# Patient Record
Sex: Female | Born: 1975 | Race: White | Hispanic: No | Marital: Single | Smoking: Former smoker
Health system: Southern US, Community
[De-identification: ages and names within clinical notes are randomized; demographics above are authoritative.]

## PROBLEM LIST (undated history)

## (undated) DIAGNOSIS — N61 Mastitis without abscess: Secondary | ICD-10-CM

## (undated) DIAGNOSIS — E039 Hypothyroidism, unspecified: Secondary | ICD-10-CM

## (undated) DIAGNOSIS — I10 Essential (primary) hypertension: Secondary | ICD-10-CM

## (undated) DIAGNOSIS — F988 Other specified behavioral and emotional disorders with onset usually occurring in childhood and adolescence: Secondary | ICD-10-CM

## (undated) DIAGNOSIS — N6019 Diffuse cystic mastopathy of unspecified breast: Secondary | ICD-10-CM

## (undated) DIAGNOSIS — M1990 Unspecified osteoarthritis, unspecified site: Secondary | ICD-10-CM

## (undated) HISTORY — DX: Diffuse cystic mastopathy of unspecified breast: N60.19

## (undated) HISTORY — DX: Unspecified osteoarthritis, unspecified site: M19.90

---

## 2003-04-25 HISTORY — PX: COLONOSCOPY: SHX174

## 2006-12-04 ENCOUNTER — Observation Stay (HOSPITAL_COMMUNITY): Admission: AD | Admit: 2006-12-04 | Discharge: 2006-12-07 | Payer: Self-pay | Admitting: Internal Medicine

## 2007-04-04 ENCOUNTER — Encounter: Admission: RE | Admit: 2007-04-04 | Discharge: 2007-04-04 | Payer: Self-pay | Admitting: Internal Medicine

## 2007-04-09 ENCOUNTER — Emergency Department (HOSPITAL_COMMUNITY): Admission: EM | Admit: 2007-04-09 | Discharge: 2007-04-09 | Payer: Self-pay | Admitting: Family Medicine

## 2007-05-05 ENCOUNTER — Emergency Department: Payer: Self-pay | Admitting: Emergency Medicine

## 2007-06-21 ENCOUNTER — Ambulatory Visit (HOSPITAL_COMMUNITY): Admission: RE | Admit: 2007-06-21 | Discharge: 2007-06-21 | Payer: Self-pay | Admitting: Internal Medicine

## 2007-11-26 ENCOUNTER — Ambulatory Visit (HOSPITAL_BASED_OUTPATIENT_CLINIC_OR_DEPARTMENT_OTHER): Admission: RE | Admit: 2007-11-26 | Discharge: 2007-11-26 | Payer: Self-pay | Admitting: Internal Medicine

## 2008-01-20 ENCOUNTER — Encounter: Admission: RE | Admit: 2008-01-20 | Discharge: 2008-01-20 | Payer: Self-pay | Admitting: Internal Medicine

## 2008-04-24 DIAGNOSIS — M1990 Unspecified osteoarthritis, unspecified site: Secondary | ICD-10-CM

## 2008-04-24 HISTORY — DX: Unspecified osteoarthritis, unspecified site: M19.90

## 2008-04-24 HISTORY — PX: INCISE AND DRAIN ABCESS: PRO64

## 2008-10-26 ENCOUNTER — Emergency Department (HOSPITAL_BASED_OUTPATIENT_CLINIC_OR_DEPARTMENT_OTHER): Admission: EM | Admit: 2008-10-26 | Discharge: 2008-10-26 | Payer: Self-pay | Admitting: Emergency Medicine

## 2008-10-29 ENCOUNTER — Emergency Department (HOSPITAL_BASED_OUTPATIENT_CLINIC_OR_DEPARTMENT_OTHER): Admission: EM | Admit: 2008-10-29 | Discharge: 2008-10-29 | Payer: Self-pay | Admitting: Emergency Medicine

## 2008-11-26 ENCOUNTER — Ambulatory Visit: Payer: Self-pay | Admitting: Internal Medicine

## 2008-11-29 ENCOUNTER — Ambulatory Visit: Payer: Self-pay | Admitting: Internal Medicine

## 2008-12-10 ENCOUNTER — Ambulatory Visit: Payer: Self-pay | Admitting: Surgery

## 2008-12-16 ENCOUNTER — Ambulatory Visit: Payer: Self-pay | Admitting: Surgery

## 2008-12-17 ENCOUNTER — Ambulatory Visit: Payer: Self-pay | Admitting: Surgery

## 2009-04-06 ENCOUNTER — Inpatient Hospital Stay (HOSPITAL_COMMUNITY): Admission: AD | Admit: 2009-04-06 | Discharge: 2009-04-06 | Payer: Self-pay | Admitting: Obstetrics & Gynecology

## 2009-04-06 ENCOUNTER — Ambulatory Visit: Payer: Self-pay | Admitting: Advanced Practice Midwife

## 2009-08-12 ENCOUNTER — Ambulatory Visit: Payer: Self-pay | Admitting: Surgery

## 2009-10-06 ENCOUNTER — Ambulatory Visit: Payer: Self-pay | Admitting: Diagnostic Radiology

## 2009-10-06 ENCOUNTER — Emergency Department (HOSPITAL_BASED_OUTPATIENT_CLINIC_OR_DEPARTMENT_OTHER): Admission: EM | Admit: 2009-10-06 | Discharge: 2009-10-06 | Payer: Self-pay | Admitting: Emergency Medicine

## 2009-10-28 ENCOUNTER — Ambulatory Visit: Payer: Self-pay | Admitting: Surgery

## 2010-01-16 ENCOUNTER — Emergency Department: Payer: Self-pay | Admitting: Internal Medicine

## 2010-04-24 DIAGNOSIS — N6019 Diffuse cystic mastopathy of unspecified breast: Secondary | ICD-10-CM

## 2010-04-24 HISTORY — PX: BREAST MASS EXCISION: SHX1267

## 2010-04-24 HISTORY — DX: Diffuse cystic mastopathy of unspecified breast: N60.19

## 2010-07-10 LAB — CBC
HCT: 37.6 % (ref 36.0–46.0)
MCHC: 35.1 g/dL (ref 30.0–36.0)
MCV: 94.9 fL (ref 78.0–100.0)
RDW: 11.4 % — ABNORMAL LOW (ref 11.5–15.5)

## 2010-07-10 LAB — DIFFERENTIAL
Basophils Absolute: 0 10*3/uL (ref 0.0–0.1)
Basophils Relative: 0 % (ref 0–1)
Eosinophils Absolute: 0 10*3/uL (ref 0.0–0.7)
Eosinophils Relative: 0 % (ref 0–5)
Lymphocytes Relative: 58 % — ABNORMAL HIGH (ref 12–46)
Lymphs Abs: 5.6 10*3/uL — ABNORMAL HIGH (ref 0.7–4.0)
Monocytes Relative: 8 % (ref 3–12)
Neutro Abs: 3.3 10*3/uL (ref 1.7–7.7)

## 2010-07-10 LAB — BASIC METABOLIC PANEL
BUN: 10 mg/dL (ref 6–23)
CO2: 21 mEq/L (ref 19–32)
Chloride: 110 mEq/L (ref 96–112)
Creatinine, Ser: 0.8 mg/dL (ref 0.4–1.2)
GFR calc non Af Amer: >60 mL/min (ref >60)
Potassium: 4.5 mEq/L (ref 3.5–5.1)

## 2010-07-10 LAB — POCT CARDIAC MARKERS: CKMB, poc: <1 ng/mL — ABNORMAL LOW (ref 1.0–8.0)

## 2010-07-17 ENCOUNTER — Ambulatory Visit: Payer: Self-pay | Admitting: Internal Medicine

## 2010-07-26 LAB — GC/CHLAMYDIA PROBE AMP, GENITAL
Chlamydia, DNA Probe: NEGATIVE
GC Probe Amp, Genital: NEGATIVE

## 2010-07-26 LAB — WET PREP, GENITAL
Clue Cells Wet Prep HPF POC: NONE SEEN
Yeast Wet Prep HPF POC: NONE SEEN

## 2010-07-26 LAB — HCG, QUANTITATIVE, PREGNANCY: hCG, Beta Chain, Quant, S: 12 m[IU]/mL — ABNORMAL HIGH (ref <5)

## 2010-07-26 LAB — URINE MICROSCOPIC-ADD ON

## 2010-07-26 LAB — CBC
Hemoglobin: 14.2 g/dL (ref 12.0–15.0)
RDW: 11.9 % (ref 11.5–15.5)

## 2010-07-26 LAB — URINALYSIS, ROUTINE W REFLEX MICROSCOPIC
Bilirubin Urine: NEGATIVE
pH: 5 (ref 5.0–8.0)

## 2010-08-11 ENCOUNTER — Ambulatory Visit: Payer: Self-pay | Admitting: General Surgery

## 2010-08-29 ENCOUNTER — Ambulatory Visit: Payer: Self-pay | Admitting: General Surgery

## 2010-09-06 NOTE — Discharge Summary (Signed)
Anita Aguirre, Anita Aguirre                 ACCOUNT NO.:  [id number]   MEDICAL RECORD NO.:  [id number]          PATIENT TYPE:  INP   LOCATION:  6740                         FACILITY:  [facility]   PHYSICIAN:  Jackie Plum, M.D.DATE OF BIRTH:  12-30-1975   DATE OF ADMISSION:  12/04/2006  DATE OF DISCHARGE:                               DISCHARGE SUMMARY   DIAGNOSES:  1. Hepatic adenoma.  This is an abdominal  pain thought to be      secondary to irritable bowel syndrome.  2. Obesity.  3. Gastroesophageal reflux disease.  4. Tobacco abuse.   DISCHARGE MEDICATIONS:  1. Prilosec 20 mg 2 tablets daily.  2. Bentyl 20 mg t.i.d. to q.i.d. p.r.n.  3. The patient has been instructed to discontinue her birth-control      pill.  4. Motrin 800 mg t.i.d. p.r.n.   STUDIES:  1. CT scan of the abdomen showed hypervascular left hepatic lesion      measuring 3.5 cm x 17.6 cm x 17.5 cm, without evidence of      cirrhosis, concerning for primary liver neoplasia or focal lymph      node hyperplasia.  This was done on December 04, 2006.  2. 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  The patient is planned for followup repeat MRI in      6 months to document stability.  3. EGD and colonoscopy done by Dr. Bosie Clos on December 06, 2006.  It      was unremarkable, except for vaginal irritation.  No Barrett's      esophagus was found, and per discussions with Dr. Bosie Clos, the      films are going to be reviewed again before this diagnosis is ruled      out in this patient, who has been previously diagnosed with      Barrett's esophagus by her physician.   DISCHARGE LABS:  WBC count 8.4, hemoglobin 12.6, hematocrit 45.6, MCV  92.2, platelet count 321,000.  Alpha-fetoprotein less than 1.3.   CONDITION ON DISCHARGE:  Improved satisfactorily.   REASON FOR ADMISSION:  Abdominal pain.   HISTORY OF  PRESENT ILLNESS:  Anita Aguirre is a 35 year old lady who has  been diagnosed with Barrett's esophagus.  She had been put on proton  pump inhibitor, but apparently had not been taking it because of the  cost.  She has some intermittent diarrhea with abdominal pain.  On  account of worsening and persistent symptoms, the patient was admitted  to the hospital for further evaluation.  On evaluation the patient had a  bowel workup done, and it was felt that her symptoms may be related to  irritable bowel syndrome.  The patient is planned for discharged today  on Prilosec over-the-counter, which she could possibly afford, instead  of Protonix or Nexium.  She is going to be following up with GI medicine  in 3 weeks.  On rounds today, the patient  denies any fever or chills.  Abdominal pain is better.   PHYSICAL EXAMINATION ON DISCHARGE:  VITAL SIGNS:  Her vital signs show a  blood pressure of 90/61.  Pulse 81.  Respirations 16.  Temperature 98.6  degrees F.  O2 sat 97% on room air.  HEENT: unremarkable.  LUNGS:  Clear to auscultation.  CARDIAC:  Regular, no gallops.  ABDOMEN:  Obese, soft, mild upper gastric and left subcostal tenderness.  No rebound tenderness.  EXTREMITIES:  No cyanosis or edema.  CNS:  Nonfocal.   discharged in stable condition. The patient's lab work and images as  well as endoscopy discussed with her.  Total time spent with the patient  in discharge process was more than 40 minutes      Jackie Plum, M.D.  Electronically Signed     GO/MEDQ  D:  12/07/2006  T:  12/07/2006  Job:  [id number]   cc:   Graylin Shiver, M.D.  Shirley Friar, MD

## 2010-09-06 NOTE — Op Note (Signed)
NAME:  Anita Aguirre, Anita Aguirre                 ACCOUNT NO.:  [id number]   MEDICAL RECORD NO.:  [id number]          PATIENT TYPE:  INP   LOCATION:  6740                         FACILITY:  [facility]   PHYSICIAN:  Shirley Friar, MDDATE OF BIRTH:  01-22-1976   DATE OF PROCEDURE:  DATE OF DISCHARGE:                               OPERATIVE REPORT   PROCEDURE:  Upper endoscopy.   INDICATIONS:  Diarrhea, abdominal pain, history of Barrett's.   MEDICATIONS FOR EGD:  100 mcg IV fentanyl, 10 mg IV of Versed.   FINDINGS:  Endoscope was inserted through the oropharynx and esophagus  intubated which was normal in its entirety.  Endoscope was advanced down  to the stomach which revealed normal stomach in the proximal and distal  portion.  Endoscope was advanced into the duodenal bulb and second  portion of the duodenum.  The second portion of the duodenum was normal.  The duodenal bulb had mild scattered areas of erythema consistent with  minimal duodenitis.  Endoscope was withdrawn to confirm the above  findings.   ASSESSMENT:  Minimal duodenitis, otherwise normal EGD.      Shirley Friar, MD  Electronically Signed     VCS/MEDQ  D:  12/06/2006  T:  12/07/2006  Job:  [id number]

## 2010-09-06 NOTE — Consult Note (Signed)
NAME:  Anita Aguirre, Anita Aguirre                 ACCOUNT NO.:  [id number]   MEDICAL RECORD NO.:  [id number]          PATIENT TYPE:  INP   LOCATION:  6740                         FACILITY:  [facility]   PHYSICIAN:  Shirley Friar, MDDATE OF BIRTH:  April 14, 1976   DATE OF CONSULTATION:  DATE OF DISCHARGE:                                 CONSULTATION   We were asked to see Anita Aguirre today in consultation for abdominal pain  and liver lesion on CT by Dr. Julio Sicks.   HISTORY OF PRESENT ILLNESS:  This is a 35 year old female with a history  of GERD and Barrett's esophagus, who is not on PPI therapy.  She was  found to have a 1.7 x 1.3-cm liver lesion concerning for neoplasm on CT.  The patient describes:  1. Diarrhea that is light brown in color x1 week.  It that appears to      have resolved, since she has been in the hospital.  She believes it      is resolved, due to her pain medications.  2. She describes as sharp pinching epigastric and left upper quadrant      pain that has been intermittent for a long time but has become      constant for the past 1 week.  She states that ranitidine sometimes      eases this pain.  3. Abdominal bloating after eating.   PAST MEDICAL HISTORY:  1. Barrett's esophagus diagnosed in 2004 by a Mining engineer.  The      patient is now a patient of Dr. Wandalee Ferdinand and was to be scheduled      for an endoscopy/colonoscopy next week.  2. Anxiety.  3. Obesity.  4. Tobacco abuse.   PRIMARY CARE PHYSICIAN:  Dr. Greggory Stallion Osei-Bonsu.   CURRENT MEDICATIONS:  Yasmin birth control pills.  She reports that she  has no NSAID exposure.   ALLERGIES:  SHE HAS ALLERGIES TO WELLBUTRIN WHICH CAUSES A RASH.   REVIEW OF SYSTEMS:  Significant for recent decrease in appetite but no  weight loss or fever.   SOCIAL HISTORY:  Positive for tobacco use, approximately a pack a day  for 15 years, negative for alcohol and drug use.   FAMILY HISTORY:  Is positive for liver cancer in her  maternal  grandmother, who was diagnosed at 54 years old and is still alive at 2.  Also positive for gallbladder disease in many relatives and positive for  cervical cancer in one cousin.   PHYSICAL EXAM:  GENERAL:  She is alert and oriented, in no apparent  distress.  SKIN:  Shows no jaundice.  HEENT:  Her eyes show no icterus.  HEART:  Has a regular rate and rhythm.  LUNGS:  Clear to auscultation.  ABDOMEN:  Obese but soft, tender in the left upper quadrant and  epigastrium.  She has good bowel sounds.   LABORATORY:  Her labs show a hemoglobin 12.8, hematocrit 36.6, white  count 9.3, platelets 331,000.  Chem-7 shows a BUN of 6, creatinine 0.84,  albumin 3.1.  Liver function tests are normal.  CT done yesterday shows  a liver lesion that is 1.7 x 1.3 cm on her left hepatic lobe.  It is  solitary and near the left portal vein.  Gallbladder on CT is normal.   ASSESSMENT:  Dr. Charlott Rakes has seen and examined the patient,  collected a history.  His impression is that this is a 35 year old  female with Barrett's esophagus, who is not on PPI therapy and who has  continued to smoke.  She is on Protonix b.i.d., now that she is in the  hospital.  She also has a solitary liver lesion on her left hepatic  lobe. for which an MRI is scheduled tomorrow.   PLAN:  Will schedule upper endoscopy on August 14.  Thank you very much  for this consultation.      Stephani Police, [place]      Shirley Friar, MD  Electronically Signed    MLY/MEDQ  D:  12/05/2006  T:  12/06/2006  Job:  [id number]   cc:   Jackie Plum, M.D.  Shirley Friar, MD

## 2010-09-06 NOTE — Op Note (Signed)
NAME:  Anita Aguirre, Anita Aguirre                 ACCOUNT NO.:  [id number]   MEDICAL RECORD NO.:  [id number]          PATIENT TYPE:  INP   LOCATION:  6740                         FACILITY:  [facility]   PHYSICIAN:  Shirley Friar, MDDATE OF BIRTH:  Jul 05, 1975   DATE OF PROCEDURE:  DATE OF DISCHARGE:                               OPERATIVE REPORT   PROCEDURE PERFORMED:  Colonoscopy.   INDICATION:  Diarrhea, abdominal pain.   MEDICATIONS:  Fentanyl 150 mcg IV, Versed 12.5 mg IV, additional  medicine given for preceding upper endoscopy.   FINDINGS:  Rectal exam was normal.  An adult Pentax colonoscope was  inserted into an adequately prepped colon and advanced to the cecum  where the ileocecal valve and appendiceal orifice were identified.  Careful withdrawal of the colonoscope revealed no mucosal abnormalities.  Retroflexion was unremarkable.   ASSESSMENT:  Normal colonoscopy.   PLAN:  Advance diet as tolerated.      Shirley Friar, MD  Electronically Signed     VCS/MEDQ  D:  12/06/2006  T:  12/07/2006  Job:  [id number]   cc:   Graylin Shiver, M.D.

## 2011-02-06 LAB — COMPREHENSIVE METABOLIC PANEL
Alkaline Phosphatase: 26 — ABNORMAL LOW
BUN: 6
CO2: 25
Chloride: 110
Creatinine, Ser: 0.84
GFR calc non Af Amer: >60
Glucose, Bld: 103 — ABNORMAL HIGH
Potassium: 4.2
Total Bilirubin: 0.6

## 2011-02-06 LAB — CBC
HCT: 36.6
HCT: 38.2
Hemoglobin: 12.8
Hemoglobin: 13.5
MCHC: 34.9
MCHC: 35.3
MCV: 92.2
MCV: 92.8
MCV: 92.9
Platelets: 321
Platelets: 331
Platelets: 343
RBC: 3.86 — ABNORMAL LOW
RBC: 3.94
RBC: 4.11
RDW: 11.5
RDW: 11.8
WBC: 8.4
WBC: 9.1
WBC: 9.3

## 2011-02-06 LAB — COMPREHENSIVE METABOLIC PANEL WITH GFR
ALT: 16
AST: 19
Albumin: 3.4 — ABNORMAL LOW
Alkaline Phosphatase: 26 — ABNORMAL LOW
BUN: 6
CO2: 24
Calcium: 9.2
Chloride: 110
Creatinine, Ser: 0.9
GFR calc non Af Amer: >60
Glucose, Bld: 89
Potassium: 3.8
Sodium: 140
Total Bilirubin: 0.2 — ABNORMAL LOW
Total Protein: 6.7

## 2011-02-06 LAB — PROTIME-INR
INR: 1
Prothrombin Time: 13.4

## 2011-02-06 LAB — APTT: aPTT: 29

## 2011-02-06 LAB — AFP TUMOR MARKER: AFP-Tumor Marker: <1.3

## 2011-04-25 HISTORY — PX: MASTECTOMY, PARTIAL: SHX709

## 2011-08-28 ENCOUNTER — Ambulatory Visit: Payer: Self-pay | Admitting: General Surgery

## 2011-08-30 ENCOUNTER — Ambulatory Visit: Payer: Self-pay | Admitting: General Surgery

## 2011-09-26 ENCOUNTER — Ambulatory Visit: Payer: Self-pay | Admitting: General Surgery

## 2011-09-29 ENCOUNTER — Ambulatory Visit: Payer: Self-pay | Admitting: Family Medicine

## 2011-11-27 ENCOUNTER — Emergency Department: Payer: Self-pay | Admitting: Emergency Medicine

## 2011-11-27 LAB — CBC WITH DIFFERENTIAL/PLATELET
Basophil %: 0.5 %
Eosinophil %: 2.5 %
HCT: 40.7 % (ref 35.0–47.0)
MCV: 94 fL (ref 80–100)
Monocyte %: 6.5 %
RDW: 12.7 % (ref 11.5–14.5)
WBC: 9.9 10*3/uL (ref 3.6–11.0)

## 2011-11-27 LAB — COMPREHENSIVE METABOLIC PANEL
Anion Gap: 8 (ref 7–16)
Calcium, Total: 9.5 mg/dL (ref 8.5–10.1)
Chloride: 112 mmol/L — ABNORMAL HIGH (ref 98–107)
Creatinine: 0.82 mg/dL (ref 0.60–1.30)
EGFR (African American): >60
Glucose: 92 mg/dL (ref 65–99)
Potassium: 4.4 mmol/L (ref 3.5–5.1)
SGOT(AST): 26 U/L (ref 15–37)

## 2011-11-27 LAB — URINALYSIS, COMPLETE
Bilirubin,UR: NEGATIVE
Glucose,UR: NEGATIVE mg/dL (ref 0–75)
Ketone: NEGATIVE
RBC,UR: 1 /HPF (ref 0–5)
Squamous Epithelial: 3
WBC UR: 6 /HPF (ref 0–5)

## 2011-11-27 LAB — HCG, QUANTITATIVE, PREGNANCY: Beta Hcg, Quant.: 15160 m[IU]/mL — ABNORMAL HIGH

## 2011-11-27 LAB — WET PREP, GENITAL

## 2011-11-27 LAB — LIPASE, BLOOD: Lipase: 169 U/L (ref 73–393)

## 2011-11-29 ENCOUNTER — Emergency Department: Payer: Self-pay | Admitting: Emergency Medicine

## 2011-11-29 LAB — CBC
HGB: 14.2 g/dL (ref 12.0–16.0)
Platelet: 314 10*3/uL (ref 150–440)
RBC: 4.31 10*6/uL (ref 3.80–5.20)
WBC: 12.5 10*3/uL — ABNORMAL HIGH (ref 3.6–11.0)

## 2011-12-15 ENCOUNTER — Emergency Department: Payer: Self-pay | Admitting: Internal Medicine

## 2011-12-15 LAB — URINALYSIS, COMPLETE
Bilirubin,UR: NEGATIVE
Ketone: NEGATIVE
RBC,UR: 728 /HPF (ref 0–5)
Specific Gravity: 1.013 (ref 1.003–1.030)
Squamous Epithelial: 5
Transitional Epi: <1
WBC UR: 22 /HPF (ref 0–5)

## 2011-12-15 LAB — CBC
HCT: 37.8 % (ref 35.0–47.0)
MCH: 32.4 pg (ref 26.0–34.0)
MCV: 94 fL (ref 80–100)
RBC: 4 10*6/uL (ref 3.80–5.20)
RDW: 12.7 % (ref 11.5–14.5)
WBC: 11.5 10*3/uL — ABNORMAL HIGH (ref 3.6–11.0)

## 2011-12-23 ENCOUNTER — Emergency Department: Payer: Self-pay | Admitting: Emergency Medicine

## 2011-12-23 LAB — HCG, QUANTITATIVE, PREGNANCY: Beta Hcg, Quant.: 62632 m[IU]/mL — ABNORMAL HIGH

## 2011-12-23 LAB — BASIC METABOLIC PANEL
Chloride: 109 mmol/L — ABNORMAL HIGH (ref 98–107)
Co2: 20 mmol/L — ABNORMAL LOW (ref 21–32)
Osmolality: 277 (ref 275–301)
Potassium: 3.9 mmol/L (ref 3.5–5.1)

## 2011-12-23 LAB — CBC
MCH: 32.8 pg (ref 26.0–34.0)
MCHC: 35 g/dL (ref 32.0–36.0)
Platelet: 356 10*3/uL (ref 150–440)
RBC: 3.63 10*6/uL — ABNORMAL LOW (ref 3.80–5.20)

## 2011-12-26 ENCOUNTER — Observation Stay: Payer: Self-pay | Admitting: Obstetrics and Gynecology

## 2011-12-26 LAB — CBC WITH DIFFERENTIAL/PLATELET
Lymphocyte #: 3.2 10*3/uL (ref 1.0–3.6)
MCH: 33.8 pg (ref 26.0–34.0)
MCV: 96 fL (ref 80–100)
Neutrophil %: 73.4 %
Platelet: 337 10*3/uL (ref 150–440)
RDW: 13.2 % (ref 11.5–14.5)
WBC: 17.2 10*3/uL — ABNORMAL HIGH (ref 3.6–11.0)

## 2011-12-29 LAB — PATHOLOGY REPORT

## 2012-01-05 ENCOUNTER — Ambulatory Visit: Payer: Self-pay | Admitting: Family Medicine

## 2012-01-11 ENCOUNTER — Ambulatory Visit: Payer: Self-pay | Admitting: Family Medicine

## 2012-01-16 DIAGNOSIS — F4310 Post-traumatic stress disorder, unspecified: Secondary | ICD-10-CM | POA: Insufficient documentation

## 2012-01-18 ENCOUNTER — Ambulatory Visit: Payer: Self-pay | Admitting: Family Medicine

## 2012-02-08 ENCOUNTER — Ambulatory Visit: Payer: Self-pay | Admitting: Family Medicine

## 2012-05-19 ENCOUNTER — Ambulatory Visit: Payer: Self-pay

## 2012-05-19 LAB — COMPREHENSIVE METABOLIC PANEL
Anion Gap: 12 (ref 7–16)
BUN: 8 mg/dL (ref 7–18)
Bilirubin,Total: 0.3 mg/dL (ref 0.2–1.0)
Calcium, Total: 9.3 mg/dL (ref 8.5–10.1)
Chloride: 105 mmol/L (ref 98–107)
Glucose: 94 mg/dL (ref 65–99)
Osmolality: 283 (ref 275–301)
Potassium: 3.8 mmol/L (ref 3.5–5.1)
SGPT (ALT): 22 U/L (ref 12–78)
Sodium: 143 mmol/L (ref 136–145)

## 2012-05-19 LAB — CBC WITH DIFFERENTIAL/PLATELET
Basophil #: 0.1 10*3/uL (ref 0.0–0.1)
Eosinophil #: 0.3 10*3/uL (ref 0.0–0.7)
HGB: 12.9 g/dL (ref 12.0–16.0)
Lymphocyte #: 2.8 10*3/uL (ref 1.0–3.6)
Neutrophil %: 64.4 %
RBC: 4.09 10*6/uL (ref 3.80–5.20)

## 2012-05-19 LAB — URINALYSIS, COMPLETE
Glucose,UR: NEGATIVE mg/dL (ref 0–75)
Ketone: NEGATIVE
Ph: 6.5 (ref 4.5–8.0)

## 2012-06-28 ENCOUNTER — Encounter: Payer: Self-pay | Admitting: *Deleted

## 2012-07-20 ENCOUNTER — Encounter: Payer: Self-pay | Admitting: General Surgery

## 2012-09-05 IMAGING — US US OB < 14 WEEKS - US OB TV
1 series · 13 of 28 positions shown · non-contrast
Comparison: none

REASON FOR EXAM: pt in severe pain, left pelvis - rule out ectopic -
report of 10 wks pregnanct
COMMENTS:

[Series 1: us ob < 14 weeks - us ob tv · 0.31mm/px · 78 acquisitions, 13 frames shown]
[im 3/78]
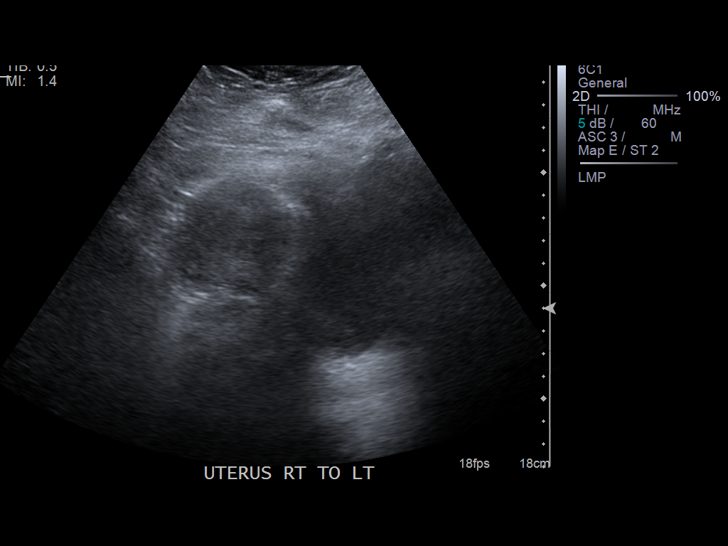
[im 9/78]
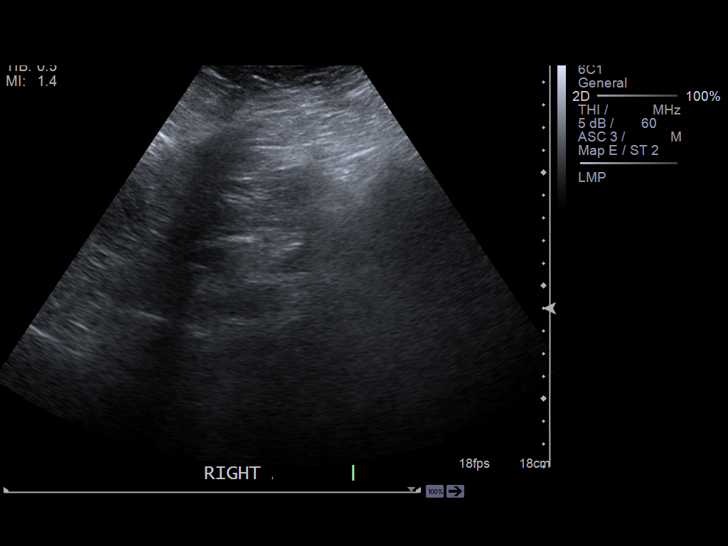
[im 15/78]
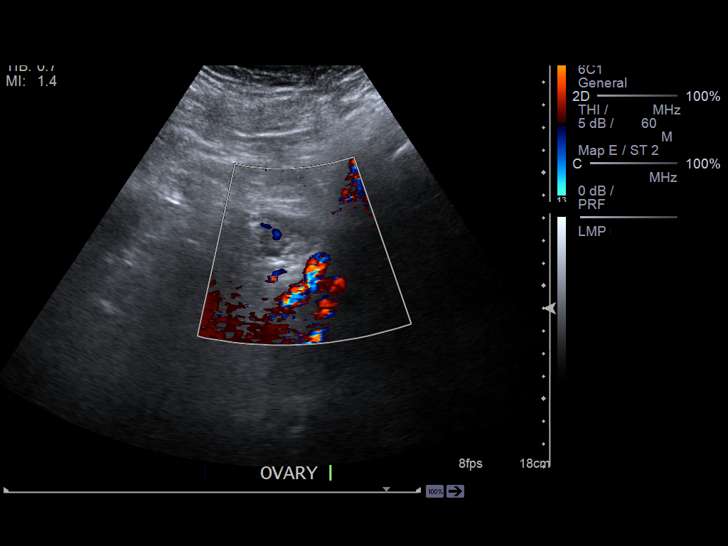
[im 20/78]
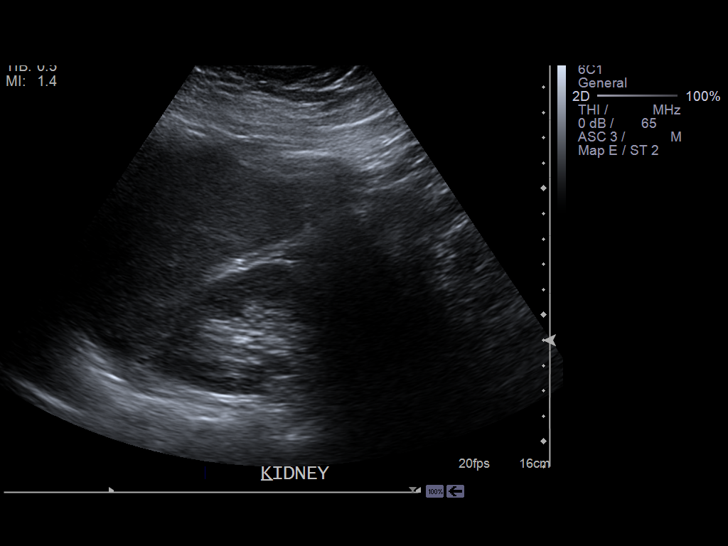
[im 26/78]
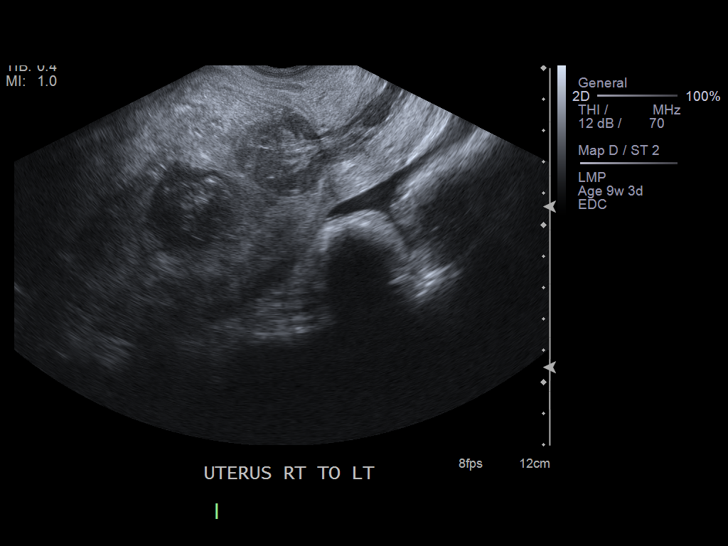
[im 32/78]
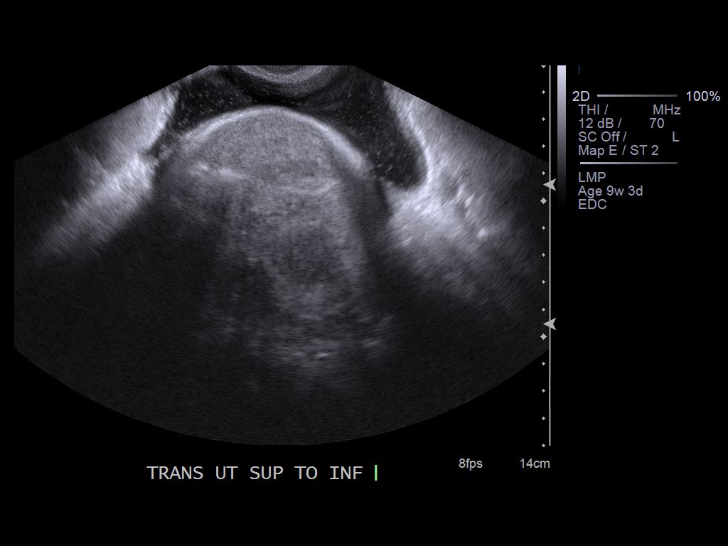
[im 40/78]
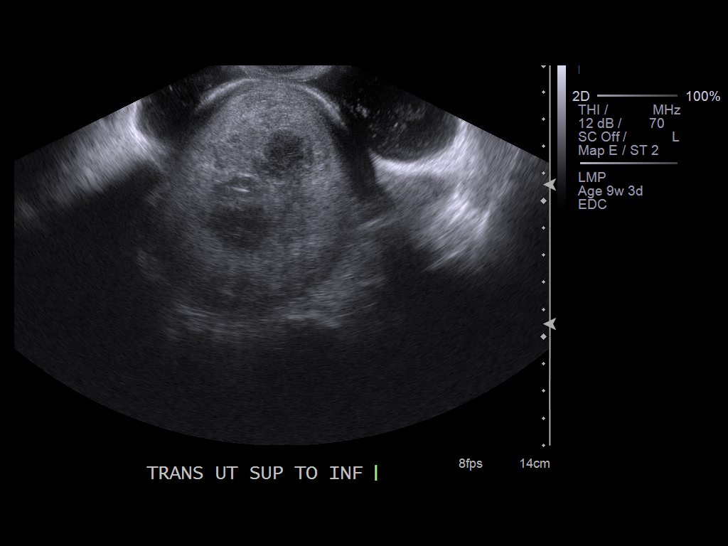
[im 46/78]
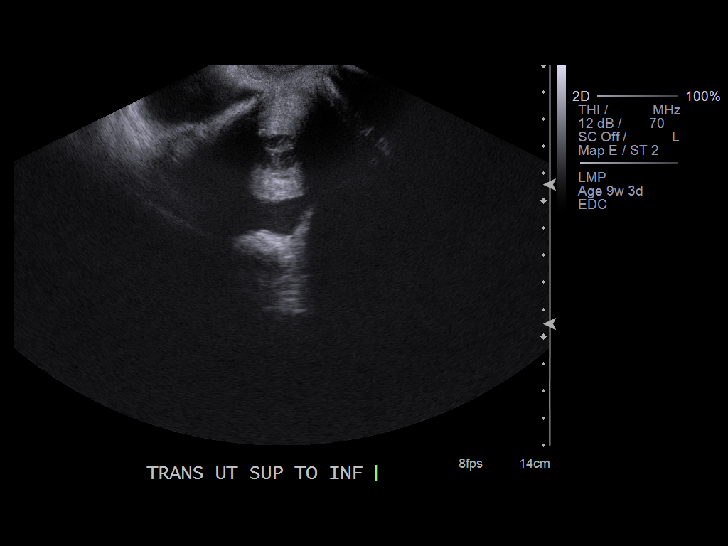
[im 52/78]
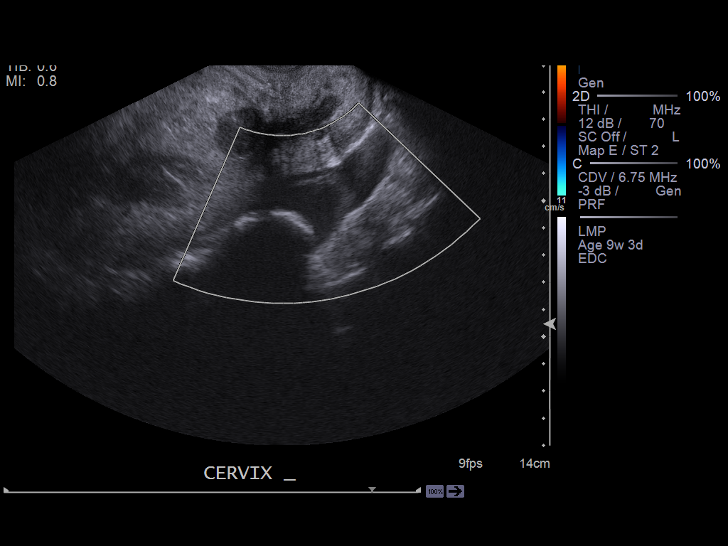
[im 58/78]
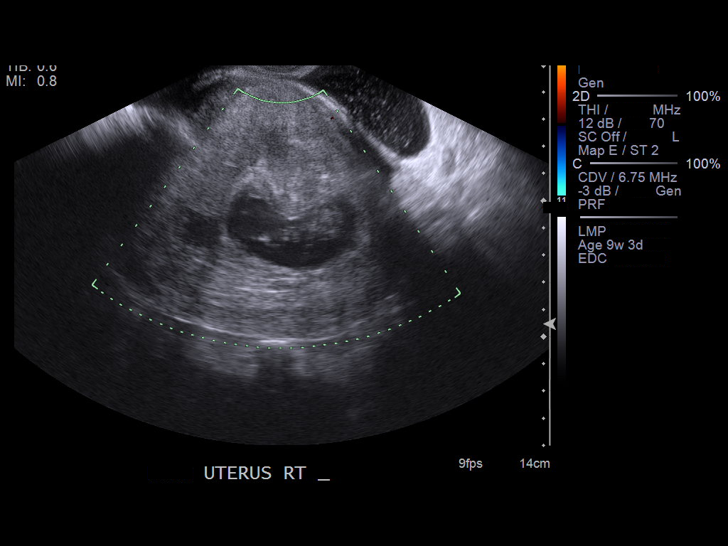
[im 63/78]
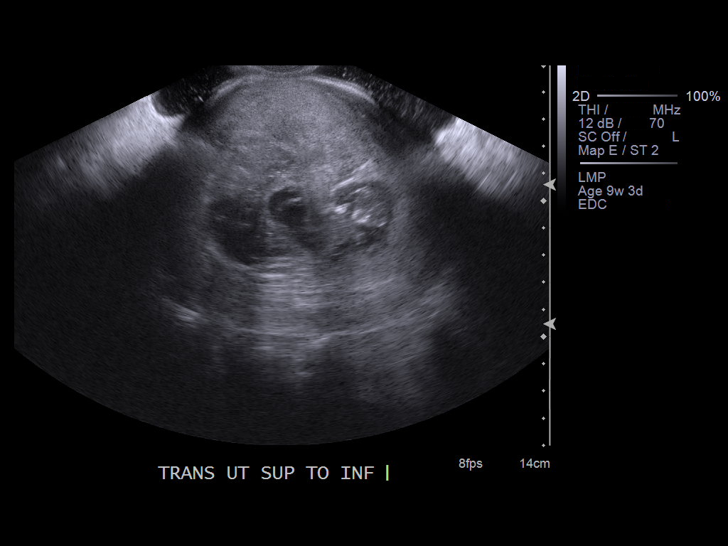
[im 69/78]
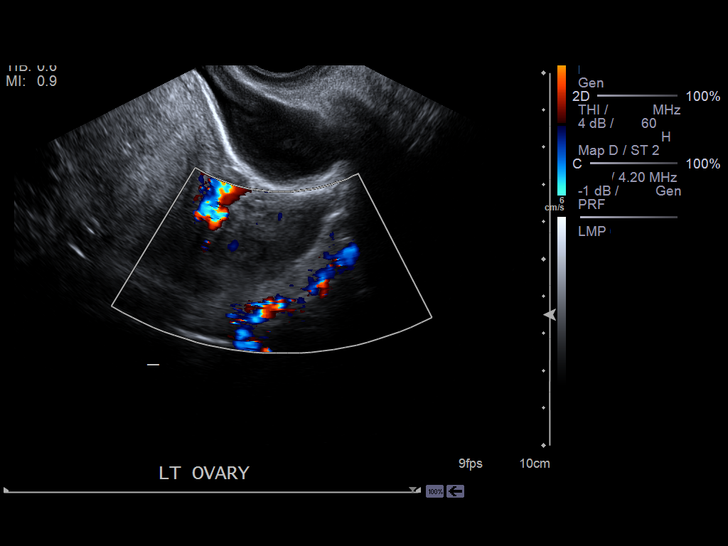
[im 75/78]
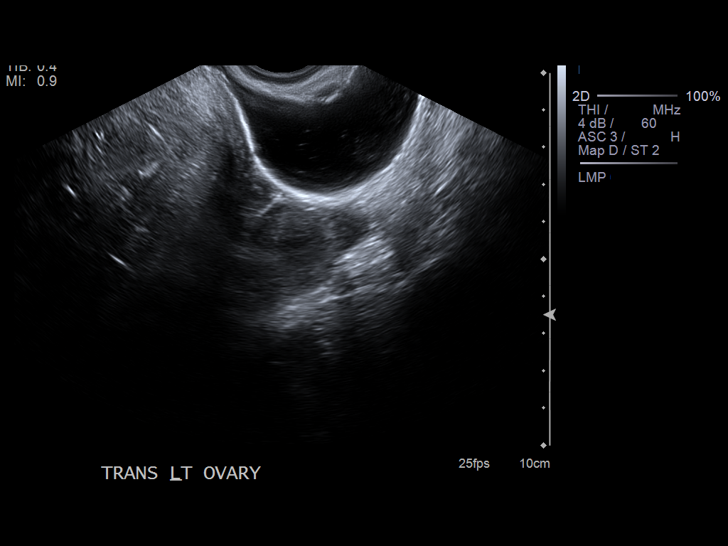

[13 of 28 positions shown; findings below may reference images not displayed]

PROCEDURE:     US  - US OB LESS THAN 14 WEEKS/W TRANS  - December 23, 2011  [DATE]

RESULT:     Doppler interrogation utilizing an OB protocol demonstrates an
intrauterine gestational sac with a single fetal pole. Left ovary measures
3.39 x 2.09 x 2.71 cm. The uterus measures 13.67 x 9.20 x 10.66 cm. Drains
vaginal images show the left ovary 3.30 x 2.37 x 4.61 cm and shows blood
flow present on color and spectral Doppler with arterial and venous signals
demonstrated. There is a small cyst or follicle in the left ovary which is
not measured. Fetal activity is seen the heart rate of 157 beats per minute.
On the right side of the endometrial cavity toward the cervix there is a
4.02 x 2.62 x 3.25 cm complex collection a separate area is seen and
measures 2.88 x 2.54 x 3.06 cm. The right ovary is not visualized. The
maternal kidneys show no obstruction or stones. There is no evidence of an
ectopic gestation. No uterine mass is appreciated. The previous examination
performed on 27 November, 2011 showed a single intrauterine gestation. The left
ovarian cystic area is unchanged. The report previously erroneously said
this was on the right. Right ovary is not seen on today's exam.
IMPRESSION: 1. Single intrauterine gestation measuring 10 weeks 2 days by fetal
measurements with an ultrasound EDC 18 July, 2012 and a current fetal
heart rate of 157 beats per minute. Stable left ovarian cyst.
2. Two complex areas of predominantly decreased but mixed echotexture along
the right side of the endometrial cavity extending toward the cervix. These
are nonspecific and could represent areas of hemorrhage. Nonviable previous
gestations are not completely excluded but there is no previous viable
gestation in these regions on previous exam. Followup is recommended,
possibly a high risk OB clinic.

[REDACTED]

## 2012-09-12 ENCOUNTER — Ambulatory Visit: Payer: Self-pay | Admitting: General Surgery

## 2012-09-19 ENCOUNTER — Encounter: Payer: Self-pay | Admitting: *Deleted

## 2013-02-21 IMAGING — US ABDOMEN ULTRASOUND
1 series · 14 of 25 positions shown · non-contrast
Comparison: none

REASON FOR EXAM: Abn CT pancreas or bile duct enlargement
COMMENTS:

[Series 1: abdomen ultrasound · 0.30mm/px · 14 of 87 slices shown]
[im 1/87]
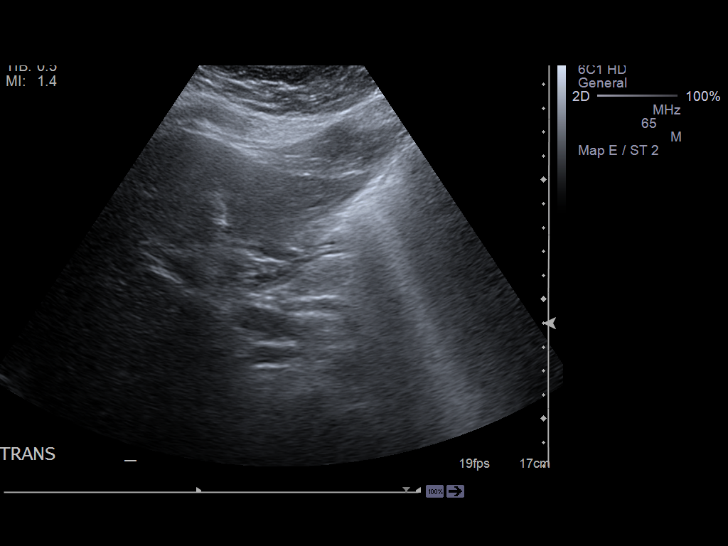
[im 8/87]
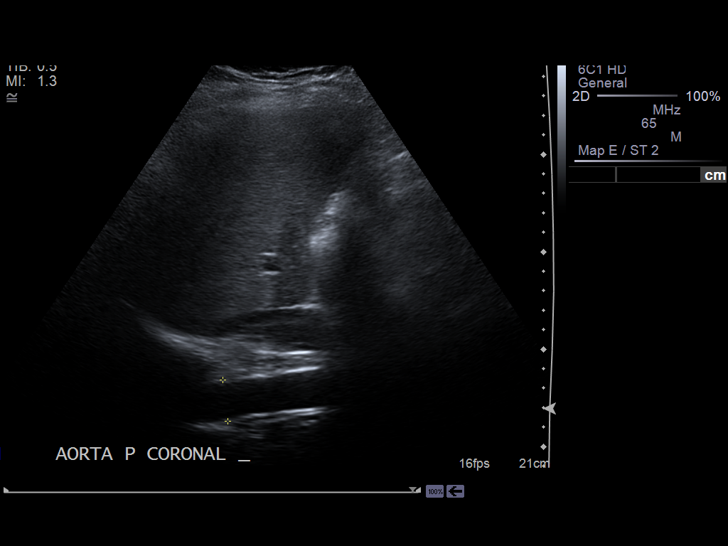
[im 15/87]
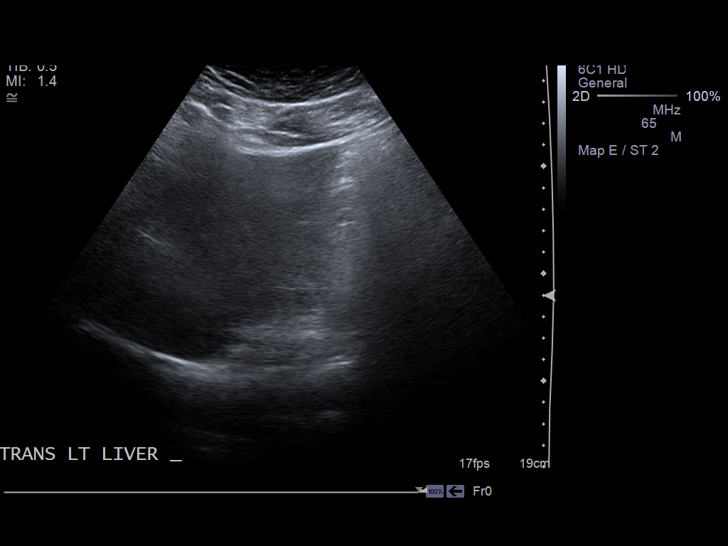
[im 22/87]
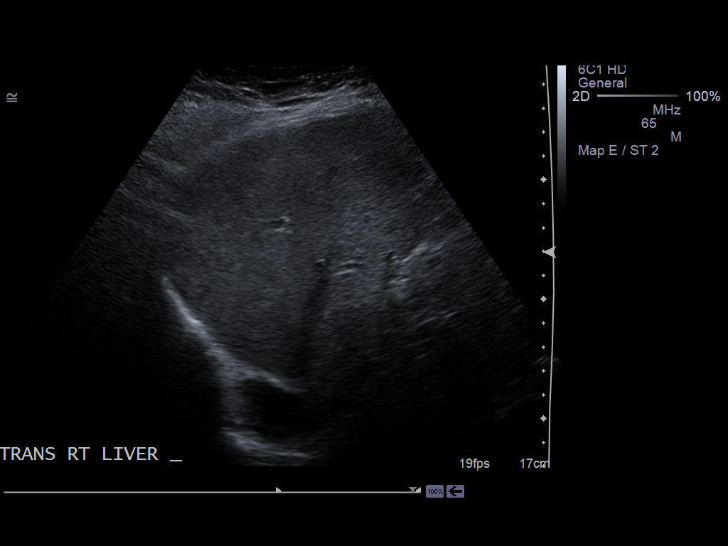
[im 29/87]
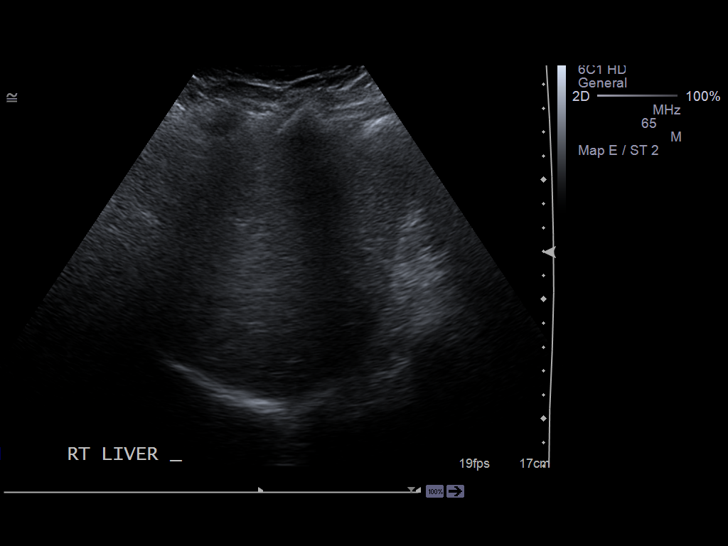
[im 33/87]
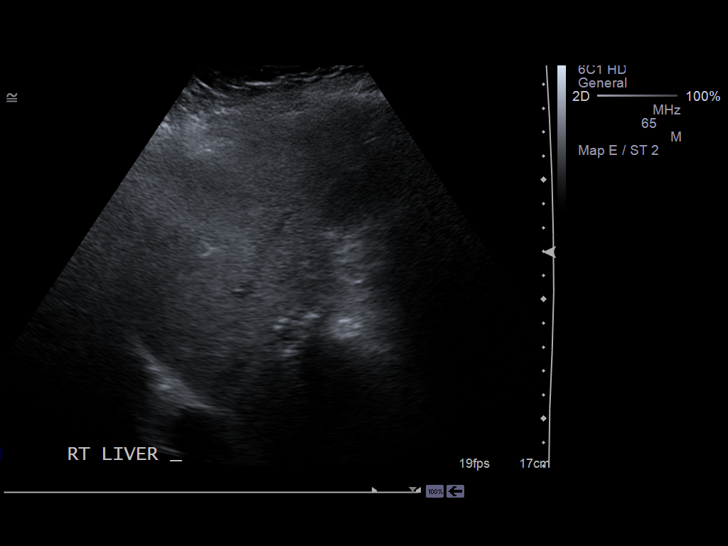
[im 40/87]
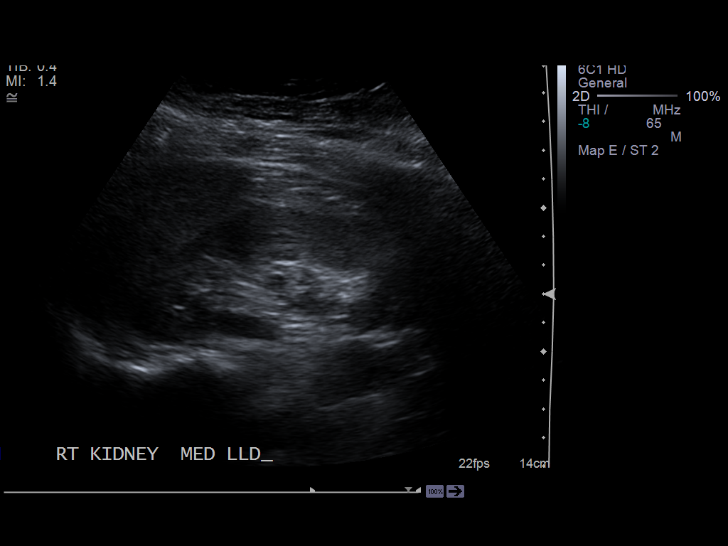
[im 47/87]
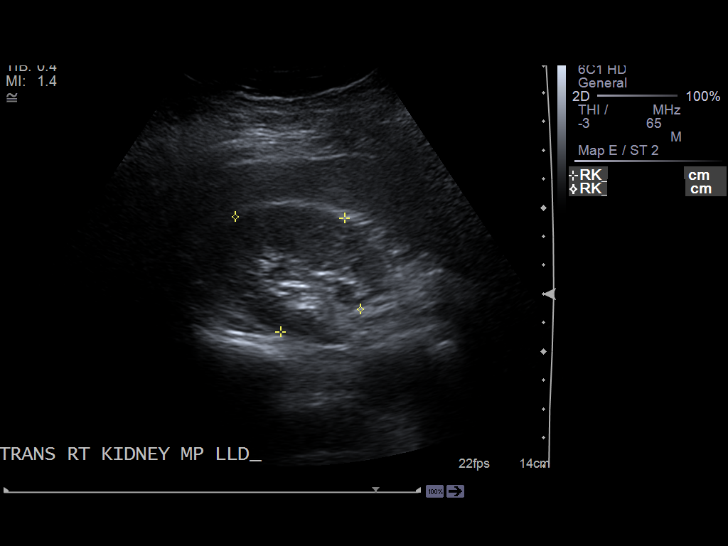
[im 54/87]
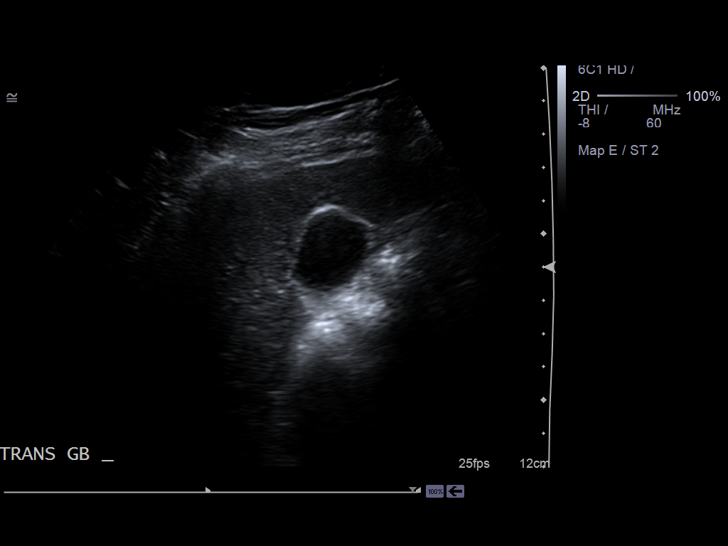
[im 58/87]
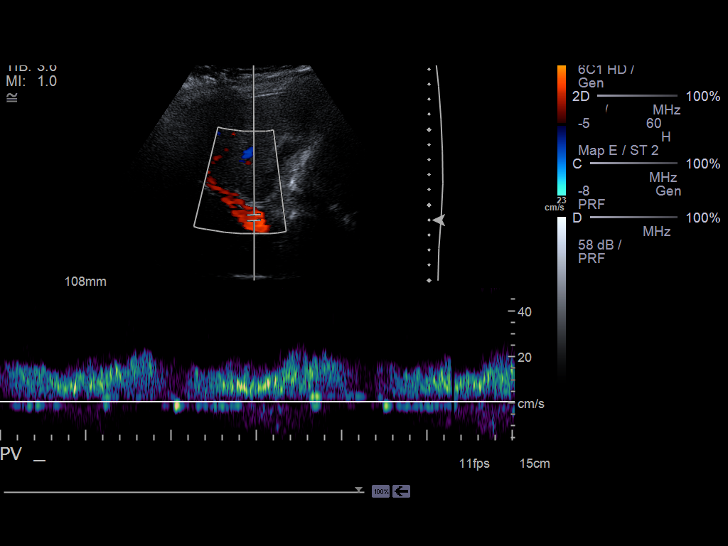
[im 65/87]
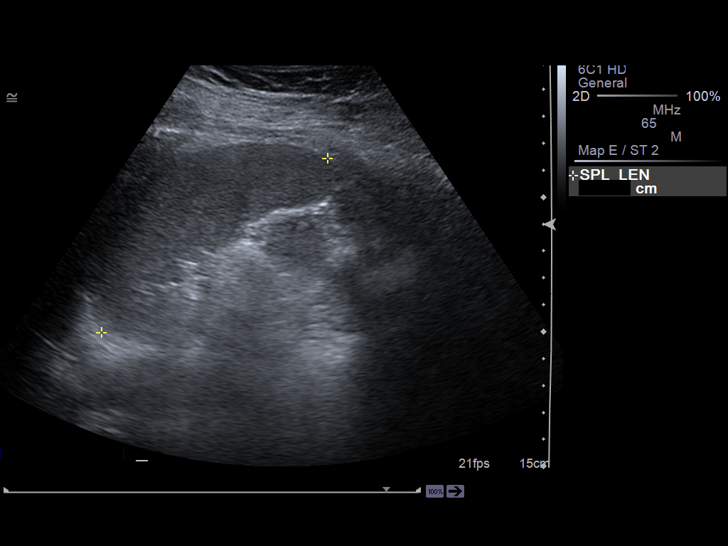
[im 72/87]
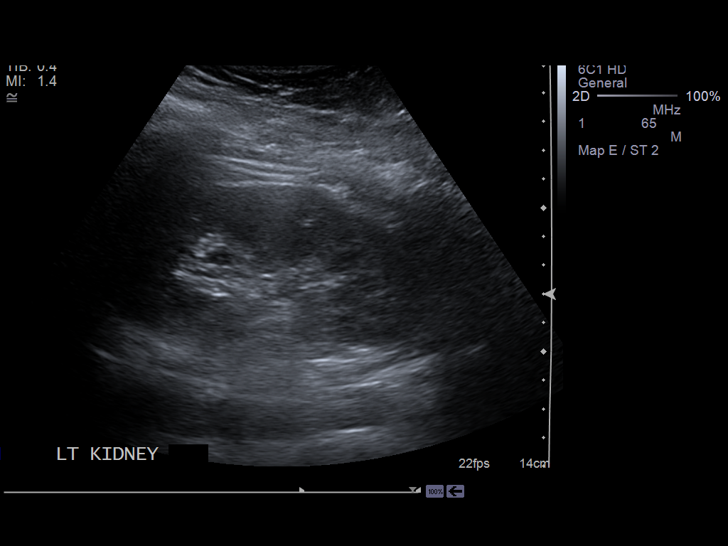
[im 79/87]
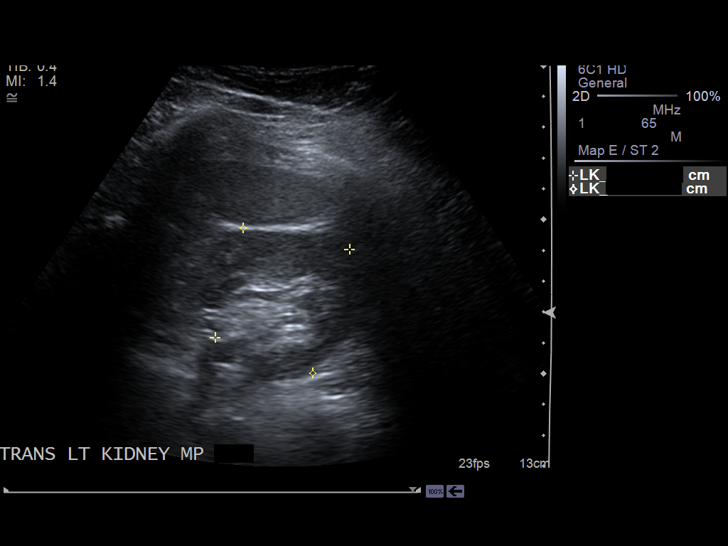
[im 87/87]
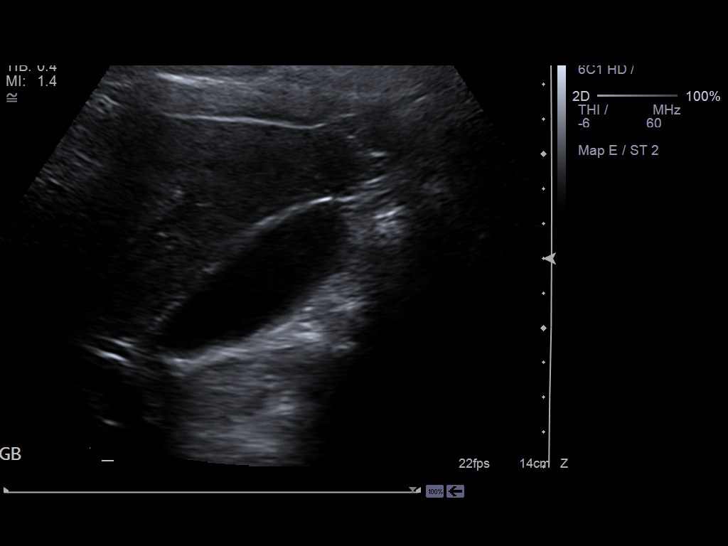

[14 of 25 positions shown; findings below may reference images not displayed]

PROCEDURE:     US  - US ABDOMEN GENERAL SURVEY  - January 11, 2012 [DATE]

RESULT:     Abdominal sonogram is performed. The pancreas is poorly seen.
The visualized aorta proximally appears normal. The mid to distal aorta is
normal in appearance. The midportion is not as well seen as the proximal and
distal portions. The liver shows some mild heterogeneity without a discrete
mass. The liver length is 15.10 cm. Portal venous flow is normal. No
gallstones are evident. There is no sonographic Murphy's sign. The
gallbladder wall thickness is 2.1 mm. Common bile duct diameter is 4.5 mm.
The inferior vena cava is unremarkable. The kidneys appear normal. The right
kidney measures 12.22 x 4.54 x 5.41 cm. The left kidney measures 11.92 x
5.22 x 5.21 cm. The spleen measures up to 10.61 cm.
IMPRESSION: Normal-appearing abdominal sonogram with limited visualization of the
pancreas. Mild fatty infiltration is noted in the liver.

[REDACTED]

## 2013-04-10 ENCOUNTER — Emergency Department: Payer: Self-pay | Admitting: Emergency Medicine

## 2013-04-10 ENCOUNTER — Ambulatory Visit: Payer: Self-pay

## 2014-02-23 ENCOUNTER — Encounter: Payer: Self-pay | Admitting: *Deleted

## 2014-08-11 NOTE — H&P (Signed)
PATIENT NAME:  Anita Aguirre, Anita Aguirre MR#:  [id number] DATE OF BIRTH:  [birth year]  DATE OF ADMISSION:  12/26/2011  CHIEF COMPLAINT: Incomplete abortion.   HISTORY: Anita Aguirre is a 39 year old white female gravida 3, para 0-0-2-0, who had a spontaneous abortion with passage of a fetus 40 hours ago, presents now with anemia symptoms, persistent mild bleeding, and pelvic pain. It is suspected that she has an incomplete abortion and will likely need dilation and curettage. CBC earlier today revealed a hemoglobin of 8.2 and a hematocrit of 24.3.   PAST MEDICAL HISTORY: Hypertension.   PAST SURGICAL HISTORY:  1. Partial mastectomy.  2. Breast biopsy x3 for chronic abscess.   PAST OB HISTORY: Para 0-0-2-0, SAB x1, TAB x1.   FAMILY HISTORY: Negative for cancer of the breast, colon or ovary. No family history of genetic disorders.   SOCIAL HISTORY: Patient denies alcohol use. She is a smoker, 1 pack of cigarettes a day for 20 years. She denies drug use.   DRUG ALLERGIES: Zyban.   CURRENT MEDICATIONS:  1. Prenatal vitamins. 2. Methergine. 3. Ibuprofen. 4. Vicodin.   PHYSICAL EXAMINATION:  VITAL SIGNS: Height 5 feet 7 inches, weight 212.9 pounds. Blood pressure 121/80, heart rate 92.   GENERAL: Patient is a tearful white female who is alert and oriented. She has overall pale appearance.   SKIN: Warm and dry. Nail beds show fair capillary refill.   NECK: Supple. There is no thyromegaly or adenopathy.   LUNGS: Clear.   HEART: Mild tachycardia with no murmur. No S3 or S4.   ABDOMEN: Soft and nontender. There is no organomegaly.   PELVIC: External genitalia with some old blood on the perineum. BUS normal. Vagina has some burgundy clot in the vault. The cervix is open to ring forceps and small amount of clot is removed. Bimanual exam reveals a 12 week size uterus that is slightly tender. No adnexal masses are appreciated.   EXTREMITIES: Warm and dry. No clubbing, cyanosis, or edema.    IMPRESSION:  1. Incomplete abortion.  2. Anemia from acute blood loss from miscarriage.   PLAN: Admission to hospital for observation with subsequent dilation and curettage to be done in the a.m.    CONSENT NOTE: Patient has been counseled regarding the suction dilatation and curettage procedure. She is understanding of the planned procedure and is aware of and is accepting of all surgical risks which include but are not limited to bleeding, infection, pelvic organ injury with need for repair, blood clot disorders, anesthesia risks, uterine perforation, death. All questions are answered. Informed consent is given. Patient is ready and willing to proceed with the surgery as scheduled. ____________________________ Alanda Slim Reyes Fifield, MD mad:cms D: 12/26/2011 22:21:04 ET T: 12/27/2011 08:34:15 ET  JOB#: [id number] Hassell Done A Yolando Gillum MD ELECTRONICALLY SIGNED 12/28/2011 13:45

## 2014-08-11 NOTE — Op Note (Signed)
PATIENT NAME:  Anita Aguirre, Anita Aguirre MR#:  [id number] DATE OF BIRTH:  1975/04/28  DATE OF PROCEDURE:  12/27/2011  PREOPERATIVE DIAGNOSES:  1. Incomplete abortion.  2. Anemia.   POSTOPERATIVE DIAGNOSES:  1. Incomplete abortion.  2. Anemia.   OPERATIVE PROCEDURE: Suction D and C.   SURGEON: Makailyn Mccormick A. Khloe Hunkele, MD   FIRST ASSISTANT: None.   ANESTHESIA: General.   INDICATIONS: The patient is a 39 year old white female gravida 3 para 0-0-2-0, at 10+ weeks gestation who presents for surgical management of incomplete abortion. The patient was seen in the ER over the weekend and had passage of fetal tissue. She had persistent bleeding and cramping with development of anemia with a hematocrit of 23.2. The patient was counseled to undergo suction D and C.   FINDINGS AT SURGERY: Findings at surgery revealed an open cervical os. There was POC noted at the os. Uterus was 12 weeks size.   DESCRIPTION OF PROCEDURE: The patient was brought to the operating room where she was placed in the supine position. General anesthesia was induced without difficulty. She was placed in the dorsal lithotomy position using candy-cane stirrups. A Betadine perineal, intravaginal prep and drape was performed in the standard fashion. A red Robinson catheter was used to drain 200 mL of urine from the bladder. A weighted speculum was placed into the vagina and a single-tooth tenaculum was placed on the anterior lip of the cervix. The uterus sounded to 12 cm. A #12 suction curette was used after removal of the POC from the os with ring forceps to perform a curettage. Three passes were made with minimal residual tissue removed. A serrated curette was used to verify that no significant residual tissue was left behind. One final pass with the suction curette was made. Procedure was then terminated with all instrumentation being removed from the vagina. The patient was then awakened, mobilized, and taken to the recovery room in  satisfactory condition.   ESTIMATED BLOOD LOSS: Minimal.   COMPLICATIONS: None.   All instruments, needle, and sponge counts were verified as correct.   ____________________________ Alanda Slim. Kairyn Olmeda, MD mad:drc D: 12/27/2011 07:03:41 ET T: 12/27/2011 10:50:23 ET JOB#: [id number]  cc: Hassell Done A. Zlata Alcaide, MD, <Dictator> Alanda Slim Keliyah Spillman MD ELECTRONICALLY SIGNED 12/28/2011 13:45

## 2014-08-16 NOTE — Op Note (Signed)
PATIENT NAME:  Anita Aguirre, BUCH MR#:  [id number] DATE OF BIRTH:  [birth year]  DATE OF PROCEDURE:  09/26/2011  PREOPERATIVE DIAGNOSIS: Left breast mass.   POSTOPERATIVE DIAGNOSIS: Left breast mass.   OPERATION: Excision of left breast mass with ultrasound-guided wire localization.   SURGEON: Mckinley Jewel, MD    ANESTHESIA: Local with a mixture of 0.5% Marcaine and 1% Xylocaine.   COMPLICATIONS: None.   ESTIMATED BLOOD LOSS: Minimal.   DRAINS: None.   PROCEDURE: The patient was placed in the supine position on the operating table and with adequate sedation and monitoring the left breast was prepped and draped out as a sterile field. The patient had a subareolar mass at about the 1 o'clock position. This was a small hypoechoic mass seen with ultrasound. It had previously been core biopsied showing benign tissue. However, on follow-up the patient was noted to have focal pain at this site and some increased prominence of this nodule. At this point ultrasound probe with a sterile cover was brought up to the field. The mass in question was identified. Local anesthetic was instilled and a small stab was made in the skin. Through this the Bard ultra wire device was utilized and placed going through the nodule and anchored in place. Following this, local anesthetic was instilled along the superior portion of the areolar margin. Skin incision was made in this area extending from about the 11 to 2 o'clock position and carefully deepened through to the glandular tissue. The skin and subcutaneous tissue was elevated on both sides. The wire was freed from the skin and using the wire as a guide a core of tissue surrounding this was excised out completely. A small nodular mass was noted within this. Specimen was also ultrasounded showing the presence of a nodule with a wire going through it. This was sent in formalin for pathology. After ensuring hemostasis with cautery, the deeper tissues were closed with 2-0 Vicryl  and skin approximated with subcuticular 4-0 Vicryl reinforced with Dermabond.   The procedure was well tolerated. She was subsequently returned to the recovery room in stable condition.   ____________________________ S.Robinette Haines, MD sgs:drc D: 09/26/2011 10:48:53 ET T: 09/26/2011 12:29:20 ET JOB#: [id number]  cc: Synthia Innocent. Jamal Collin, MD, <Dictator> [facility] Robinette Haines MD ELECTRONICALLY SIGNED 10/03/2011 17:21

## 2017-08-21 DIAGNOSIS — Z8659 Personal history of other mental and behavioral disorders: Secondary | ICD-10-CM | POA: Insufficient documentation

## 2018-08-07 ENCOUNTER — Encounter: Payer: Self-pay | Admitting: *Deleted

## 2018-08-07 ENCOUNTER — Emergency Department
Admission: EM | Admit: 2018-08-07 | Discharge: 2018-08-07 | Disposition: A | Discharge status: Home / Self Care | Payer: BLUE CROSS/BLUE SHIELD | Attending: Emergency Medicine | Admitting: Emergency Medicine

## 2018-08-07 ENCOUNTER — Other Ambulatory Visit: Payer: Self-pay

## 2018-08-07 DIAGNOSIS — N939 Abnormal uterine and vaginal bleeding, unspecified: Secondary | ICD-10-CM | POA: Insufficient documentation

## 2018-08-07 LAB — URINALYSIS, COMPLETE (UACMP) WITH MICROSCOPIC
Bacteria, UA: NONE SEEN
RBC / HPF: >50 RBC/hpf — ABNORMAL HIGH (ref 0–5)
Specific Gravity, Urine: 1.011 (ref 1.005–1.030)

## 2018-08-07 LAB — CBC WITH DIFFERENTIAL/PLATELET
Abs Immature Granulocytes: 0.06 10*3/uL (ref 0.00–0.07)
Basophils Absolute: 0 10*3/uL (ref 0.0–0.1)
Basophils Relative: 0 %
Eosinophils Absolute: 0.3 10*3/uL (ref 0.0–0.5)
Eosinophils Relative: 2 %
HCT: 32.3 % — ABNORMAL LOW (ref 36.0–46.0)
Hemoglobin: 11 g/dL — ABNORMAL LOW (ref 12.0–15.0)
Immature Granulocytes: 0 %
Lymphocytes Relative: 20 %
Lymphs Abs: 2.9 10*3/uL (ref 0.7–4.0)
MCH: 30.1 pg (ref 26.0–34.0)
MCHC: 34.1 g/dL (ref 30.0–36.0)
MCV: 88.3 fL (ref 80.0–100.0)
Monocytes Absolute: 0.8 10*3/uL (ref 0.1–1.0)
Monocytes Relative: 6 %
Neutro Abs: 10.1 10*3/uL — ABNORMAL HIGH (ref 1.7–7.7)
Neutrophils Relative %: 72 %
Platelets: 407 10*3/uL — ABNORMAL HIGH (ref 150–400)
RBC: 3.66 MIL/uL — ABNORMAL LOW (ref 3.87–5.11)
RDW: 14 % (ref 11.5–15.5)
WBC: 14.1 10*3/uL — ABNORMAL HIGH (ref 4.0–10.5)
nRBC: 0.1 % (ref 0.0–0.2)

## 2018-08-07 LAB — COMPREHENSIVE METABOLIC PANEL
ALT: 14 U/L (ref 0–44)
AST: 20 U/L (ref 15–41)
Albumin: 4.1 g/dL (ref 3.5–5.0)
Alkaline Phosphatase: 24 U/L — ABNORMAL LOW (ref 38–126)
Anion gap: 12 (ref 5–15)
BUN: 10 mg/dL (ref 6–20)
CO2: 21 mmol/L — ABNORMAL LOW (ref 22–32)
Calcium: 9 mg/dL (ref 8.9–10.3)
Chloride: 106 mmol/L (ref 98–111)
Creatinine, Ser: 0.85 mg/dL (ref 0.44–1.00)
GFR calc Af Amer: >60 mL/min (ref >60)
GFR calc non Af Amer: >60 mL/min (ref >60)
Glucose, Bld: 98 mg/dL (ref 70–99)
Potassium: 3.6 mmol/L (ref 3.5–5.1)
Sodium: 139 mmol/L (ref 135–145)
Total Bilirubin: <0.1 mg/dL — ABNORMAL LOW (ref 0.3–1.2)
Total Protein: 7.1 g/dL (ref 6.5–8.1)

## 2018-08-07 LAB — POCT PREGNANCY, URINE: Preg Test, Ur: NEGATIVE

## 2018-08-07 MED ORDER — ACETAMINOPHEN 325 MG PO TABS
650.0000 mg | ORAL_TABLET | Freq: Once | ORAL | Status: AC
  Administered 2018-08-07: 650 mg via ORAL
  Filled 2018-08-07: qty 2

## 2018-08-07 NOTE — Discharge Instructions (Addendum)
Return to the emergency room if your bleeding more than a pad an hour, if you have lightheadedness, vomiting, severe pain you change your mind about a pelvic exam or you feel worse in any way.  Please follow close with OB/GYN to get to the bottom of your 7 years of heavy periods.  If you have other new or concerning symptoms please return to [place].

## 2018-08-07 NOTE — ED Triage Notes (Signed)
Pt ambulatory to triage.  Pt sent from [facility].  Pt has vaginal bleeding for 3 days.  No abd pain   Hx anemia.  Pt alert

## 2018-08-07 NOTE — ED Provider Notes (Signed)
[facility] Emergency Department Provider Note  ____________________________________________   I have reviewed the triage vital signs and the nursing notes. Where available I have reviewed prior notes and, if possible and indicated, outside hospital notes.    HISTORY  Chief Complaint I think I am okay to go   HPI Anita Aguirre is a 43 y.o. female who presents today complaining of heavy periods.  Patient was seen at an urgent care but apparently they sent her here because of reported heavy bleeding.  She states she has heavy bleeding every month.  It was pretty heavy yesterday but today is more like a regular period.  She states she had about a pad an hour today but is tapering off.  Patient has been having this since her last child was born in 2013.  She has no pain or lightheadedness.  She does take iron supplementation.  She states she needed a work note with the minor care did provide her with one.  She would not like to have a pelvic exam.  She has never followed up with OB for this.  She wants to make sure she is not too anemic and she would like to have her blood work checked.      Past Medical History:  Diagnosis Date  . Arthritis 2010  . Diffuse cystic mastopathy 2012   LEFT     There are no active problems to display for this patient.     Prior to Admission medications   Medication Sig Start Date End Date Taking? Authorizing Provider  dextroamphetamine (DEXTROSTAT) 10 MG tablet Take 10 mg by mouth daily.    [provider]  ibuprofen (ADVIL,MOTRIN) 200 MG tablet Take 200 mg by mouth every 6 (six) hours as needed for pain.    [provider]  Multiple Vitamin (MULTIVITAMIN WITH MINERALS) TABS Take 1 tablet by mouth daily.    [provider]    Allergies Zyban [bupropion]  No family history on file.  Social History Social History   Tobacco Use  . Smoking status: Current Every Day Smoker    Packs/day: 1.00     Years: 10.00    Pack years: 10.00  . Smokeless tobacco: Never Used  Substance Use Topics  . Alcohol use: No  . Drug use: No    Review of Systems Constitutional: No fever/chills Eyes: No visual changes. ENT: No sore throat. No stiff neck no neck pain Cardiovascular: Denies chest pain. Respiratory: Denies shortness of breath. Gastrointestinal:   no vomiting.  No diarrhea.  No constipation. Genitourinary: Negative for dysuria. Musculoskeletal: Negative lower extremity swelling Skin: Negative for rash. Neurological: Negative for severe headaches, focal weakness or numbness.   ____________________________________________   PHYSICAL EXAM:  VITAL SIGNS: ED Triage Vitals  Enc Vitals Group     BP 08/07/18 2003 (!) 137/96     Pulse Rate 08/07/18 2003 (!) 102     Resp 08/07/18 2003 20     Temp 08/07/18 2003 97.9 F (36.6 C)     Temp Source 08/07/18 2003 Oral     SpO2 08/07/18 2003 99 %     Weight 08/07/18 2004 206 lb (93.4 kg)     Height 08/07/18 2004 5\' 7"  (1.702 m)     Head Circumference --      Peak Flow --      Pain Score 08/07/18 2004 6     Pain Loc --      Pain Edu? --  Excl. in [place]? --     Constitutional: Alert and oriented. Well appearing and in no acute distress. Eyes: Conjunctivae are normal Head: Atraumatic HEENT: No congestion/rhinnorhea. Mucous membranes are moist.  Oropharynx non-erythematous Neck:   Nontender with no meningismus, no masses, no stridor Cardiovascular: Normal rate, regular rhythm. Grossly normal heart sounds.  Good peripheral circulation. Respiratory: Normal respiratory effort.  No retractions. Lungs CTAB. Abdominal: Soft and nontender. No distention. No guarding no rebound Back:  There is no focal tenderness or step off.  there is no midline tenderness there are no lesions noted. there is no CVA tenderness GU: Patient declines Musculoskeletal: No lower extremity tenderness, no upper extremity tenderness. No joint effusions, no DVT signs  strong distal pulses no edema Neurologic:  Normal speech and language. No gross focal neurologic deficits are appreciated.  Skin:  Skin is warm, dry and intact. No rash noted. Psychiatric: Mood and affect are normal. Speech and behavior are normal.  ____________________________________________   LABS (all labs ordered are listed, but only abnormal results are displayed)  Labs Reviewed  CBC WITH DIFFERENTIAL/PLATELET - Abnormal; Notable for the following components:      Result Value   WBC 14.1 (*)    RBC 3.66 (*)    Hemoglobin 11.0 (*)    HCT 32.3 (*)    Platelets 407 (*)    Neutro Abs 10.1 (*)    All other components within normal limits  COMPREHENSIVE METABOLIC PANEL - Abnormal; Notable for the following components:   CO2 21 (*)    Alkaline Phosphatase 24 (*)    Total Bilirubin <0.1 (*)    All other components within normal limits  URINALYSIS, COMPLETE (UACMP) WITH MICROSCOPIC - Abnormal; Notable for the following components:   Color, Urine RED (*)    APPearance BLOODY (*)    Glucose, UA   (*)    Value: TEST NOT REPORTED DUE TO COLOR INTERFERENCE OF URINE PIGMENT   Hgb urine dipstick   (*)    Value: TEST NOT REPORTED DUE TO COLOR INTERFERENCE OF URINE PIGMENT   Bilirubin Urine   (*)    Value: TEST NOT REPORTED DUE TO COLOR INTERFERENCE OF URINE PIGMENT   Ketones, ur   (*)    Value: TEST NOT REPORTED DUE TO COLOR INTERFERENCE OF URINE PIGMENT   Protein, ur   (*)    Value: TEST NOT REPORTED DUE TO COLOR INTERFERENCE OF URINE PIGMENT   Nitrite   (*)    Value: TEST NOT REPORTED DUE TO COLOR INTERFERENCE OF URINE PIGMENT   Leukocytes,Ua   (*)    Value: TEST NOT REPORTED DUE TO COLOR INTERFERENCE OF URINE PIGMENT   RBC / HPF >50 (*)    All other components within normal limits  POCT PREGNANCY, URINE    Pertinent labs  results that were available during my care of the patient were reviewed by me and considered in my medical decision making (see chart for  details). ____________________________________________  EKG  I personally interpreted any EKGs ordered by me or triage  ____________________________________________  RADIOLOGY  Pertinent labs & imaging results that were available during my care of the patient were reviewed by me and considered in my medical decision making (see chart for details). If possible, patient and/or family made aware of any abnormal findings.  No results found. ____________________________________________    PROCEDURES  Procedure(s) performed: None  Procedures  Critical Care performed: None  ____________________________________________   INITIAL IMPRESSION / ASSESSMENT AND PLAN / ED  COURSE  Pertinent labs & imaging results that were available during my care of the patient were reviewed by me and considered in my medical decision making (see chart for details).  Patient here with reported heavy vaginal bleeding for 7 years off and on, she is well-appearing hemoglobin is 11, she is not lightheaded she has no systemic symptoms.  She did want a work note but she already got that.  She would like some Tylenol.  Belly is benign.  Offered a pelvic exam and she refuses.  I do not think this is unreasonable not certain what were going to do about chronic menorrhagia.  I will however refer her to OB.  She is taking iron pills which are the right thing to do.  Urged her to take them as directed, she states she is.  Return precautions and follow-up given understood.    ____________________________________________   FINAL CLINICAL IMPRESSION(S) / ED DIAGNOSES  Final diagnoses:  None      This chart was dictated using voice recognition software.  Despite best efforts to proofread,  errors can occur which can change meaning.      Schuyler Amor, MD 08/07/18 2250

## 2018-08-09 ENCOUNTER — Ambulatory Visit (INDEPENDENT_AMBULATORY_CARE_PROVIDER_SITE_OTHER): Payer: BLUE CROSS/BLUE SHIELD | Admitting: Obstetrics and Gynecology

## 2018-08-09 ENCOUNTER — Other Ambulatory Visit: Payer: Self-pay

## 2018-08-09 ENCOUNTER — Other Ambulatory Visit: Payer: Self-pay | Admitting: Obstetrics and Gynecology

## 2018-08-09 ENCOUNTER — Encounter: Payer: Self-pay | Admitting: Obstetrics and Gynecology

## 2018-08-09 VITALS — BP 120/84 | Ht 67.0 in | Wt 218.0 lb

## 2018-08-09 DIAGNOSIS — Z113 Encounter for screening for infections with a predominantly sexual mode of transmission: Secondary | ICD-10-CM

## 2018-08-09 DIAGNOSIS — K219 Gastro-esophageal reflux disease without esophagitis: Secondary | ICD-10-CM | POA: Insufficient documentation

## 2018-08-09 DIAGNOSIS — Z124 Encounter for screening for malignant neoplasm of cervix: Secondary | ICD-10-CM | POA: Diagnosis not present

## 2018-08-09 DIAGNOSIS — N920 Excessive and frequent menstruation with regular cycle: Secondary | ICD-10-CM | POA: Diagnosis not present

## 2018-08-09 DIAGNOSIS — G43909 Migraine, unspecified, not intractable, without status migrainosus: Secondary | ICD-10-CM | POA: Insufficient documentation

## 2018-08-09 MED ORDER — IBUPROFEN 800 MG PO TABS: 800.0000 mg | ORAL_TABLET | Freq: Three times a day (TID) | ORAL | 1 refills | Status: DC | PRN

## 2018-08-09 NOTE — Progress Notes (Signed)
Obstetrics & Gynecology Office Visit   Chief Complaint  Patient presents with  . Follow-up    ER abn bleeding, now its light, no pain now just massive headaches and fatigue   History of Present Illness: 43 y.o. G3P0030 female who presents in follow-up from recent ER visit for heavy vaginal bleeding.  She notes that she has had heavy menses for many years but they have been getting worse more recently.  She states that the duration of her menses is approximately 7 days and this is not changed.  What has changed is that the amount of bleeding she has is heavier.  She also notes more fatigue recently.  She denies having the passage of clots she states that it just "pours out."  She was seen in the ER 2 days ago and her hemoglobin was 11 at that time.  She does take iron supplementation for her anemia.  She has tried nothing hormonal to control her bleeding.  Her last Pap smear was in 2014.  She does not recall having had an ultrasound for this.  She denies a history of STDs.  She does note a worsening headache and the only thing that makes this better is ibuprofen.  She denies weight loss, constipation, bloating, early satiety.  She denies the skin and hair changes, galactorrhea, visual changes.   Past Medical History:  Diagnosis Date  . Arthritis 2010  . Diffuse cystic mastopathy 2012   LEFT     Past Surgical History:  Procedure Laterality Date  . BREAST MASS EXCISION Left 2012  . COLONOSCOPY  2005   [place] ? MD   . INCISE AND DRAIN ABCESS  2010  . MASTECTOMY, PARTIAL  2013    Gynecologic History: Patient's last menstrual period was 08/05/2018 (exact date).  Obstetric History: G3P0030  Family History  Problem Relation Age of Onset  . Hypertension Mother   . Throat cancer Paternal Aunt 106  . Diabetes Paternal Grandmother     Social History   Socioeconomic History  . Marital status: Single    Spouse name: Not on file  . Number of children: Not on file  . Years of education:  Not on file  . Highest education level: Not on file  Occupational History  . Not on file  Social Needs  . Financial resource strain: Not on file  . Food insecurity:    Worry: Not on file    Inability: Not on file  . Transportation needs:    Medical: Not on file    Non-medical: Not on file  Tobacco Use  . Smoking status: Former Smoker    Packs/day: 1.00    Years: 10.00    Pack years: 10.00  . Smokeless tobacco: Never Used  Substance and Sexual Activity  . Alcohol use: No  . Drug use: No  . Sexual activity: Not Currently    Birth control/protection: None  Lifestyle  . Physical activity:    Days per week: Not on file    Minutes per session: Not on file  . Stress: Not on file  Relationships  . Social connections:    Talks on phone: Not on file    Gets together: Not on file    Attends religious service: Not on file    Active member of club or organization: Not on file    Attends meetings of clubs or organizations: Not on file    Relationship status: Not on file  . Intimate partner violence:    Fear  of current or ex partner: Not on file    Emotionally abused: Not on file    Physically abused: Not on file    Forced sexual activity: Not on file  Other Topics Concern  . Not on file  Social History Narrative  . Not on file    Allergies  Allergen Reactions  . Zyban [Bupropion] Itching    Prior to Admission medications   Medication Sig Start Date End Date Taking? Authorizing Provider  ibuprofen (ADVIL,MOTRIN) 200 MG tablet Take 200 mg by mouth every 6 (six) hours as needed for pain.   Yes [provider]  methylphenidate (RITALIN) 20 MG tablet  08/06/18  Yes [provider]  Multiple Vitamin (MULTIVITAMIN WITH MINERALS) TABS Take 1 tablet by mouth daily.   Yes [provider]    Review of Systems  Constitutional: Positive for malaise/fatigue. Negative for chills, diaphoresis, fever and weight loss.  HENT: Negative.   Eyes: Negative.    Respiratory: Negative.   Cardiovascular: Negative.   Gastrointestinal: Negative.   Genitourinary: Negative.        See HPI  Musculoskeletal: Negative.   Skin: Negative.   Neurological: Positive for headaches. Negative for dizziness, tingling, tremors, sensory change, speech change, focal weakness, seizures, loss of consciousness and weakness.  Psychiatric/Behavioral: Negative.      Physical Exam BP 120/84   Ht 5\' 7"  (1.702 m)   Wt 218 lb (98.9 kg)   LMP 08/05/2018 (Exact Date)   BMI 34.14 kg/m  Patient's last menstrual period was 08/05/2018 (exact date). Physical Exam Constitutional:      General: She is not in acute distress.    Appearance: Normal appearance. She is well-developed.  Genitourinary:     Pelvic exam was performed with patient in the lithotomy position.     Vulva, inguinal canal, urethra, bladder, vagina, uterus, right adnexa and left adnexa normal.     No posterior fourchette tenderness, injury or lesion present.     Cervical bleeding (mild) present.     No cervical friability, lesion or polyp.     Uterus is anteverted.  HENT:     Head: Normocephalic and atraumatic.  Eyes:     General: No scleral icterus.    Conjunctiva/sclera: Conjunctivae normal.  Neck:     Musculoskeletal: Normal range of motion and neck supple.  Cardiovascular:     Rate and Rhythm: Normal rate and regular rhythm.     Heart sounds: Murmur (early SEM) present. No friction rub. No gallop.   Pulmonary:     Effort: Pulmonary effort is normal. No respiratory distress.     Breath sounds: Normal breath sounds. No wheezing or rales.  Abdominal:     General: Bowel sounds are normal. There is no distension.     Palpations: Abdomen is soft. There is no mass.     Tenderness: There is no abdominal tenderness. There is no guarding or rebound.  Musculoskeletal: Normal range of motion.  Neurological:     General: No focal deficit present.     Mental Status: She is alert and oriented to person,  place, and time.     Cranial Nerves: No cranial nerve deficit.  Skin:    General: Skin is warm and dry.     Findings: No erythema.  Psychiatric:        Mood and Affect: Mood normal.        Behavior: Behavior normal.        Judgment: Judgment normal.    Female  chaperone present for pelvic and breast  portions of the physical exam  Assessment: 43 y.o. G74P0030 female here for  1. Menorrhagia with regular cycle   2. Pap smear for cervical cancer screening   3. Screen for STD (sexually transmitted disease)      Plan: Problem List Items Addressed This Visit      Other   Menorrhagia with regular cycle - Primary   Relevant Orders   US PELVIS TRANSVANGINAL NON-OB (TV ONLY)   IGP,CtNg,AptimaHPV,rfx16/18,45   Von Willebrand panel    Other Visit Diagnoses    Pap smear for cervical cancer screening       Relevant Orders   IGP,CtNg,AptimaHPV,rfx16/18,45   Screen for STD (sexually transmitted disease)       Relevant Orders   IGP,CtNg,AptimaHPV,rfx16/18,45     Discussed management options for abnormal uterine bleeding including expectant, NSAIDs, tranexamic acid (Lysteda), oral progesterone (Provera, norethindrone, megace), Depo Provera, Levonorgestrel containing IUD, endometrial ablation (Novasure) or hysterectomy as definitive surgical management.  Discussed risks and benefits of each method.   Final management decision will hinge on results of patient's work up and whether an underlying etiology for the patients bleeding symptoms can be discerned.  We will conduct a basic work up examining using the PALM-COIEN classification system.  In the meantime the patient opts to trial nothing (but if she keeps bleeding, will try progesterone) while we await results of her ultrasound and labs.  Printed patient education handouts were given to the patient to review at home.  Bleeding precautions reviewed.  I offered to perform an endometrial biopsy today.  However, she declined to assess until after she  has her ultrasound.  Pap smear along with STD screening performed today.  Pelvic ultrasound has been ordered to assess for structural issues.  A von Willebrand panel was ordered to assess for bleeding issues.  30 minutes spent in face to face discussion with > 50% spent in counseling,management, and coordination of care of her menorrhagia with regular cycle.   Prentice Docker, MD 08/09/2018 4:08 PM

## 2018-08-10 LAB — VON WILLEBRAND PANEL
Factor VIII Activity: 189 % — ABNORMAL HIGH (ref 56–140)
Von Willebrand Ag: 115 % (ref 50–200)
Von Willebrand Factor: 144 % (ref 50–200)

## 2018-08-10 LAB — COAG STUDIES INTERP REPORT

## 2018-08-15 LAB — IGP,CTNG,APTIMAHPV,RFX16/18,45
Chlamydia, Nuc. Acid Amp: NEGATIVE
Gonococcus by Nucleic Acid Amp: NEGATIVE
HPV Aptima: NEGATIVE

## 2018-08-16 ENCOUNTER — Other Ambulatory Visit: Payer: Self-pay | Admitting: Obstetrics and Gynecology

## 2018-08-16 DIAGNOSIS — A5901 Trichomonal vulvovaginitis: Secondary | ICD-10-CM

## 2018-08-16 MED ORDER — METRONIDAZOLE 500 MG PO TABS: 2000.0000 mg | ORAL_TABLET | Freq: Once | ORAL | 0 refills | Status: AC

## 2018-08-28 ENCOUNTER — Encounter: Payer: Self-pay | Admitting: Obstetrics and Gynecology

## 2018-08-28 ENCOUNTER — Ambulatory Visit (INDEPENDENT_AMBULATORY_CARE_PROVIDER_SITE_OTHER): Payer: BLUE CROSS/BLUE SHIELD

## 2018-08-28 ENCOUNTER — Other Ambulatory Visit: Payer: Self-pay

## 2018-08-28 ENCOUNTER — Ambulatory Visit (INDEPENDENT_AMBULATORY_CARE_PROVIDER_SITE_OTHER): Payer: BLUE CROSS/BLUE SHIELD | Admitting: Obstetrics and Gynecology

## 2018-08-28 VITALS — BP 122/74 | Ht 67.0 in | Wt 220.0 lb

## 2018-08-28 DIAGNOSIS — N920 Excessive and frequent menstruation with regular cycle: Secondary | ICD-10-CM

## 2018-08-28 DIAGNOSIS — A5901 Trichomonal vulvovaginitis: Secondary | ICD-10-CM | POA: Diagnosis not present

## 2018-08-28 DIAGNOSIS — D251 Intramural leiomyoma of uterus: Secondary | ICD-10-CM

## 2018-08-28 DIAGNOSIS — D259 Leiomyoma of uterus, unspecified: Secondary | ICD-10-CM | POA: Insufficient documentation

## 2018-08-28 MED ORDER — METRONIDAZOLE 500 MG PO TABS: 2000.0000 mg | ORAL_TABLET | Freq: Once | ORAL | 0 refills | Status: AC

## 2018-08-28 MED ORDER — MEDROXYPROGESTERONE ACETATE 10 MG PO TABS: 10.0000 mg | ORAL_TABLET | Freq: Every day | ORAL | 0 refills | Status: DC

## 2018-08-28 NOTE — Progress Notes (Signed)
Gynecology Ultrasound Follow Up  Chief Complaint:  Chief Complaint  Patient presents with  . Follow-up  menorrhagia with regular cycle   History of Present Illness: Patient is a 43 y.o. female who presents today for ultrasound evaluation of the above .  Ultrasound demonstrates the following findings Adnexa: right ovarian cyst with internal echoes measuring 3.5 x 3.6 x 2.7 cm, no internal blood flow on doppler interrogation  Uterus: anteverted with endometrial stripe  11.0 mm Additional: two fibroids, 1) posterior intramural (4.4 x 4.3 x 5.4 cm), 2) fundal intramural (6.1 x 6.6 x 6.2 cm).  She has had no recent bleeding.   Past Medical History:  Diagnosis Date  . Arthritis 2010  . Diffuse cystic mastopathy 2012   LEFT     Past Surgical History:  Procedure Laterality Date  . BREAST MASS EXCISION Left 2012  . COLONOSCOPY  2005   [place] ? MD   . INCISE AND DRAIN ABCESS  2010  . MASTECTOMY, PARTIAL  2013    Family History  Problem Relation Age of Onset  . Hypertension Mother   . Throat cancer Paternal Aunt 89  . Diabetes Paternal Grandmother     Social History   Socioeconomic History  . Marital status: Single    Spouse name: Not on file  . Number of children: Not on file  . Years of education: Not on file  . Highest education level: Not on file  Occupational History  . Not on file  Social Needs  . Financial resource strain: Not on file  . Food insecurity:    Worry: Not on file    Inability: Not on file  . Transportation needs:    Medical: Not on file    Non-medical: Not on file  Tobacco Use  . Smoking status: Former Smoker    Packs/day: 1.00    Years: 10.00    Pack years: 10.00  . Smokeless tobacco: Never Used  Substance and Sexual Activity  . Alcohol use: No  . Drug use: No  . Sexual activity: Not Currently    Birth control/protection: None  Lifestyle  . Physical activity:    Days per week: Not on file    Minutes per session: Not on file  .  Stress: Not on file  Relationships  . Social connections:    Talks on phone: Not on file    Gets together: Not on file    Attends religious service: Not on file    Active member of club or organization: Not on file    Attends meetings of clubs or organizations: Not on file    Relationship status: Not on file  . Intimate partner violence:    Fear of current or ex partner: Not on file    Emotionally abused: Not on file    Physically abused: Not on file    Forced sexual activity: Not on file  Other Topics Concern  . Not on file  Social History Narrative  . Not on file    Allergies  Allergen Reactions  . Zyban [Bupropion] Itching    Prior to Admission medications   Medication Sig Start Date End Date Taking? Authorizing Provider  ibuprofen (ADVIL) 800 MG tablet Take 1 tablet (800 mg total) by mouth every 8 (eight) hours as needed. 08/09/18   Will Bonnet, MD  methylphenidate (RITALIN) 20 MG tablet  08/06/18   [provider]  Multiple Vitamin (MULTIVITAMIN WITH MINERALS) TABS Take 1 tablet by mouth daily.  [provider]    Physical Exam BP 122/74   Ht 5\' 7"  (1.702 m)   Wt 220 lb (99.8 kg)   LMP 08/07/2018 (Exact Date)   BMI 34.46 kg/m    Physical Exam Constitutional:      General: She is not in acute distress.    Appearance: Normal appearance.  Genitourinary:     Pelvic exam was performed with patient in the lithotomy position.     Vulva, inguinal canal, urethra, bladder and vagina normal.     No cervical discharge, lesion, bleeding or polyp.  HENT:     Head: Normocephalic and atraumatic.  Eyes:     General: No scleral icterus.    Conjunctiva/sclera: Conjunctivae normal.  Neurological:     General: No focal deficit present.     Mental Status: She is alert and oriented to person, place, and time.     Cranial Nerves: No cranial nerve deficit.  Psychiatric:        Mood and Affect: Mood normal.        Behavior: Behavior normal.         Judgment: Judgment normal.     Endometrial Biopsy After discussion with the patient regarding her abnormal uterine bleeding I recommended that she proceed with an endometrial biopsy for further diagnosis. The risks, benefits, alternatives, and indications for an endometrial biopsy were discussed with the patient in detail. She understood the risks including infection, bleeding, cervical laceration and uterine perforation.  Verbal consent was obtained.   PROCEDURE NOTE:  [place] endometrial biopsy was performed using aseptic technique with iodine preparation.  The uterus was sounded to a length of 9 cm.  Adequate sampling was obtained with minimal blood loss.  The patient tolerated the procedure well.  Disposition will be pending pathology.  Female chaperone present for pelvic exam:    Assessment: 43 y.o. G3P0030  1. Menorrhagia with regular cycle   2. Intramural leiomyoma of uterus   3. Trichomonas vaginalis (TV) infection      Plan: Problem List Items Addressed This Visit      Genitourinary   Fibroid uterus     Other   Menorrhagia with regular cycle - Primary   Relevant Medications   medroxyPROGESTERone (PROVERA) 10 MG tablet   metroNIDAZOLE (FLAGYL) 500 MG tablet   Other Relevant Orders   Pathology Report ([facility])    Other Visit Diagnoses    Trichomonas vaginalis (TV) infection       Relevant Medications   metroNIDAZOLE (FLAGYL) 500 MG tablet     Discussed ultrasound findings with patient.  She has 2 medium sized fibroids, which could explain her heavy bleeding.  However, endometrial pathology has not been ruled out.  An endometrial biopsy was performed today.  She had a normal Pap smear and normal testing for von Willebrand's disease.  We did discuss the finding of trichomonas on her Pap smear.  She states that she has had this for a while.  However, she states that she has not been sexually active for a while.  We will treat this again.  Will get a test of cure prior to her  next treatment.  Discussed treatment options in case her biopsy is normal.  Discussed no treatment at this time.  Discussed medication treatment.  She seems most interested in a Mirena IUD.  We also discussed surgical options, including either endometrial ablation or hysterectomy.  She would like to proceed with an IUD once she has been cleared from a biopsy perspective.  She was provided a small amount of Provera in case she has a very heavy bleed with her next menses, if that occurs prior to the placement of her IUD.  20 minutes spent in face to face discussion with > 50% spent in counseling,management, and coordination of care of her menorrhagia with regular cycles, fibroid uterus, and trichomonas vaginalis.   Prentice Docker, MD, [facility], [place] 08/28/2018 6:03 PM

## 2018-09-02 LAB — ANATOMIC PATHOLOGY REPORT: PDF Image: 0

## 2018-09-06 ENCOUNTER — Other Ambulatory Visit: Payer: Self-pay | Admitting: Obstetrics and Gynecology

## 2018-09-06 DIAGNOSIS — D251 Intramural leiomyoma of uterus: Secondary | ICD-10-CM

## 2018-09-06 DIAGNOSIS — N920 Excessive and frequent menstruation with regular cycle: Secondary | ICD-10-CM

## 2018-09-06 MED ORDER — MEDROXYPROGESTERONE ACETATE 10 MG PO TABS: 20.0000 mg | ORAL_TABLET | Freq: Three times a day (TID) | ORAL | 0 refills | Status: DC

## 2018-09-20 ENCOUNTER — Telehealth: Payer: Self-pay | Admitting: Obstetrics and Gynecology

## 2018-09-20 NOTE — Telephone Encounter (Signed)
Patient is schedule in [place] with [facility] on 09/26/18 at 2 pm for mirena insertion

## 2018-09-26 ENCOUNTER — Encounter: Payer: Self-pay | Admitting: Obstetrics and Gynecology

## 2018-09-26 ENCOUNTER — Ambulatory Visit (INDEPENDENT_AMBULATORY_CARE_PROVIDER_SITE_OTHER): Payer: BLUE CROSS/BLUE SHIELD | Admitting: Obstetrics and Gynecology

## 2018-09-26 ENCOUNTER — Other Ambulatory Visit: Payer: Self-pay

## 2018-09-26 VITALS — BP 140/89 | HR 78 | Ht 67.0 in | Wt 208.0 lb

## 2018-09-26 DIAGNOSIS — Z30430 Encounter for insertion of intrauterine contraceptive device: Secondary | ICD-10-CM | POA: Diagnosis not present

## 2018-09-26 MED ORDER — LEVONORGESTREL 20 MCG/24HR IU IUD: 1.0000 | INTRAUTERINE_SYSTEM | Freq: Once | INTRAUTERINE | 0 refills | Status: DC

## 2018-09-26 NOTE — Progress Notes (Signed)
  IUD Insertion Procedure Note (Mirena)  Patient identified, informed consent performed, consent signed.   Discussed risks of irregular bleeding, cramping, infection, malpositioning, expulsion or uterine perforation of the IUD (1:1000 placements)  which may require further procedure such as laparoscopy.  IUD while effective at preventing pregnancy do not prevent transmission of sexually transmitted diseases and use of barrier methods for this purpose was discussed. Time out was performed.  Urine pregnancy test negative.  Speculum placed in the vagina.  Cervix visualized.  Cleaned with Betadine x 2.  Grasped anteriorly with a single tooth tenaculum.  Uterus sounded to 10 cm. IUD placed per manufacturer's recommendations.  Strings trimmed to 3 cm. Tenaculum was removed, good hemostasis noted.  Patient tolerated procedure well.   Patient was given post-procedure instructions.  She was advised to have backup contraception for one week.  Patient was also asked to check IUD strings periodically and follow up in 4 weeks for IUD check.  Prentice Docker, MD, [facility], [facility] 09/26/2018 3:06 PM

## 2018-09-26 NOTE — Telephone Encounter (Signed)
Mirena reserved for this patient.

## 2018-10-09 ENCOUNTER — Other Ambulatory Visit: Payer: Self-pay | Admitting: Obstetrics and Gynecology

## 2018-10-09 ENCOUNTER — Telehealth: Payer: Self-pay

## 2018-10-09 DIAGNOSIS — N920 Excessive and frequent menstruation with regular cycle: Secondary | ICD-10-CM

## 2018-10-09 MED ORDER — IBUPROFEN 800 MG PO TABS: 800.0000 mg | ORAL_TABLET | Freq: Three times a day (TID) | ORAL | 1 refills | Status: DC | PRN

## 2018-10-09 NOTE — Telephone Encounter (Signed)
Pt calling for refill ibuprofen; pharm requested it a week ago and hasn't heard back.  [phone]

## 2018-10-09 NOTE — Telephone Encounter (Signed)
Please advise 

## 2018-10-09 NOTE — Telephone Encounter (Signed)
I have not seen anything from pharmacy. But, I will refill.

## 2018-10-30 ENCOUNTER — Ambulatory Visit (INDEPENDENT_AMBULATORY_CARE_PROVIDER_SITE_OTHER): Payer: BLUE CROSS/BLUE SHIELD | Admitting: Obstetrics and Gynecology

## 2018-10-30 ENCOUNTER — Other Ambulatory Visit: Payer: Self-pay

## 2018-10-30 ENCOUNTER — Encounter: Payer: Self-pay | Admitting: Obstetrics and Gynecology

## 2018-10-30 VITALS — BP 147/98 | Wt 218.0 lb

## 2018-10-30 DIAGNOSIS — Z30431 Encounter for routine checking of intrauterine contraceptive device: Secondary | ICD-10-CM

## 2018-10-30 NOTE — Progress Notes (Signed)
   IUD String Check  Subjctive: [name] presents for IUD string check.  She had a Mirena placed 4 weeks ago.  Since placement of her IUD she had some vaginal bleeding.  She denies cramping or discomfort.  She has not had intercourse since placement.  She has not checked the strings.  She denies any fever, chills, nausea, vomiting, or other complaints.    Objective: BP (!) 147/98   Wt 218 lb (98.9 kg)   BMI 34.14 kg/m  Physical Exam Exam conducted with a chaperone present.  Constitutional:      General: She is not in acute distress.    Appearance: She is well-developed.  HENT:     Head: Normocephalic and atraumatic.     Nose: Nose normal.  Eyes:     Conjunctiva/sclera: Conjunctivae normal.  Neck:     Musculoskeletal: Normal range of motion.  Cardiovascular:     Rate and Rhythm: Normal rate and regular rhythm.     Heart sounds: Normal heart sounds.  Pulmonary:     Effort: Pulmonary effort is normal.     Breath sounds: Normal breath sounds.  Abdominal:     General: There is no distension.     Palpations: Abdomen is soft.     Tenderness: There is no abdominal tenderness. There is no guarding or rebound.  Genitourinary:    General: Normal vulva.     Exam position: Lithotomy position.     Tanner stage (genital): 5.     Labia:        Right: No rash, tenderness, lesion or injury.        Left: No rash, tenderness, lesion or injury.      Vagina: Normal. No vaginal discharge.     Cervix: Normal.     Uterus: Normal. Not tender.      Adnexa: Right adnexa normal and left adnexa normal.       Right: No mass.         Left: No mass.       Comments: IUD Strings visible and normal length Musculoskeletal: Normal range of motion.  Skin:    General: Skin is warm and dry.     Findings: No rash.  Neurological:     Mental Status: She is alert and oriented to person, place, and time.  Psychiatric:        Behavior: Behavior normal.     Female chaperone was present for the  entirety of the pelvic exam  Assessment: 43 y.o. year old female status post prior Mirena IUD placement 4 week ago, doing well.  Plan: 1.  The patient was given instructions to check her IUD strings monthly and call with any problems or concerns.  She should call for fevers, chills, abnormal vaginal discharge, pelvic pain, or other complaints. 2.  She will return for a annual exam in 1 year.  All questions answered.  15 minutes spent in face to face discussion with > 50% spent in counseling, management, and coordination of care for her newly-placed IUD.  Risks and benefits of IUD discussed including the risks of irregular bleeding, cramping, infection, malpositioning, expulsion, which may require further procedures such as laparoscopy.  IUDs while effective at preventing pregnancy do not prevent transmission of sexually transmitted diseases and use of barrier methods for this purpose was discussed.  Low overall incidence of failure with 99.7% efficacy rate in typical use.    Prentice Docker, MD 10/30/2018 2:27 PM

## 2018-11-21 NOTE — Telephone Encounter (Signed)
Rcvd/charge Mirena & insertion 09/26/2018

## 2021-10-07 ENCOUNTER — Encounter: Payer: Self-pay | Admitting: Intensive Care

## 2021-10-07 ENCOUNTER — Emergency Department: Payer: Self-pay

## 2021-10-07 ENCOUNTER — Emergency Department
Admission: EM | Admit: 2021-10-07 | Discharge: 2021-10-07 | Disposition: A | Discharge status: Home / Self Care | Payer: Self-pay | Attending: Emergency Medicine | Admitting: Emergency Medicine

## 2021-10-07 ENCOUNTER — Other Ambulatory Visit: Payer: Self-pay

## 2021-10-07 DIAGNOSIS — R109 Unspecified abdominal pain: Secondary | ICD-10-CM | POA: Insufficient documentation

## 2021-10-07 DIAGNOSIS — D649 Anemia, unspecified: Secondary | ICD-10-CM | POA: Insufficient documentation

## 2021-10-07 DIAGNOSIS — R079 Chest pain, unspecified: Secondary | ICD-10-CM | POA: Insufficient documentation

## 2021-10-07 DIAGNOSIS — I10 Essential (primary) hypertension: Secondary | ICD-10-CM | POA: Insufficient documentation

## 2021-10-07 DIAGNOSIS — N939 Abnormal uterine and vaginal bleeding, unspecified: Secondary | ICD-10-CM | POA: Insufficient documentation

## 2021-10-07 DIAGNOSIS — R0602 Shortness of breath: Secondary | ICD-10-CM

## 2021-10-07 DIAGNOSIS — E876 Hypokalemia: Secondary | ICD-10-CM | POA: Insufficient documentation

## 2021-10-07 DIAGNOSIS — D219 Benign neoplasm of connective and other soft tissue, unspecified: Secondary | ICD-10-CM

## 2021-10-07 DIAGNOSIS — D259 Leiomyoma of uterus, unspecified: Secondary | ICD-10-CM | POA: Insufficient documentation

## 2021-10-07 HISTORY — DX: Mastitis without abscess: N61.0

## 2021-10-07 HISTORY — DX: Essential (primary) hypertension: I10

## 2021-10-07 HISTORY — DX: Other specified behavioral and emotional disorders with onset usually occurring in childhood and adolescence: F98.8

## 2021-10-07 LAB — TROPONIN I (HIGH SENSITIVITY): Troponin I (High Sensitivity): <2 ng/L (ref <18)

## 2021-10-07 LAB — COMPREHENSIVE METABOLIC PANEL
ALT: 21 U/L (ref 0–44)
AST: 26 U/L (ref 15–41)
Albumin: 3.8 g/dL (ref 3.5–5.0)
Alkaline Phosphatase: 22 U/L — ABNORMAL LOW (ref 38–126)
Anion gap: 9 (ref 5–15)
BUN: 8 mg/dL (ref 6–20)
CO2: 27 mmol/L (ref 22–32)
Calcium: 9.5 mg/dL (ref 8.9–10.3)
Chloride: 100 mmol/L (ref 98–111)
Creatinine, Ser: 0.95 mg/dL (ref 0.44–1.00)
GFR, Estimated: >60 mL/min (ref >60)
Glucose, Bld: 104 mg/dL — ABNORMAL HIGH (ref 70–99)
Potassium: 3 mmol/L — ABNORMAL LOW (ref 3.5–5.1)
Sodium: 136 mmol/L (ref 135–145)
Total Bilirubin: 0.5 mg/dL (ref 0.3–1.2)
Total Protein: 7.1 g/dL (ref 6.5–8.1)

## 2021-10-07 LAB — IRON AND TIBC
Iron: 16 ug/dL — ABNORMAL LOW (ref 28–170)
Saturation Ratios: 3 % — ABNORMAL LOW (ref 10.4–31.8)
TIBC: 468 ug/dL — ABNORMAL HIGH (ref 250–450)
UIBC: 452 ug/dL

## 2021-10-07 LAB — CBC WITH DIFFERENTIAL/PLATELET
Abs Immature Granulocytes: 0.05 10*3/uL (ref 0.00–0.07)
Basophils Absolute: 0 10*3/uL (ref 0.0–0.1)
Basophils Relative: 0 %
Eosinophils Absolute: 0.4 10*3/uL (ref 0.0–0.5)
Eosinophils Relative: 4 %
HCT: 23.5 % — ABNORMAL LOW (ref 36.0–46.0)
Hemoglobin: 7.4 g/dL — ABNORMAL LOW (ref 12.0–15.0)
Immature Granulocytes: 1 %
Lymphocytes Relative: 24 %
Lymphs Abs: 2.6 10*3/uL (ref 0.7–4.0)
MCH: 27.5 pg (ref 26.0–34.0)
MCHC: 31.5 g/dL (ref 30.0–36.0)
MCV: 87.4 fL (ref 80.0–100.0)
Monocytes Absolute: 0.7 10*3/uL (ref 0.1–1.0)
Monocytes Relative: 7 %
Neutro Abs: 6.7 10*3/uL (ref 1.7–7.7)
Neutrophils Relative %: 64 %
Platelets: 528 10*3/uL — ABNORMAL HIGH (ref 150–400)
RBC: 2.69 MIL/uL — ABNORMAL LOW (ref 3.87–5.11)
RDW: 15.5 % (ref 11.5–15.5)
WBC: 10.5 10*3/uL (ref 4.0–10.5)
nRBC: 0 % (ref 0.0–0.2)

## 2021-10-07 LAB — MAGNESIUM: Magnesium: 1.8 mg/dL (ref 1.7–2.4)

## 2021-10-07 LAB — POC URINE PREG, ED: Preg Test, Ur: NEGATIVE

## 2021-10-07 LAB — ABO/RH: ABO/RH(D): O POS

## 2021-10-07 LAB — PREPARE RBC (CROSSMATCH)

## 2021-10-07 MED ORDER — POTASSIUM CHLORIDE CRYS ER 20 MEQ PO TBCR
40.0000 meq | EXTENDED_RELEASE_TABLET | Freq: Once | ORAL | Status: AC
  Administered 2021-10-07: 40 meq via ORAL
  Filled 2021-10-07: qty 2

## 2021-10-07 MED ORDER — FERRAPLUS 90 90-1 MG PO TABS: 1.0000 | ORAL_TABLET | Freq: Every day | ORAL | 0 refills | Status: AC

## 2021-10-07 MED ORDER — SODIUM CHLORIDE 0.9% IV SOLUTION
Freq: Once | INTRAVENOUS | Status: DC
  Filled 2021-10-07: qty 250

## 2021-10-07 NOTE — ED Provider Notes (Signed)
[facility] Provider Note    Event Date/Time   First MD Initiated Contact with Patient 10/07/21 1409     (approximate)   History   Abnormal Lab   HPI  Anita Aguirre is a 46 y.o. female with a past medical history of HTN, arthritis, ADD and previously regular periods typically that, every month lasting about 7 days presents for evaluation after being told to come to emergency room by her PCP for low anemia in the setting of heavy vaginal bleeding.  Patient states that prior to yesterday when she went Depo-Provera shot at her PCPs office she had been bleeding for 2 weeks changing pads every 20 minutes.  She states that over this time she has had some chest tightness, shortness of breath, dizziness and fatigue.  She denies any nausea, vomiting, abdominal pain, back pain, melanotic stools, diarrhea, constipation or any burning with urination or other abnormal discharge.  She has never had any bleeding problems before.  She is not on any blood thinners.  She does take ibuprofen.  She also states she had an IUD placed about 3 years ago but has not followed up with an OB/GYN and does not currently have when she is following with.    Past Medical History:  Diagnosis Date   ADD (attention deficit disorder)    Arthritis 04/24/2008   Diffuse cystic mastopathy 04/24/2010   LEFT    Hypertension    Mastitis      Physical Exam  Triage Vital Signs: ED Triage Vitals  Enc Vitals Group     BP 10/07/21 1234 (!) 145/89     Pulse Rate 10/07/21 1234 (!) 58     Resp 10/07/21 1234 16     Temp 10/07/21 1234 97.9 F (36.6 C)     Temp Source 10/07/21 1234 Oral     SpO2 10/07/21 1234 100 %     Weight 10/07/21 1245 225 lb (102.1 kg)     Height 10/07/21 1245 5' 7.5" (1.715 m)     Head Circumference --      Peak Flow --      Pain Score 10/07/21 1245 5     Pain Loc --      Pain Edu? --      Excl. in [place]? --     Most recent vital signs: Vitals:   10/07/21 1234 10/07/21 1715   BP: (!) 145/89 126/84  Pulse: (!) 58 88  Resp: 16 20  Temp: 97.9 F (36.6 C) 97.9 F (36.6 C)  SpO2: 100% 100%    General: Awake, no distress.  CV:  Good peripheral perfusion.  2+ radial pulses.  Slightly bradycardic. Resp:  Normal effort.  Clear bilaterally. Abd:  No distention.  Soft throughout. Other:     ED Results / Procedures / Treatments  Labs (all labs ordered are listed, but only abnormal results are displayed) Labs Reviewed  CBC WITH DIFFERENTIAL/PLATELET - Abnormal; Notable for the following components:      Result Value   RBC 2.69 (*)    Hemoglobin 7.4 (*)    HCT 23.5 (*)    Platelets 528 (*)    All other components within normal limits  COMPREHENSIVE METABOLIC PANEL - Abnormal; Notable for the following components:   Potassium 3.0 (*)    Glucose, Bld 104 (*)    Alkaline Phosphatase 22 (*)    All other components within normal limits  IRON AND TIBC - Abnormal; Notable for the following components:  Iron 16 (*)    TIBC 468 (*)    Saturation Ratios 3 (*)    All other components within normal limits  MAGNESIUM  POC URINE PREG, ED  TYPE AND SCREEN  PREPARE RBC (CROSSMATCH)  ABO/RH  TROPONIN I (HIGH SENSITIVITY)     EKG  EKG has evidence of normal sinus rhythm with a ventricular rate of 82, normal axis, unremarkable intervals with nonspecific ST change in lead III and aVF without any other clear evidence of acute ischemia or significant arrhythmia.   RADIOLOGY Chest reviewed by myself shows no focal consoidation, effusion, edema, pneumothorax or other clear acute thoracic process. I also reviewed radiology interpretation and agree with findings described.  Pelvic ultrasound on my review with what appears to be a fibroid.  As reviewed radiology's interpretation and agree to findings of fibroid in addition to possible adenomyosis.    PROCEDURES:  Critical Care performed: Yes, see critical care procedure note(s)  .Critical Care  Performed by:  Lucrezia Starch, MD Authorized by: Lucrezia Starch, MD   Critical care provider statement:    Critical care time (minutes):  30   Critical care was necessary to treat or prevent imminent or life-threatening deterioration of the following conditions:  Circulatory failure   Critical care was time spent personally by me on the following activities:  Development of treatment plan with patient or surrogate, discussions with consultants, evaluation of patient's response to treatment, examination of patient, ordering and review of laboratory studies, ordering and review of radiographic studies, ordering and performing treatments and interventions, pulse oximetry, re-evaluation of patient's condition and review of old charts     MEDICATIONS ORDERED IN ED: Medications  0.9 %  sodium chloride infusion (Manually program via Guardrails IV Fluids) (has no administration in time range)  potassium chloride SA (KLOR-CON M) CR tablet 40 mEq (40 mEq Oral Given 10/07/21 1557)     IMPRESSION / MDM / [place] / ED COURSE  I reviewed the triage vital signs and the nursing notes. Patient's presentation is most consistent with acute presentation with potential threat to life or bodily function.                               Differential diagnosis includes, but is not limited to a vaginal bleeding secondary to hormonal imbalance, endometriosis, polyps, and malignancy with lower suspicion for an acute coagulopathy trauma or acute infectious process.  I suspect her chest pain shortness of breath and fatigue are related to symptomatic anemia other obtain a chest x-ray EKG and troponin as well.  Chest reviewed by myself shows no focal consoidation, effusion, edema, pneumothorax or other clear acute thoracic process. I also reviewed radiology interpretation and agree with findings described.  EKG has evidence of normal sinus rhythm with a ventricular rate of 82, normal axis, unremarkable intervals with  nonspecific ST change in lead III and aVF without any other clear evidence of acute ischemia or significant arrhythmia.  Undetectable troponin obtained greater than 3 hours after symptom onset is not suggestive of ACS or myocarditis.  CBC shows no leukocytosis and hemoglobin today of 7.4 with most recent available 3 years ago 11.  Platelets are 528.  CMP shows a K of 3 without any other significant electrolyte or metabolic derangements.  Magnesium is WNL.  Pregnancy test is negative.  Iron panel is consistent with iron deficiency anemia which I suspect is secondary to her vaginal  bleeding and blood loss.  Pelvic ultrasound on my review with what appears to be a fibroid.  As reviewed radiology's interpretation and agree to findings of fibroid in addition to possible adenomyosis.  Suspect patient's chest tightness and shortness of breath lightheadedness and fatigue are secondary to her anemia.  She is agreeable to receiving blood transfusion emergency room although does not wish to be hospitalized.  She is otherwise hemodynamically stable and has had not had any ongoing bleeding today after receiving a Depo-Provera shot yesterday.  I think this is probably reasonable I discussed with patient my concern for her fairly severe anemia.  I will start her on iron supplementation.  I discussed with on-call gynecologist Dr. Leafy Ro who agrees with this plan and recommendation for close outpatient gynecology follow-up which I discussed with patient.  Discussed returning immediately to emergency room for any new or worsening of symptoms or recurrence of any significant bleeding.  Rx written for iron supplementation.  Discharged in stable condition.  Strict return precautions advised and discussed.      FINAL CLINICAL IMPRESSION(S) / ED DIAGNOSES   Final diagnoses:  Vaginal bleeding  Symptomatic anemia  Hypokalemia  Chest pain, unspecified type  SOB (shortness of breath)  Fibroid     Rx / DC Orders   ED  Discharge Orders          Ordered    Iron-Folic [id number] (FERRAPLUS 90) 90-1 MG TABS  Daily        10/07/21 1704             Note:  This document was prepared using Dragon voice recognition software and may include unintentional dictation errors.   Lucrezia Starch, MD 10/07/21 1807

## 2021-10-07 NOTE — ED Triage Notes (Signed)
Patient sent by PCP for low hemoglobin. Reports she has been passing large blood clots X2 weeks.

## 2021-10-07 NOTE — Discharge Instructions (Addendum)
Your ultrasound today showed: IMPRESSION: Findings suggestive of adenomyosis and intramural uterine fibroids.

## 2021-10-07 NOTE — ED Notes (Signed)
Pt verbalized understanding of discharge instructions, prescriptions, and follow-up care instructions. Pt advised if symptoms worsen to return to ED.  

## 2021-10-07 NOTE — ED Notes (Signed)
Paper blood consent obtained. See paper chart.

## 2021-10-08 LAB — TYPE AND SCREEN
ABO/RH(D): O POS
Antibody Screen: NEGATIVE
Unit division: 0

## 2021-10-08 LAB — BPAM RBC
Blood Product Expiration Date: 202306202359
ISSUE DATE / TIME: 202306161729
Unit Type and Rh: 5100

## 2021-10-11 ENCOUNTER — Other Ambulatory Visit: Payer: Self-pay | Admitting: Physician Assistant

## 2021-10-11 DIAGNOSIS — Z1231 Encounter for screening mammogram for malignant neoplasm of breast: Secondary | ICD-10-CM

## 2022-11-18 IMAGING — US US PELVIS COMPLETE WITH TRANSVAGINAL
1 series · 15 of 25 positions shown · non-contrast
Comparison: CT abdomen pelvis 05/19/2012. ultrasound pelvis
04/06/2009

CLINICAL DATA: [id number].  Vaginal bleeding

EXAM:
TRANSABDOMINAL AND TRANSVAGINAL ULTRASOUND OF PELVIS
TECHNIQUE: Both transabdominal and transvaginal ultrasound examinations of the
pelvis were performed. Transabdominal technique was performed for
global imaging of the pelvis including uterus, ovaries, adnexal
regions, and pelvic cul-de-sac. It was necessary to proceed with
endovaginal exam following the transabdominal exam to visualize the
endometrium, uterus, left ovary.

[Series 1: us pelvis complete · 15 of 69 slices shown]
[im 1/69]
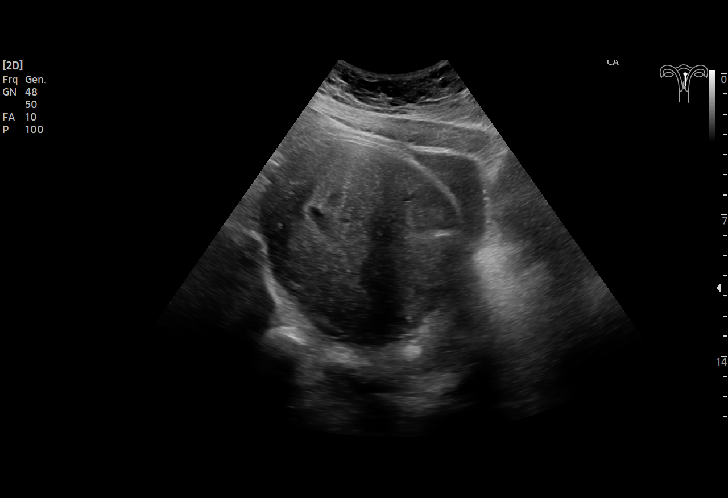
[im 6/69]
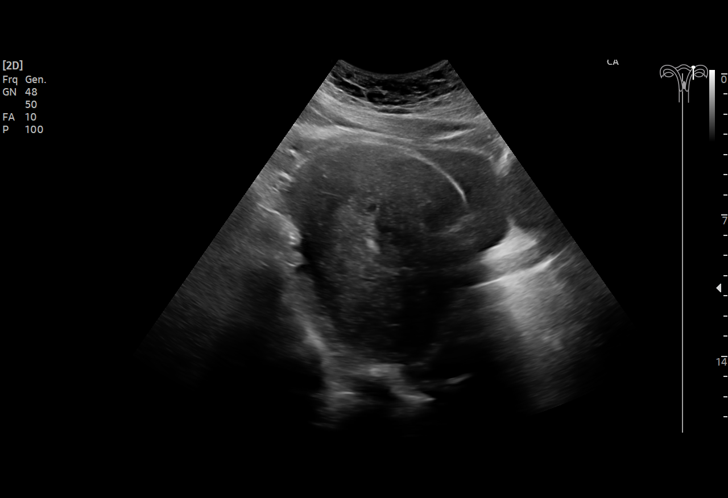
[im 12/69]
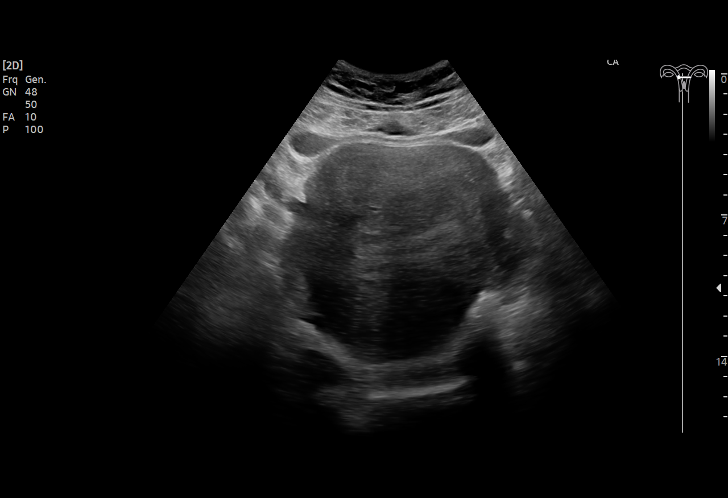
[im 15/69]
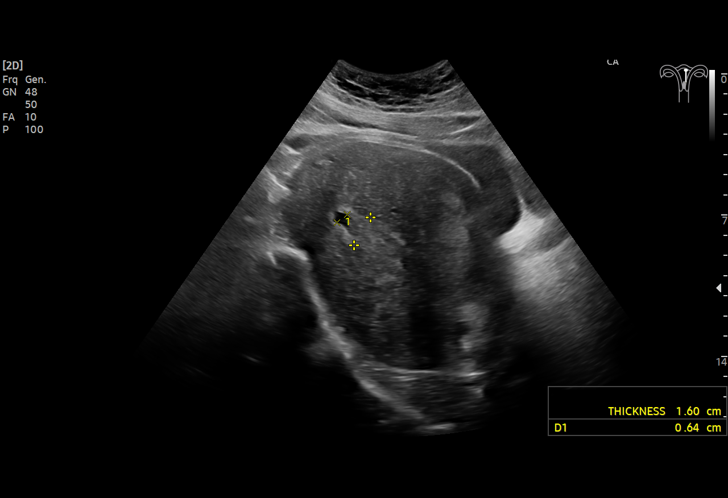
[im 20/69]
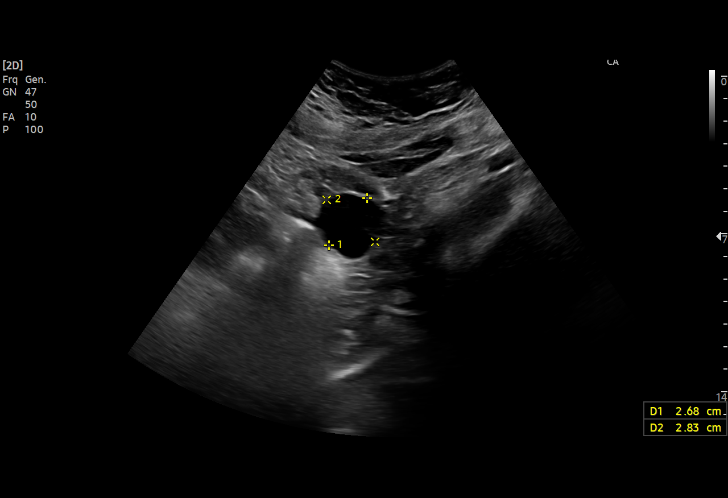
[im 26/69]
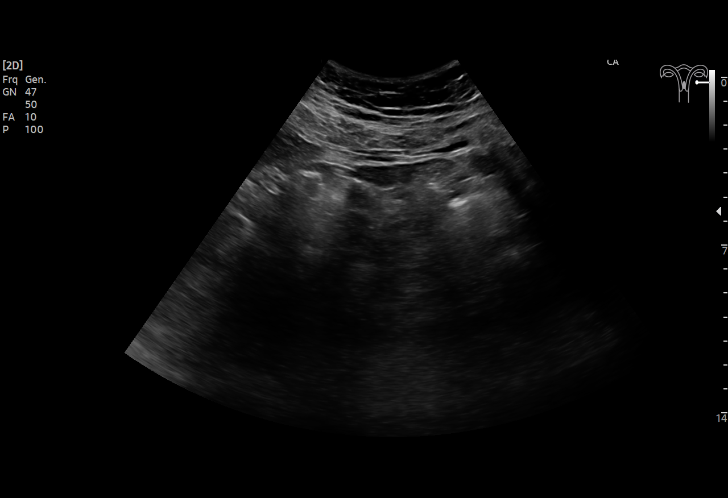
[im 29/69]
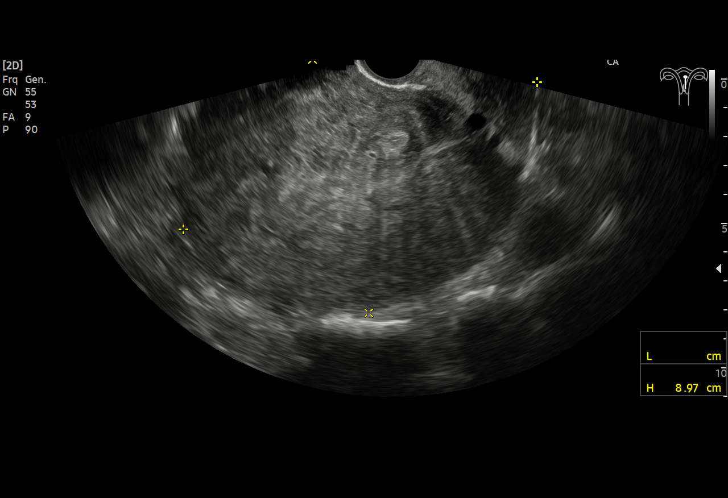
[im 35/69]
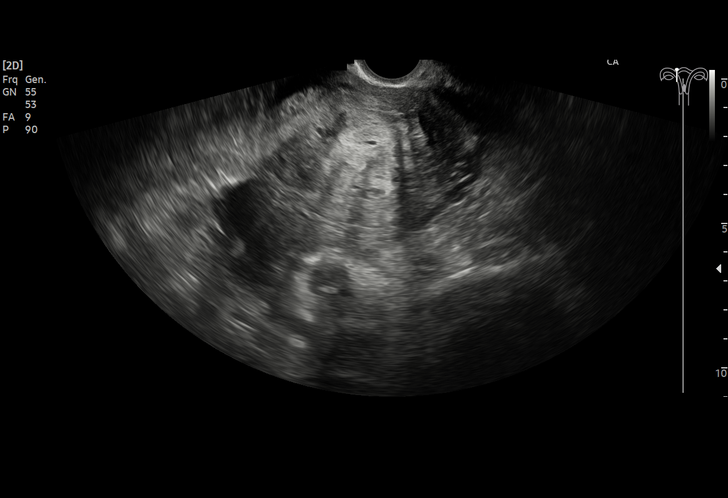
[im 40/69]
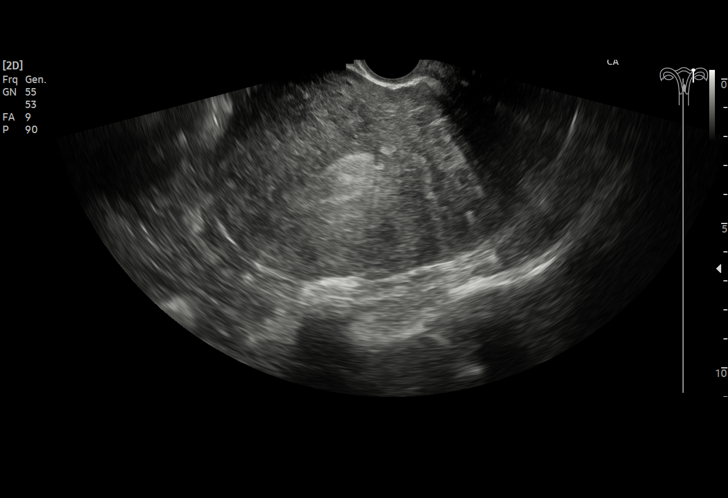
[im 43/69]
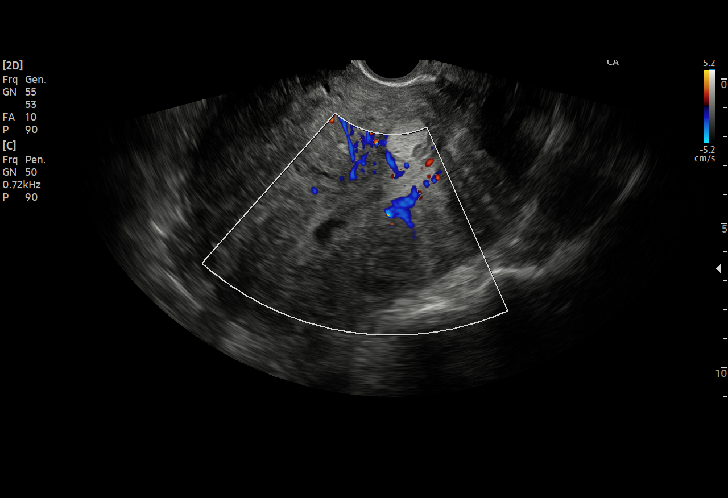
[im 49/69]
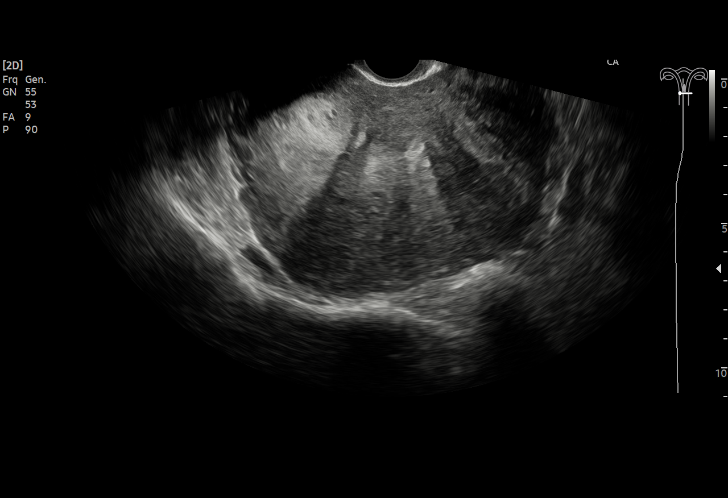
[im 54/69]
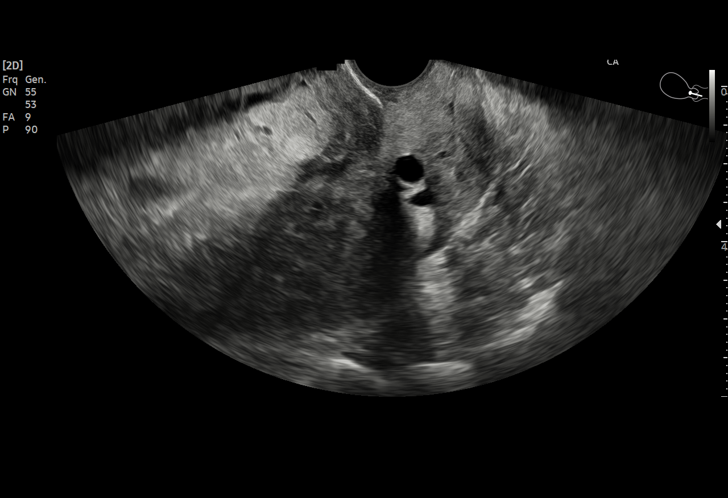
[im 57/69]
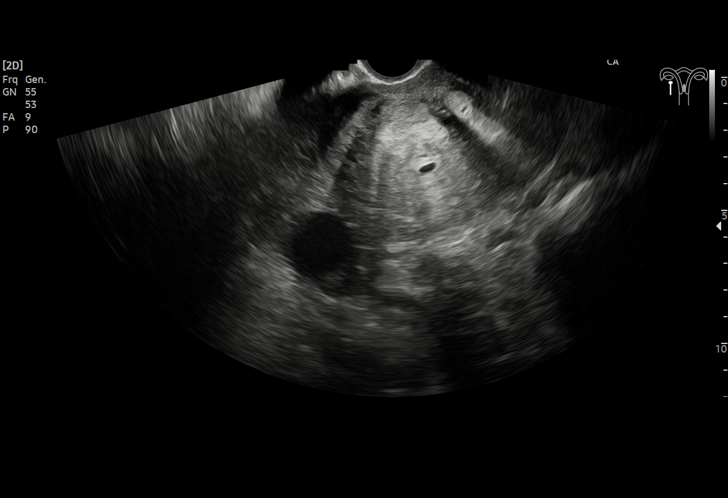
[im 63/69]
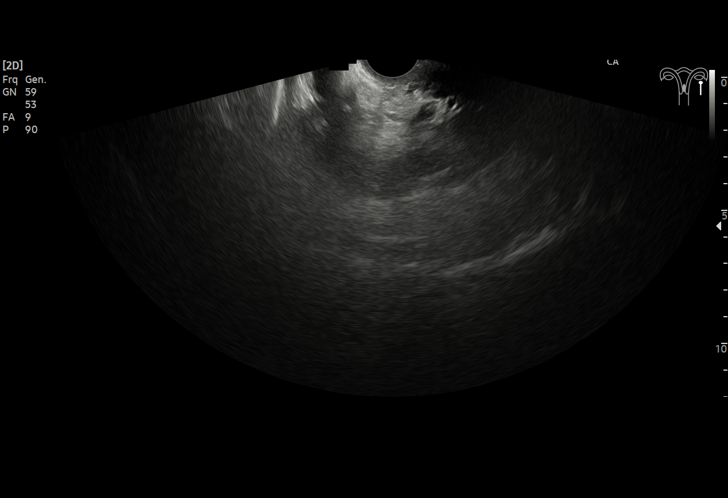
[im 69/69]
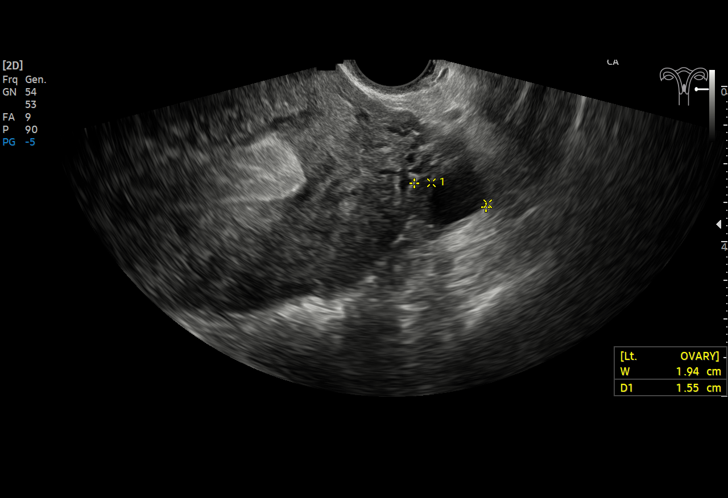

[15 of 25 positions shown; findings below may reference images not displayed]

FINDINGS: Uterus

Measurements: 13.2 x 9 x 11.2 cm = volume: 694 mL. Globular
heterogeneous uterus. Subcentimeter cystic changes of the
myometrium. Couple of intramural uterine masses measuring 6.8 x
x 3.7 cm and 5.8 x 4.4 x 4.9 cm.

Endometrium

Thickness: 10 mm. Fluid noted within the endometrium. No definite
endometrial mass on transvaginal ultrasound.

Right ovary

Measurements: 3.8 x 2.6 x 3.1 cm = volume: 16 mL. Normal
appearance/no adnexal mass.

Left ovary

Measurements: 3 x 1.9 x 1.9 cm = volume: 5.7 mL. Normal
appearance/no adnexal mass.

Other findings

No abnormal free fluid.
IMPRESSION: Findings suggestive of adenomyosis and intramural uterine fibroids.

## 2023-09-21 DIAGNOSIS — N950 Postmenopausal bleeding: Secondary | ICD-10-CM | POA: Insufficient documentation

## 2023-10-19 ENCOUNTER — Encounter (HOSPITAL_COMMUNITY): Payer: Self-pay

## 2023-10-19 ENCOUNTER — Inpatient Hospital Stay
Admission: EM | Admit: 2023-10-19 | Discharge: 2023-10-20 | DRG: 175 | Disposition: A | Discharge status: Other Acute Inpatient Hospital | Payer: MEDICAID | Attending: Student in an Organized Health Care Education/Training Program | Admitting: Student in an Organized Health Care Education/Training Program

## 2023-10-19 ENCOUNTER — Other Ambulatory Visit: Payer: Self-pay

## 2023-10-19 ENCOUNTER — Emergency Department: Payer: Self-pay

## 2023-10-19 DIAGNOSIS — R7989 Other specified abnormal findings of blood chemistry: Secondary | ICD-10-CM | POA: Diagnosis present

## 2023-10-19 DIAGNOSIS — K219 Gastro-esophageal reflux disease without esophagitis: Secondary | ICD-10-CM | POA: Diagnosis present

## 2023-10-19 DIAGNOSIS — Z888 Allergy status to other drugs, medicaments and biological substances status: Secondary | ICD-10-CM

## 2023-10-19 DIAGNOSIS — E66812 Obesity, class 2: Secondary | ICD-10-CM | POA: Diagnosis present

## 2023-10-19 DIAGNOSIS — F419 Anxiety disorder, unspecified: Secondary | ICD-10-CM | POA: Diagnosis present

## 2023-10-19 DIAGNOSIS — Z833 Family history of diabetes mellitus: Secondary | ICD-10-CM

## 2023-10-19 DIAGNOSIS — I10 Essential (primary) hypertension: Secondary | ICD-10-CM | POA: Diagnosis present

## 2023-10-19 DIAGNOSIS — F4310 Post-traumatic stress disorder, unspecified: Secondary | ICD-10-CM | POA: Diagnosis present

## 2023-10-19 DIAGNOSIS — I2699 Other pulmonary embolism without acute cor pulmonale: Secondary | ICD-10-CM | POA: Diagnosis present

## 2023-10-19 DIAGNOSIS — Z8249 Family history of ischemic heart disease and other diseases of the circulatory system: Secondary | ICD-10-CM

## 2023-10-19 DIAGNOSIS — D259 Leiomyoma of uterus, unspecified: Secondary | ICD-10-CM | POA: Diagnosis present

## 2023-10-19 DIAGNOSIS — I5A Non-ischemic myocardial injury (non-traumatic): Secondary | ICD-10-CM | POA: Diagnosis present

## 2023-10-19 DIAGNOSIS — J9601 Acute respiratory failure with hypoxia: Secondary | ICD-10-CM | POA: Diagnosis present

## 2023-10-19 DIAGNOSIS — E785 Hyperlipidemia, unspecified: Secondary | ICD-10-CM | POA: Diagnosis present

## 2023-10-19 DIAGNOSIS — D72829 Elevated white blood cell count, unspecified: Secondary | ICD-10-CM | POA: Diagnosis present

## 2023-10-19 DIAGNOSIS — E039 Hypothyroidism, unspecified: Secondary | ICD-10-CM | POA: Diagnosis present

## 2023-10-19 DIAGNOSIS — F988 Other specified behavioral and emotional disorders with onset usually occurring in childhood and adolescence: Secondary | ICD-10-CM | POA: Diagnosis present

## 2023-10-19 DIAGNOSIS — Z79890 Hormone replacement therapy (postmenopausal): Secondary | ICD-10-CM

## 2023-10-19 DIAGNOSIS — Z87891 Personal history of nicotine dependence: Secondary | ICD-10-CM

## 2023-10-19 DIAGNOSIS — E876 Hypokalemia: Secondary | ICD-10-CM | POA: Diagnosis present

## 2023-10-19 DIAGNOSIS — I2609 Other pulmonary embolism with acute cor pulmonale: Principal | ICD-10-CM | POA: Diagnosis present

## 2023-10-19 DIAGNOSIS — F32A Depression, unspecified: Secondary | ICD-10-CM | POA: Diagnosis present

## 2023-10-19 DIAGNOSIS — Z79899 Other long term (current) drug therapy: Secondary | ICD-10-CM

## 2023-10-19 DIAGNOSIS — Z1152 Encounter for screening for COVID-19: Secondary | ICD-10-CM

## 2023-10-19 DIAGNOSIS — E669 Obesity, unspecified: Secondary | ICD-10-CM | POA: Diagnosis present

## 2023-10-19 HISTORY — DX: Hypothyroidism, unspecified: E03.9

## 2023-10-19 LAB — BASIC METABOLIC PANEL WITH GFR
Anion gap: 10 (ref 5–15)
BUN: 9 mg/dL (ref 6–20)
CO2: 21 mmol/L — ABNORMAL LOW (ref 22–32)
Calcium: 9.2 mg/dL (ref 8.9–10.3)
Chloride: 109 mmol/L (ref 98–111)
Creatinine, Ser: 0.9 mg/dL (ref 0.44–1.00)
GFR, Estimated: >60 mL/min (ref >60)
Glucose, Bld: 109 mg/dL — ABNORMAL HIGH (ref 70–99)
Potassium: 3.4 mmol/L — ABNORMAL LOW (ref 3.5–5.1)
Sodium: 140 mmol/L (ref 135–145)

## 2023-10-19 LAB — TROPONIN I (HIGH SENSITIVITY)
Troponin I (High Sensitivity): 393 ng/L (ref <18)
Troponin I (High Sensitivity): 409 ng/L (ref <18)

## 2023-10-19 LAB — CBC
HCT: 30.9 % — ABNORMAL LOW (ref 36.0–46.0)
Hemoglobin: 9.8 g/dL — ABNORMAL LOW (ref 12.0–15.0)
MCH: 26.1 pg (ref 26.0–34.0)
MCHC: 31.7 g/dL (ref 30.0–36.0)
MCV: 82.2 fL (ref 80.0–100.0)
Platelets: 466 10*3/uL — ABNORMAL HIGH (ref 150–400)
RBC: 3.76 MIL/uL — ABNORMAL LOW (ref 3.87–5.11)
RDW: 17.5 % — ABNORMAL HIGH (ref 11.5–15.5)
WBC: 15.2 10*3/uL — ABNORMAL HIGH (ref 4.0–10.5)
nRBC: 0 % (ref 0.0–0.2)

## 2023-10-19 LAB — RESP PANEL BY RT-PCR (RSV, FLU A&B, COVID)  RVPGX2
Influenza A by PCR: NEGATIVE
Influenza B by PCR: NEGATIVE
Resp Syncytial Virus by PCR: NEGATIVE
SARS Coronavirus 2 by RT PCR: NEGATIVE

## 2023-10-19 LAB — HEPATIC FUNCTION PANEL
ALT: 12 U/L (ref 0–44)
AST: 28 U/L (ref 15–41)
Albumin: 3.3 g/dL — ABNORMAL LOW (ref 3.5–5.0)
Alkaline Phosphatase: 22 U/L — ABNORMAL LOW (ref 38–126)
Bilirubin, Direct: <0.1 mg/dL (ref 0.0–0.2)
Total Bilirubin: 0.4 mg/dL (ref 0.0–1.2)
Total Protein: 7.3 g/dL (ref 6.5–8.1)

## 2023-10-19 LAB — HEMOGLOBIN AND HEMATOCRIT, BLOOD
HCT: 30.9 % — ABNORMAL LOW (ref 36.0–46.0)
Hemoglobin: 9.6 g/dL — ABNORMAL LOW (ref 12.0–15.0)

## 2023-10-19 LAB — LACTIC ACID, PLASMA
Lactic Acid, Venous: 3.4 mmol/L (ref 0.5–1.9)
Lactic Acid, Venous: 3 mmol/L (ref 0.5–1.9)
Lactic Acid, Venous: 3.9 mmol/L (ref 0.5–1.9)

## 2023-10-19 LAB — URINALYSIS, ROUTINE W REFLEX MICROSCOPIC
Bacteria, UA: NONE SEEN
RBC / HPF: >50 RBC/hpf (ref 0–5)
Squamous Epithelial / HPF: 0 /HPF (ref 0–5)
WBC, UA: >50 WBC/hpf (ref 0–5)

## 2023-10-19 LAB — APTT: aPTT: 27 s (ref 24–36)

## 2023-10-19 LAB — GLUCOSE, CAPILLARY: Glucose-Capillary: 177 mg/dL — ABNORMAL HIGH (ref 70–99)

## 2023-10-19 LAB — TYPE AND SCREEN
ABO/RH(D): O POS
Antibody Screen: NEGATIVE

## 2023-10-19 LAB — PROTIME-INR
INR: 1 (ref 0.8–1.2)
Prothrombin Time: 14.2 s (ref 11.4–15.2)

## 2023-10-19 MED ORDER — HEPARIN (PORCINE) 25000 UT/250ML-% IV SOLN
1350.0000 [IU]/h | INTRAVENOUS | Status: DC
  Administered 2023-10-19: 1100 [IU]/h via INTRAVENOUS
  Filled 2023-10-19: qty 250

## 2023-10-19 MED ORDER — POLYETHYLENE GLYCOL 3350 17 G PO PACK: 17.0000 g | PACK | Freq: Every day | ORAL | Status: DC | PRN

## 2023-10-19 MED ORDER — CHLORHEXIDINE GLUCONATE CLOTH 2 % EX PADS: 6.0000 | MEDICATED_PAD | Freq: Every day | CUTANEOUS | Status: DC

## 2023-10-19 MED ORDER — IOHEXOL 350 MG/ML SOLN
75.0000 mL | Freq: Once | INTRAVENOUS | Status: AC | PRN
  Administered 2023-10-19: 75 mL via INTRAVENOUS

## 2023-10-19 MED ORDER — MORPHINE SULFATE (PF) 2 MG/ML IV SOLN
2.0000 mg | INTRAVENOUS | Status: DC | PRN
  Administered 2023-10-19: 2 mg via INTRAVENOUS
  Filled 2023-10-19: qty 1

## 2023-10-19 MED ORDER — HEPARIN BOLUS VIA INFUSION
4000.0000 [IU] | Freq: Once | INTRAVENOUS | Status: AC
  Administered 2023-10-19: 4000 [IU] via INTRAVENOUS
  Filled 2023-10-19: qty 4000

## 2023-10-19 MED ORDER — DOCUSATE SODIUM 100 MG PO CAPS
100.0000 mg | ORAL_CAPSULE | Freq: Two times a day (BID) | ORAL | Status: DC | PRN
Start: 2023-10-19 — End: 2023-10-20

## 2023-10-19 MED ORDER — LACTATED RINGERS IV BOLUS
1000.0000 mL | Freq: Once | INTRAVENOUS | Status: AC
  Administered 2023-10-19: 1000 mL via INTRAVENOUS

## 2023-10-19 MED ORDER — POTASSIUM CHLORIDE 10 MEQ/100ML IV SOLN
10.0000 meq | INTRAVENOUS | Status: AC
  Administered 2023-10-19 – 2023-10-20 (×2): 10 meq via INTRAVENOUS
  Filled 2023-10-19 (×2): qty 100

## 2023-10-19 MED ORDER — NOREPINEPHRINE 4 MG/250ML-% IV SOLN
0.0000 ug/min | INTRAVENOUS | Status: DC
  Administered 2023-10-20: 0 ug/min via INTRAVENOUS
  Filled 2023-10-19: qty 250

## 2023-10-19 MED ORDER — MILRINONE LACTATE IN DEXTROSE 20-5 MG/100ML-% IV SOLN
0.2500 ug/kg/min | INTRAVENOUS | Status: DC
  Administered 2023-10-19: 0.25 ug/kg/min via INTRAVENOUS
  Filled 2023-10-19: qty 100

## 2023-10-19 MED ORDER — POTASSIUM CHLORIDE CRYS ER 20 MEQ PO TBCR: 40.0000 meq | EXTENDED_RELEASE_TABLET | Freq: Once | ORAL | Status: DC

## 2023-10-19 MED ORDER — SODIUM CHLORIDE 0.9 % IV SOLN
250.0000 mL | INTRAVENOUS | Status: DC
  Administered 2023-10-19: 250 mL via INTRAVENOUS

## 2023-10-19 MED ORDER — MORPHINE SULFATE (PF) 2 MG/ML IV SOLN
2.0000 mg | Freq: Once | INTRAVENOUS | Status: AC
  Administered 2023-10-19: 2 mg via INTRAVENOUS
  Filled 2023-10-19: qty 1

## 2023-10-19 NOTE — Progress Notes (Signed)
 eLink Physician-Brief Progress Note Patient Name: Anita Aguirre DOB: 1976/04/12 MRN: [id number]   Date of Service  10/19/2023  HPI/Events of Note  Patient admitted with bilateral PE with evidence of right heart strain.  eICU Interventions  New Patient Evaluation.        Adrieanna Boteler U Taliah Porche 10/19/2023, 9:53 PM

## 2023-10-19 NOTE — Progress Notes (Addendum)
 This is a no charge note   Patient is a 48 year old old lady, with past medical history of hypertension, hypothyroidism, depression, ADD, PTSD, mastitis, migraine, uterine fibroid with active bleeding, who presents with SOB and chest pain.  Patient states that she has SOB for almost a week, which has progressively worsening.  She does not have cough, no fever or chills.  She reports chest pain, which is located in the front chest, dull, mild, nonradiating, not pleuritic, not aggravated by deep breath.  She states that she had pain in the left calf area 4 days ago which has resolved.  No recent long distant traveling.  Patient is currently taking Provera  due to active uterine fibroid bleeding.  Denies nausea, vomiting, diarrhea or abdominal pain.  No symptoms of UTI.  No recent fall or head injury.  Patient is found to have WBC 15.2, GFR> 60, potassium 3.4, INR 1.0, PTT 27, hemoglobin 9.8, negative PCR for COVID, flu and RSV, troponin 393 --> 409, lactic acid 3.4.  Temperature normal, blood pressure 116/74, heart rate 118, RR 35 --> 26, oxygen saturation 94% on 2 L oxygen (patient is not on oxygen normally).     CTA showed bilateral PE with severe clot burden and CT evidence of right heart strain.  Patient will need thrombectomy, however vascular surgery on-call today does not do this procedure.  ED physician consulted IR, waiting for the decision to see if procedure can be done here.  Admission is canceled at this moment.   CTA: 1. Bilateral pulmonary emboli with severe clot burden. 2. CT evidence of RIGHT ventricular strain. Positive for acute PE with CT evidence of right heart strain (RV/LV Ratio = 1.4) consistent with at least submassive (intermediate risk) PE. The presence of right heart strain has been associated with an increased risk of morbidity and mortality. Please refer to the Code PE Focused order set in EPIC. 3. Small pulmonary infarction in the RIGHT lower lobe. 4. Moderate  hiatal hernia.     Caleb Exon, MD  [facility]   If 7PM-7AM, please contact night-coverage [email or web address] 10/19/2023, 7:08 PM

## 2023-10-19 NOTE — Progress Notes (Signed)
 Interventional Radiology consulted about submassive PE. Reviewed Chest CTA from earlier today.  Overall clot burden is moderate-large but the central clot is small and primarily non-occlusive.  Some of the largest clots are in segmental branches.  As a result, patient is not a great mechanical thrombectomy candidate.  The clot distribution is more amenable for catheter-directed thrombolysis but reportedly the patient is having active vaginal bleeding which is a contraindication to thrombolytic therapy. Discussed with Dr. Isadora and the plan is to have the patient transferred to [facility].  IR can evaluate patient after she is transferred.

## 2023-10-19 NOTE — ED Triage Notes (Signed)
 Pt comes in from home with complaints of shortness of breath the past 3-4 days that got worse today. Pt states that she feels extreme SOB with exertion. Pt has a history of anemia, and had blood drawn this week with a hemoglobin of 8. Pt is alert and oriented x4, with labored breathing at this time.

## 2023-10-19 NOTE — ED Provider Notes (Signed)
 [facility] Provider Note    Event Date/Time   First MD Initiated Contact with Patient 10/19/23 1552     (approximate)   History   Chief Complaint Shortness of Breath   HPI  Anita Aguirre is a 48 y.o. female with past medical history of hypertension,, GERD, and uterine fibroids who presents to the ED complaining of shortness of breath.  Patient reports that she has been feeling increasingly short of breath for the past week but today symptoms got so bad that she was having trouble walking due to the difficulty breathing.  She denies any associated fevers, cough, or pain in her chest.  She does report mild swelling in both of her legs, but states this is a chronic issue and denies pain in either calf.  She reports some similar difficulty breathing when she was severely anemic a couple of years ago, does report ongoing vaginal bleeding that was heavy enough last week that she had to change a pad every 30 minutes.  She states that bleeding has improved this week and she is only changing a pad every hour and a half.  She does not take a blood thinner, reports history of uterine fibroids.     Physical Exam   Triage Vital Signs: ED Triage Vitals [10/19/23 1551]  Encounter Vitals Group     BP      Girls Systolic BP Percentile      Girls Diastolic BP Percentile      Boys Systolic BP Percentile      Boys Diastolic BP Percentile      Pulse      Resp      Temp      Temp src      SpO2      Weight 240 lb 4.8 oz (109 kg)     Height 5' 7 (1.702 m)     Head Circumference      Peak Flow      Pain Score 0     Pain Loc      Pain Education      Exclude from Growth Chart     Most recent vital signs: Vitals:   10/19/23 2008 10/19/23 2010  BP:    Pulse: (!) 124 (!) 124  Resp: (!) 24 20  Temp:    SpO2: 92% 93%    Constitutional: Alert and oriented. Eyes: Conjunctivae are normal. Head: Atraumatic. Nose: No congestion/rhinnorhea. Mouth/Throat: Mucous  membranes are moist.  Cardiovascular: Tachycardic, regular rhythm. Grossly normal heart sounds.  2+ radial pulses bilaterally. Respiratory: Tachypneic with normal respiratory effort.  No retractions. Lungs CTAB. Gastrointestinal: Soft and nontender. No distention. Musculoskeletal: No lower extremity tenderness nor edema.  Neurologic:  Normal speech and language. No gross focal neurologic deficits are appreciated.    ED Results / Procedures / Treatments   Labs (all labs ordered are listed, but only abnormal results are displayed) Labs Reviewed  BASIC METABOLIC PANEL WITH GFR - Abnormal; Notable for the following components:      Result Value   Potassium 3.4 (*)    CO2 21 (*)    Glucose, Bld 109 (*)    All other components within normal limits  CBC - Abnormal; Notable for the following components:   WBC 15.2 (*)    RBC 3.76 (*)    Hemoglobin 9.8 (*)    HCT 30.9 (*)    RDW 17.5 (*)    Platelets 466 (*)    All other components  within normal limits  LACTIC ACID, PLASMA - Abnormal; Notable for the following components:   Lactic Acid, Venous 3.4 (*)    All other components within normal limits  LACTIC ACID, PLASMA - Abnormal; Notable for the following components:   Lactic Acid, Venous 3.0 (*)    All other components within normal limits  HEPATIC FUNCTION PANEL - Abnormal; Notable for the following components:   Albumin 3.3 (*)    Alkaline Phosphatase 22 (*)    All other components within normal limits  HEMOGLOBIN AND HEMATOCRIT, BLOOD - Abnormal; Notable for the following components:   Hemoglobin 9.6 (*)    HCT 30.9 (*)    All other components within normal limits  TROPONIN I (HIGH SENSITIVITY) - Abnormal; Notable for the following components:   Troponin I (High Sensitivity) 393 (*)    All other components within normal limits  TROPONIN I (HIGH SENSITIVITY) - Abnormal; Notable for the following components:   Troponin I (High Sensitivity) 409 (*)    All other components within  normal limits  RESP PANEL BY RT-PCR (RSV, FLU A&B, COVID)  RVPGX2  CULTURE, BLOOD (ROUTINE X 2)  CULTURE, BLOOD (ROUTINE X 2)  APTT  PROTIME-INR  URINALYSIS, ROUTINE W REFLEX MICROSCOPIC  HEPARIN LEVEL (UNFRACTIONATED)  POC URINE PREG, ED  TYPE AND SCREEN     EKG  ED ECG REPORT I, Carlin Palin, the attending physician, personally viewed and interpreted this ECG.   Date: 10/19/2023  EKG Time: 15:55  Rate: 114  Rhythm: sinus tachycardia  Axis: Normal  Intervals:none  ST&T Change: None  RADIOLOGY Chest x-ray reviewed and interpreted by me with no infiltrate, edema, or effusion.  PROCEDURES:  Critical Care performed: Yes, see critical care procedure note(s)  .Critical Care  Performed by: Palin Carlin, MD Authorized by: Palin Carlin, MD   Critical care provider statement:    Critical care time (minutes):  30   Critical care time was exclusive of:  Separately billable procedures and treating other patients and teaching time   Critical care was necessary to treat or prevent imminent or life-threatening deterioration of the following conditions:  Respiratory failure and cardiac failure   Critical care was time spent personally by me on the following activities:  Development of treatment plan with patient or surrogate, discussions with consultants, evaluation of patient's response to treatment, examination of patient, ordering and review of laboratory studies, ordering and review of radiographic studies, ordering and performing treatments and interventions, pulse oximetry, re-evaluation of patient's condition and review of old charts   I assumed direction of critical care for this patient from another provider in my specialty: no     Care discussed with: admitting provider      MEDICATIONS ORDERED IN ED: Medications  heparin ADULT infusion 100 units/mL (25000 units/250mL) (1,100 Units/hr Intravenous New Bag/Given 10/19/23 1726)  lactated ringers bolus 1,000 mL (0 mLs  Intravenous Stopped 10/19/23 1954)  heparin bolus via infusion 4,000 Units (4,000 Units Intravenous Bolus from Bag 10/19/23 1727)  iohexol (OMNIPAQUE) 350 MG/ML injection 75 mL (75 mLs Intravenous Contrast Given 10/19/23 1739)     IMPRESSION / MDM / ASSESSMENT AND PLAN / ED COURSE  I reviewed the triage vital signs and the nursing notes.                              48 y.o. female with past medical history of hypertension, GERD, and uterine fibroids who presents to the  ED complaining of 1 week of increasing difficulty breathing, especially with exertion.  Patient's presentation is most consistent with acute presentation with potential threat to life or bodily function.  Differential diagnosis includes, but is not limited to, ACS, PE, pneumonia, pneumothorax, COPD, CHF, anemia, electrolyte abnormality, AKI.  Patient ill-appearing, tachycardic and tachypneic with oxygen saturations noted to be 90% on room air.  She was placed on 2 L nasal cannula with improvement in oxygen saturations, remains tachypneic but lungs are clear to auscultation bilaterally.  She does have some anemia, but not to the degree that would explain her symptoms.  EKG shows sinus tachycardia without ischemic changes, however troponin significantly elevated at 393.  Suspicion is high for pulmonary embolism at this time, we will start on IV heparin and further assess with CTA of her chest.  No significant electrolyte abnormality or AKI noted, she does have a leukocytosis and sepsis considered, but chest x-ray is clear and no other source of infection noted at this time.  CTA chest is positive for pulmonary embolism with evidence of right heart strain.  Case discussed with Dr. Serene of vascular surgery, who does not perform thrombectomy.  Case discussed with Dr. Philip of interventional radiology, who is available for thrombectomy but does not feel patient would be a good candidate for the procedure given scattered clot burden.   Patient was evaluated by Dr. Hilma of the hospitalist service, who feels patient would benefit from higher level of care.  Transfer considered, but risks seem to outweigh benefits at this time as there are no services available at tertiary care center that could not be provided here at [facility].  Case discussed with ICU team for admission.     FINAL CLINICAL IMPRESSION(S) / ED DIAGNOSES   Final diagnoses:  Acute pulmonary embolism with acute cor pulmonale, unspecified pulmonary embolism type (HCC)     Rx / DC Orders   ED Discharge Orders     None        Note:  This document was prepared using Dragon voice recognition software and may include unintentional dictation errors.   Willo Dunnings, MD 10/19/23 2032

## 2023-10-19 NOTE — ED Notes (Signed)
 Pt informed of needing a urine sample. Unable to provide sample at this time.

## 2023-10-19 NOTE — Consult Note (Signed)
 Pharmacy Consult Note - Anticoagulation  Pharmacy Consult for heparin Indication: chest pain/ACS  PATIENT MEASUREMENTS: Height: 5' 7 (170.2 cm) Weight: 109 kg (240 lb 4.8 oz) IBW/kg (Calculated) : 61.6 HEPARIN DW (KG): 86.6  VITAL SIGNS: Temp: 98.8 F (37.1 C) (06/27 1557) Temp Source: Oral (06/27 1557) BP: 125/85 (06/27 1557) Pulse Rate: 114 (06/27 1557)  Recent Labs    10/19/23 1559  HGB 9.8*  HCT 30.9*  PLT 466*  CREATININE 0.90  TROPONINIHS 393*    Estimated Creatinine Clearance: 97.3 mL/min (by C-G formula based on SCr of 0.9 mg/dL).  PAST MEDICAL HISTORY: Past Medical History:  Diagnosis Date   ADD (attention deficit disorder)    Arthritis 04/24/2008   Diffuse cystic mastopathy 04/24/2010   LEFT    Hypertension    Mastitis     ASSESSMENT: 48 y.o. female with PMH including PTSD, migraines, GERD is presenting with chest pain. cTn elevated at 393. Patient is not on chronic anticoagulation per chart review. Patient also endorses ongoing vaginal bleeding, hx uterine fibroids. Hgb currently 9.8. Pharmacy has been consulted to initiate and manage heparin intravenous infusion. Additional baseline labs have been ordered.  Pertinent medications: No chronic anticoag PTA  Goal(s) of therapy: Heparin level 0.3 - 0.7 units/mL Monitor platelets by anticoagulation protocol: Yes   Baseline anticoagulation labs: Recent Labs    10/19/23 1559  HGB 9.8*  PLT 466*    Date Time aPTT/HL Rate/Comment     PLAN: Give 4000 units bolus x1; then start heparin infusion at 1100 units/hour. Check heparin level in 6 hours, then daily once at least two levels are consecutively therapeutic. Monitor CBC daily while on heparin infusion especially given heavy vaginal bleeding   Will M. Lenon, PharmD Clinical Pharmacist 10/19/2023 5:00 PM

## 2023-10-19 NOTE — ED Notes (Addendum)
 Pt refused bed pan at this time and requested to go to bathroom inside her room. After using the bathroom pt had increased respirations and increase in chest pain. Provider at bedside. Orders received. Pt placed on non-rebreather at this time.

## 2023-10-19 NOTE — H&P (Incomplete)
 NAME:  Anita Aguirre, MRN:  [id number], DOB:  10/20/1975, LOS: 0 ADMISSION DATE:  10/19/2023, CONSULTATION DATE: 10/19/2023 REFERRING MD: Willo Dunnings, CHIEF COMPLAINT: Shortness of breath   HPI  48 y.o female with significant PMH of HTN, hypothyroidism, anxiety and depression, ADD, PTSD, mastitis, migraine headache without complication, and uterine fibroid with active bleeding who presented to the ED with chief complaints of worsening shortness of breath and chest pain.  Patient report onset of symptoms for almost a week, which has progressively worsened today with associated chest pain and difficulty ambulating.  She has history of uterine fibroid with intermittent episode of heavy bleeding.  She was started on Depo-Provera  about 2 years ago which she has been on and off due to active bleeding.  Patient complained of left calf pain about 4 days ago which is resolved but remained short of breath with moderate intrauterine bleeding.   ED Course: Initial vital signs showed HR of 114 beats/minute, BP 125/85 mm Hg, the RR 35 breaths/minute, and the oxygen saturation 94% on 2 L and a temperature of 98.70F (37.1C). Pertinent Labs/Diagnostics Findings: Na+/ K+: 140/3.4.  Glucose: 109  WBC: 15.2 K/L without bands or neutrophil predominance  Hgb/Hct: 9.8/30.9  Lactic acid: 3.4 COVID PCR: Negative,  troponin: 393 CTA showed bilateral PE with severe clot burden and CT evidence of right heart strain Medication administered in the ED: Heparin bolus 4000 units with continuous infusion.  1 L LR bolus Disposition:PCCM consulted for ICU admission.   Past Medical History  HTN, hypothyroidism, anxiety and depression, ADD, PTSD, mastitis, migraine headache without complication, and uterine fibroid with active bleeding   Significant Hospital Events   6/27: Admitted to ICU with acute bilateral PE with severe clot burden and right heart strain.  Vascular consulted who deferred to IR.  IR recommends  transfer to [facility] for evaluation of catheter directed thrombolysis and ECMO watch. Accepted for transfer to [facility] 2H, pending transfer  Consults:  IR Vascular  Procedures:  None  Significant Diagnostic Tests:  10/19/23 CTA: 1. Bilateral pulmonary emboli with severe clot burden. 2. CT evidence of RIGHT ventricular strain. Positive for acute PE with CT evidence of right heart strain (RV/LV Ratio = 1.4) consistent with at least submassive (intermediate risk) PE. The presence of right heart strain has been associated with an increased risk of morbidity and mortality. Please refer to the Code PE Focused order set in EPIC. 3. Small pulmonary infarction in the RIGHT lower lobe. 4. Moderate hiatal hernia.   Interim History / Subjective:      Micro Data:  6/27: SARS-CoV-2 PCR> negative 6/27: Blood culture x2> 6/27: MRSA PCR>>   Antimicrobials:  None  OBJECTIVE  Blood pressure 109/85, pulse (!) 113, temperature 98 F (36.7 C), temperature source Oral, resp. rate (!) 29, height 5' 7 (1.702 m), weight 109 kg, SpO2 99%.       No intake or output data in the 24 hours ending 10/19/23 2334 Filed Weights   10/19/23 1551  Weight: 109 kg   Physical Examination  GENERAL: 48 year-old female critically ill patient lying in the bed in mild distress EYES: PEERLA. No scleral icterus. Extraocular muscles intact.  HEENT: Head atraumatic, normocephalic. Oropharynx and nasopharynx clear.  NECK:  No JVD, supple  LUNGS: Decreased breath sounds bilaterally.  Mild use of accessory muscles of respiration.  CARDIOVASCULAR: S1, S2 normal. No murmurs, rubs, or gallops.  ABDOMEN: Soft, NTND EXTREMITIES: No swelling or erythema.  Cap refill >  3 secs in all extremities. Pulses palpable distally. NEUROLOGIC: The patient is  alert and oriented x 4. No focal neurological deficit appreciated. Cranial nerves are intact.  SKIN: No obvious rash, lesion, or ulcer. Warm to touch Labs/imaging that I  havepersonally reviewed  (right click and Reselect all SmartList Selections daily)    CT Angio Chest PE W/Cm &/Or Wo Cm Result Date: 10/19/2023 CLINICAL DATA:  Short of breath.  Concern for pulmonary embolism. EXAM: CT ANGIOGRAPHY CHEST WITH CONTRAST TECHNIQUE: Multidetector CT imaging of the chest was performed using the standard protocol during bolus administration of intravenous contrast. Multiplanar CT image reconstructions and MIPs were obtained to evaluate the vascular anatomy. RADIATION DOSE REDUCTION: This exam was performed according to the departmental dose-optimization program which includes automated exposure control, adjustment of the mA and/or kV according to patient size and/or use of iterative reconstruction technique. CONTRAST:  75mL OMNIPAQUE IOHEXOL 350 MG/ML SOLN COMPARISON:  None Available. FINDINGS: Cardiovascular: There are filling defects within the proximal LEFT lower lobe pulmonary arteries. Filling defects are occlusive and partially occlusive. filling defects within the lingular pulmonary artery. Within the RIGHT lung, filling defects in the proximal segmental branches of the RIGHT lobe pulmonary artery and RIGHT upper lobe pulmonary artery Overall clot burden is severe. CT evidence of RIGHT ventricular strain with RIGHT ventricle to LEFT ventricle ratio equal 5.0:3.4 equal 1.4 Mediastinum/Nodes: No axillary or supraclavicular adenopathy. No mediastinal or hilar adenopathy. No pericardial fluid. Esophagus normal. Moderate hiatal hernia. Lungs/Pleura: Hazy nodule in the RIGHT lower lobe measuring 16 mm may represent small infarction. No large pulmonary infarction. Upper Abdomen: Limited view of the liver, kidneys, pancreas are unremarkable. Normal adrenal glands. Musculoskeletal: No aggressive osseous lesion. Review of the MIP images confirms the above findings. IMPRESSION: 1. Bilateral pulmonary emboli with severe clot burden. 2. CT evidence of RIGHT ventricular strain. Positive  for acute PE with CT evidence of right heart strain (RV/LV Ratio = 1.4) consistent with at least submassive (intermediate risk) PE. The presence of right heart strain has been associated with an increased risk of morbidity and mortality. Please refer to the Code PE Focused order set in EPIC. 3. Small pulmonary infarction in the RIGHT lower lobe. 4. Moderate hiatal hernia. Critical Value/emergent results were called by telephone at the time of interpretation on 10/19/2023 at 6:06 pm to provider [facility] , who verbally acknowledged these results. Electronically Signed   By: Jackquline Boxer M.D.   On: 10/19/2023 18:07   DG Chest 2 View Result Date: 10/19/2023 CLINICAL DATA:  sob EXAM: CHEST - 2 VIEW COMPARISON:  October 07, 2021 FINDINGS: Lower lung volumes with elevation of the right hemidiaphragm. No focal airspace consolidation, pleural effusion, or pneumothorax. No cardiomegaly.No acute fracture or destructive lesion. IMPRESSION: No acute cardiopulmonary abnormality. Electronically Signed   By: Rogelia Myers M.D.   On: 10/19/2023 16:11     Labs   CBC: Recent Labs  Lab 10/19/23 1559 10/19/23 1856  WBC 15.2*  --   HGB 9.8* 9.6*  HCT 30.9* 30.9*  MCV 82.2  --   PLT 466*  --     Basic Metabolic Panel: Recent Labs  Lab 10/19/23 1559  NA 140  K 3.4*  CL 109  CO2 21*  GLUCOSE 109*  BUN 9  CREATININE 0.90  CALCIUM 9.2   GFR: Estimated Creatinine Clearance: 97.3 mL/min (by C-G formula based on SCr of 0.9 mg/dL). Recent Labs  Lab 10/19/23 1559 10/19/23 1639 10/19/23 1825 10/19/23 2110  WBC 15.2*  --   --   --  LATICACIDVEN  --  3.4* 3.0* 3.9*    Liver Function Tests: Recent Labs  Lab 10/19/23 1559  AST 28  ALT 12  ALKPHOS 22*  BILITOT 0.4  PROT 7.3  ALBUMIN 3.3*   No results for input(s): LIPASE, AMYLASE in the last 168 hours. No results for input(s): AMMONIA in the last 168 hours.  ABG No results found for: PHART, PCO2ART, PO2ART, HCO3, TCO2,  ACIDBASEDEF, O2SAT   Coagulation Profile: Recent Labs  Lab 10/19/23 1710  INR 1.0    Cardiac Enzymes: No results for input(s): CKTOTAL, CKMB, CKMBINDEX, TROPONINI in the last 168 hours.  HbA1C: No results found for: HGBA1C  CBG: Recent Labs  Lab 10/19/23 2102  GLUCAP 177*    Review of Systems:   Review of Systems  Constitutional:  Positive for fever and malaise/fatigue.  HENT: Negative.    Eyes: Negative.   Respiratory:  Positive for shortness of breath.   Cardiovascular:  Positive for chest pain and palpitations.  Gastrointestinal: Negative.   Genitourinary: Negative.   Skin: Negative.   Neurological:  Positive for headaches.  Endo/Heme/Allergies: Negative.   Psychiatric/Behavioral:  Positive for depression. The patient is nervous/anxious.    Past Medical History  She,  has a past medical history of ADD (attention deficit disorder), Arthritis (04/24/2008), Diffuse cystic mastopathy (04/24/2010), Hypertension, Hypothyroidism, and Mastitis.   Surgical History    Past Surgical History:  Procedure Laterality Date   BREAST MASS EXCISION Left 2012   COLONOSCOPY  2005   [place] ? MD    INCISE AND DRAIN ABCESS  2010   MASTECTOMY, PARTIAL  2013     Social History   reports that she has quit smoking. Her smoking use included cigarettes. She has a 10 pack-year smoking history. She has never used smokeless tobacco. She reports that she does not drink alcohol and does not use drugs.   Family History   Her family history includes Diabetes in her paternal grandmother; Hypertension in her mother; Throat cancer (age of onset: 90) in her paternal aunt.   Allergies Allergies  Allergen Reactions   Bupropion Itching and Hives    Other Reaction(s): bugs under skin     Home Medications  Prior to Admission medications   Medication Sig Start Date End Date Taking? Authorizing Provider  amLODipine (NORVASC) 5 MG tablet Take 5 mg by mouth daily.   Yes [provider]  Ferrous Gluconate (KP FERROUS GLUCONATE) 324 (37.5 Fe) MG TABS Take 1 tablet by mouth daily. 12/27/21  Yes [provider]  levothyroxine (SYNTHROID) 88 MCG tablet Take 88 mcg by mouth daily before breakfast. 09/26/23  Yes [provider]  losartan-hydrochlorothiazide (HYZAAR) 50-12.5 MG tablet Take 1 tablet by mouth daily. 10/17/23  Yes [provider]  medroxyPROGESTERone  (PROVERA ) 10 MG tablet Take 2 tablets (20 mg total) by mouth 3 (three) times daily for 7 days. Use this dose only for bleeding that does not respond to once daily dosing. 09/06/18 10/19/23 Yes Leonce Garnette BIRCH, MD  medroxyPROGESTERone  (PROVERA ) 5 MG tablet Take 5 mg by mouth 3 (three) times daily. For 5-10 days for heavy bleeding   Yes [provider]  methylphenidate (RITALIN) 10 MG tablet Take 20 mg by mouth 2 (two) times daily. 08/06/18  Yes [provider]  Multiple Vitamin (MULTIVITAMIN WITH MINERALS) TABS Take 1 tablet by mouth daily.   Yes [provider]  propranolol (INDERAL) 10 MG tablet Take 10 mg by mouth daily as needed. 05/16/23  Yes [provider]  SM VITAMIN D3 50 MCG (2000 UT) CAPS Take 2,000 Units by mouth daily. 09/26/23  Yes [provider]  TRI-LO-ESTARYLLA 0.18/0.215/0.25 MG-25 MCG TABS Take 1 tablet by mouth daily. 09/24/23  Yes [provider]  ibuprofen  (ADVIL ) 800 MG tablet Take 1 tablet (800 mg total) by mouth every 8 (eight) hours as needed. Patient not taking: Reported on 10/19/2023 10/09/18   Leonce Garnette BIRCH, MD  levonorgestrel  (MIRENA ) 20 MCG/24HR IUD 1 Intra Uterine Device (1 each total) by Intrauterine route once for 1 dose. 09/26/18 09/26/18  Leonce Garnette BIRCH, MD  Scheduled Meds:  Chlorhexidine Gluconate Cloth  6 each Topical Daily   Continuous Infusions:  sodium chloride  Stopped (10/19/23 2222)   heparin 1,100 Units/hr (10/20/23 0000)   milrinone 0.25 mcg/kg/min (10/20/23 0000)   norepinephrine (LEVOPHED)  Adult infusion     potassium chloride  70 mL/hr at 10/20/23 0000   PRN Meds:.docusate sodium, morphine injection, polyethylene glycol   Active Hospital Problem list   See systems below  Assessment & Plan:  #Acute Bilateral PE with with CT evidence of right heart strain (RV/LV Ratio = 1.4)  Presented with acute onset chest pain and dyspnea. As far as etiology of PE, BMI is a risk factor as well as use of birth control, and possible underlying clotting disorder.   -Hemodynamic support with IVF + vasopressors  -Start Heparin gtt -Obtain hypercoagulable work-up  -Follow Troponin, BNP, serial EKG  -Obtain TTEcho  -Obtain Bilateral US  DVT -Consult IR/Vascular for  Thromobolysis vs. Embolectomy and IVC Filter placement if appropriate   #Acute Hypoxic Respiratory Failure in the setting of Acute Bilateral PE -Supplemental O2 for Respiratory Stability goal >92% -High risk for intubation -Follow intermittent Chest X-ray & ABG as needed   #Leukocytosis likely reactive in the setting of PE No obvious source of infection, chest x-ray and UA negative -F/u cultures, trend lactic/ PCT -Monitor WBC/ fever curve -Hold antibiotics for now pending cultures -Pressors for MAP goal >65 -Strict I/O's   #Elevated Troponin likely demand in the setting of PE as above #Hypertension #HLD  -trend troponin until peaked -Heparin gtt for now -obtain Coox and start Milrinone low dose  -Hold home meds for now -Obtain 2D echo   #Abnormal Uterine Bleeding in the setting of Uterine Fibroids Long hx of AUB in the setting of uterine Fibroids -Failed IUD and was started on Depo Provera  for bleeding control -Hold provera  -trend H&H q6  -transfuse for hgb <8 -Discussed with on call OBGYN here who agrees with proceeding with thrombolytics for PE treatment but in the event of bleeding complications, pt may need embolization per IR or worse case scenario hyterectomy for excessive or life treatening uterine  bleeding. Please consult OBGYN oncall at [facility] on arrival   #Hypothryoidism -Check TSH, Free T4 -Continue home synthroid  #Anxiety and Depression #PTSD #ADD -Hold Ritalin   Best practice:  Diet:  NPO Pain/Anxiety/Delirium protocol (if indicated): No VAP protocol (if indicated): Not indicated DVT prophylaxis: Systemic AC GI prophylaxis: N/A Glucose control:  SSI No Central venous access:  Yes, and it is still needed Arterial line:  Yes, and it is still needed Foley:  Yes, and it is still needed Mobility:  bed rest  PT consulted: N/A Last date of multidisciplinary goals of care discussion [updated patient and Nephew at bedside] Code Status:  full code Disposition: ICU   = Goals of Care = Code Status Order: FULL  Primary Emergency Contact: JAMES,JOYCE ELINORE, Home Phone: [phone]  Wishes to pursue full aggressive treatment and intervention options, including CPR and intubation, but goals of care will be addressed on going with patient and family if necessary  Critical care time: 47 minutes        Almarie Nose DNP, CCRN, FNP-C, AGACNP-BC Acute Care & Family Nurse Practitioner [facility] PCCM on call pager [phone]

## 2023-10-19 NOTE — ED Notes (Signed)
 Patient transported to X-ray

## 2023-10-19 NOTE — Consult Note (Addendum)
 PHARMACY CONSULT NOTE - ELECTROLYTES  Pharmacy Consult for Electrolyte Monitoring and Replacement   Recent Labs: Height: 5' 7 (170.2 cm) Weight: 109 kg (240 lb 4.8 oz) IBW/kg (Calculated) : 61.6 Estimated Creatinine Clearance: 97.3 mL/min (by C-G formula based on SCr of 0.9 mg/dL). Potassium (mmol/L)  Date Value  10/19/2023 3.4 (L)  05/19/2012 3.8   Magnesium (mg/dL)  Date Value  93/83/7976 1.8   Calcium (mg/dL)  Date Value  93/72/7974 9.2   Calcium, Total (mg/dL)  Date Value  98/73/7985 9.3   Albumin (g/dL)  Date Value  93/72/7974 3.3 (L)  05/19/2012 3.6   Sodium (mmol/L)  Date Value  10/19/2023 140  05/19/2012 143    Assessment  Anita Aguirre is a 48 y.o. female presenting with shortness of breath and chest pain. PMH significant for hypertension, hypothyroidism, depression, ADD, PTSD, mastitis, migraine, uterine fibroid with active bleeding. Pharmacy has been consulted to monitor and replace electrolytes while under CCM care.  Diet: NPO MIVF: N/A Pertinent medications: norepinephrine gtt  Goal of Therapy: Electrolytes WNL  Plan:  K 3.4: KCL 10mEq IV x 2 Check BMP, Mg, Phos with AM labs  Thank you for allowing pharmacy to be a part of this patient's care.  Twanisha Foulk A Lynnmarie Lovett, PharmD Clinical Pharmacist 10/19/2023 9:04 PM

## 2023-10-19 NOTE — H&P (Incomplete)
 NAME:  Anita Aguirre, MRN:  [id number], DOB:  1976/01/01, LOS: 0 ADMISSION DATE:  10/19/2023, CONSULTATION DATE: 10/19/2023 REFERRING MD: Willo Dunnings, CHIEF COMPLAINT: Shortness of breath   HPI  48 y.o female with significant PMH of HTN, hypothyroidism, anxiety and depression, ADD, PTSD, mastitis, migraine headache without complication, and uterine fibroid with active bleeding who presented to the ED with chief complaints of worsening shortness of breath and chest pain.  Patient report onset of symptoms for almost a week, which has progressively worsened today with associated chest pain and difficulty ambulating.  She has history of uterine fibroid with intermittent episode of heavy bleeding.  She was started on Depo-Provera  about 2 years ago which she has been on and off due to active bleeding.  Patient initially complained of left calf pain about 4 days ago which is resolved but remained short of breath with moderate intrauterine bleeding.   ED Course: Initial vital signs showed HR of 114 beats/minute, BP 125/85 mm Hg, the RR 35 breaths/minute, and the oxygen saturation 94% on 2 L and a temperature of 98.59F (37.1C). Pertinent Labs/Diagnostics Findings: Na+/ K+: 140/3.4.  Glucose: 109  WBC: 15.2 K/L without bands or neutrophil predominance  Hgb/Hct: 9.8/30.9  Lactic acid: 3.4 COVID PCR: Negative,  troponin: 393 CTA showed bilateral PE with severe clot burden and CT evidence of right heart strain Medication administered in the ED: Heparin bolus 4000 units with continuous infusion.  1 L LR bolus Disposition:.  PCCM consulted for ICU admission.   Past Medical History  ***  Significant Hospital Events   ***  Consults:  ***  Procedures:  ***  Significant Diagnostic Tests:  : Chest Xray>  : Noncontrast CT head>  : CTA Chest, abdomen and pelvis>  Interim History / Subjective:      Micro Data:  : SARS-CoV-2 PCR> negative : Influenza PCR> negative : Blood culture  x2> : Urine Culture> : MRSA PCR>>  : Strep pneumo urinary antigen> : Legionella urinary antigen>  Antimicrobials:  Vancomycin Cefepime Azithromycin Ceftriaxone Metronidazole   OBJECTIVE  Blood pressure 109/85, pulse (!) 113, temperature 98 F (36.7 C), temperature source Oral, resp. rate (!) 29, height 5' 7 (1.702 m), weight 109 kg, SpO2 99%.       No intake or output data in the 24 hours ending 10/19/23 2334 Filed Weights   10/19/23 1551  Weight: 109 kg     Physical Examination  GENERAL:  year-old critically ill patient lying in the bed  EYES: PEERLA. No scleral icterus. Extraocular muscles intact.  HEENT: Head atraumatic, normocephalic. Oropharynx and nasopharynx clear.  NECK:  No JVD, supple  LUNGS: Normal breath sounds bilaterally.  No use of accessory muscles of respiration.  CARDIOVASCULAR: S1, S2 normal. No murmurs, rubs, or gallops.  ABDOMEN: Soft, NTND EXTREMITIES: No swelling or erythema.  Capillary refill < 3 seconds in all extremities. Pulses palpable distally. NEUROLOGIC: The patient is . No focal neurological deficit appreciated. Cranial nerves are intact.  SKIN: No obvious rash, lesion, or ulcer. Warm to touch Labs/imaging that I {ACTIONS; HAVE/HAVE NOT:19434}personally reviewed  (right click and Reselect all SmartList Selections daily)  CT Angio Chest PE W/Cm &/Or Wo Cm Result Date: 10/19/2023 CLINICAL DATA:  Short of breath.  Concern for pulmonary embolism. EXAM: CT ANGIOGRAPHY CHEST WITH CONTRAST TECHNIQUE: Multidetector CT imaging of the chest was performed using the standard protocol during bolus administration of intravenous contrast. Multiplanar CT image reconstructions and MIPs were obtained to evaluate the vascular anatomy. RADIATION  DOSE REDUCTION: This exam was performed according to the departmental dose-optimization program which includes automated exposure control, adjustment of the mA and/or kV according to patient size and/or use of iterative  reconstruction technique. CONTRAST:  75mL OMNIPAQUE IOHEXOL 350 MG/ML SOLN COMPARISON:  None Available. FINDINGS: Cardiovascular: There are filling defects within the proximal LEFT lower lobe pulmonary arteries. Filling defects are occlusive and partially occlusive. filling defects within the lingular pulmonary artery. Within the RIGHT lung, filling defects in the proximal segmental branches of the RIGHT lobe pulmonary artery and RIGHT upper lobe pulmonary artery Overall clot burden is severe. CT evidence of RIGHT ventricular strain with RIGHT ventricle to LEFT ventricle ratio equal 5.0:3.4 equal 1.4 Mediastinum/Nodes: No axillary or supraclavicular adenopathy. No mediastinal or hilar adenopathy. No pericardial fluid. Esophagus normal. Moderate hiatal hernia. Lungs/Pleura: Hazy nodule in the RIGHT lower lobe measuring 16 mm may represent small infarction. No large pulmonary infarction. Upper Abdomen: Limited view of the liver, kidneys, pancreas are unremarkable. Normal adrenal glands. Musculoskeletal: No aggressive osseous lesion. Review of the MIP images confirms the above findings. IMPRESSION: 1. Bilateral pulmonary emboli with severe clot burden. 2. CT evidence of RIGHT ventricular strain. Positive for acute PE with CT evidence of right heart strain (RV/LV Ratio = 1.4) consistent with at least submassive (intermediate risk) PE. The presence of right heart strain has been associated with an increased risk of morbidity and mortality. Please refer to the Code PE Focused order set in EPIC. 3. Small pulmonary infarction in the RIGHT lower lobe. 4. Moderate hiatal hernia. Critical Value/emergent results were called by telephone at the time of interpretation on 10/19/2023 at 6:06 pm to provider [facility] , who verbally acknowledged these results. Electronically Signed   By: Jackquline Boxer M.D.   On: 10/19/2023 18:07   DG Chest 2 View Result Date: 10/19/2023 CLINICAL DATA:  sob EXAM: CHEST - 2 VIEW COMPARISON:   October 07, 2021 FINDINGS: Lower lung volumes with elevation of the right hemidiaphragm. No focal airspace consolidation, pleural effusion, or pneumothorax. No cardiomegaly.No acute fracture or destructive lesion. IMPRESSION: No acute cardiopulmonary abnormality. Electronically Signed   By: Rogelia Myers M.D.   On: 10/19/2023 16:11     Labs   CBC: Recent Labs  Lab 10/19/23 1559 10/19/23 1856  WBC 15.2*  --   HGB 9.8* 9.6*  HCT 30.9* 30.9*  MCV 82.2  --   PLT 466*  --     Basic Metabolic Panel: Recent Labs  Lab 10/19/23 1559  NA 140  K 3.4*  CL 109  CO2 21*  GLUCOSE 109*  BUN 9  CREATININE 0.90  CALCIUM 9.2   GFR: Estimated Creatinine Clearance: 97.3 mL/min (by C-G formula based on SCr of 0.9 mg/dL). Recent Labs  Lab 10/19/23 1559 10/19/23 1639 10/19/23 1825 10/19/23 2110  WBC 15.2*  --   --   --   LATICACIDVEN  --  3.4* 3.0* 3.9*    Liver Function Tests: Recent Labs  Lab 10/19/23 1559  AST 28  ALT 12  ALKPHOS 22*  BILITOT 0.4  PROT 7.3  ALBUMIN 3.3*   No results for input(s): LIPASE, AMYLASE in the last 168 hours. No results for input(s): AMMONIA in the last 168 hours.  ABG No results found for: PHART, PCO2ART, PO2ART, HCO3, TCO2, ACIDBASEDEF, O2SAT   Coagulation Profile: Recent Labs  Lab 10/19/23 1710  INR 1.0    Cardiac Enzymes: No results for input(s): CKTOTAL, CKMB, CKMBINDEX, TROPONINI in the last 168 hours.  HbA1C:  No results found for: HGBA1C  CBG: Recent Labs  Lab 10/19/23 2102  GLUCAP 177*    Review of Systems:   ***  Past Medical History  She,  has a past medical history of ADD (attention deficit disorder), Arthritis (04/24/2008), Diffuse cystic mastopathy (04/24/2010), Hypertension, Hypothyroidism, and Mastitis.   Surgical History    Past Surgical History:  Procedure Laterality Date  . BREAST MASS EXCISION Left 2012  . COLONOSCOPY  2005   [place] ? MD   . INCISE AND DRAIN ABCESS  2010   . MASTECTOMY, PARTIAL  2013     Social History   reports that she has quit smoking. Her smoking use included cigarettes. She has a 10 pack-year smoking history. She has never used smokeless tobacco. She reports that she does not drink alcohol and does not use drugs.   Family History   Her family history includes Diabetes in her paternal grandmother; Hypertension in her mother; Throat cancer (age of onset: 39) in her paternal aunt.   Allergies Allergies  Allergen Reactions  . Bupropion Itching and Hives    Other Reaction(s): bugs under skin     Home Medications  Prior to Admission medications   Medication Sig Start Date End Date Taking? Authorizing Provider  amLODipine (NORVASC) 5 MG tablet Take 5 mg by mouth daily.   Yes [provider]  Ferrous Gluconate (KP FERROUS GLUCONATE) 324 (37.5 Fe) MG TABS Take 1 tablet by mouth daily. 12/27/21  Yes [provider]  levothyroxine (SYNTHROID) 88 MCG tablet Take 88 mcg by mouth daily before breakfast. 09/26/23  Yes [provider]  losartan-hydrochlorothiazide (HYZAAR) 50-12.5 MG tablet Take 1 tablet by mouth daily. 10/17/23  Yes [provider]  medroxyPROGESTERone  (PROVERA ) 10 MG tablet Take 2 tablets (20 mg total) by mouth 3 (three) times daily for 7 days. Use this dose only for bleeding that does not respond to once daily dosing. 09/06/18 10/19/23 Yes Leonce Garnette BIRCH, MD  medroxyPROGESTERone  (PROVERA ) 5 MG tablet Take 5 mg by mouth 3 (three) times daily. For 5-10 days for heavy bleeding   Yes [provider]  methylphenidate (RITALIN) 10 MG tablet Take 20 mg by mouth 2 (two) times daily. 08/06/18  Yes [provider]  Multiple Vitamin (MULTIVITAMIN WITH MINERALS) TABS Take 1 tablet by mouth daily.   Yes [provider]  propranolol (INDERAL) 10 MG tablet Take 10 mg by mouth daily as needed. 05/16/23  Yes [provider]  SM VITAMIN D3 50 MCG (2000 UT) CAPS Take 2,000 Units  by mouth daily. 09/26/23  Yes [provider]  TRI-LO-ESTARYLLA 0.18/0.215/0.25 MG-25 MCG TABS Take 1 tablet by mouth daily. 09/24/23  Yes [provider]  ibuprofen  (ADVIL ) 800 MG tablet Take 1 tablet (800 mg total) by mouth every 8 (eight) hours as needed. Patient not taking: Reported on 10/19/2023 10/09/18   Leonce Garnette BIRCH, MD  levonorgestrel  (MIRENA ) 20 MCG/24HR IUD 1 Intra Uterine Device (1 each total) by Intrauterine route once for 1 dose. 09/26/18 09/26/18  Leonce Garnette BIRCH, MD      Active Hospital Problem list   See systems below  Assessment & Plan:      Best practice:  Diet:  {Ipzu:73067} Pain/Anxiety/Delirium protocol (if indicated): {Pain/Anxiety/Delirium:26941} VAP protocol (if indicated): {VAP:29640} DVT prophylaxis: {DVT Prophylaxis:26933} GI prophylaxis: {HP:73065} Glucose control:  {Glucose Control:26935} Central venous access:  {Central Venous Access:26936} Arterial line:  {Central Venous Access:26936} Foley:  {Central Venous Access:26936} Mobility:  {Mobility:26937}  PT consulted: {PT  Consult:26938} Last date of multidisciplinary goals of care discussion [***] Code Status:  {Code Status:26939} Disposition: ***   = Goals of Care = Code Status Order: @CODE @   Primary Emergency Contact: JAMES,JOYCE Q, Home Phone: [phone] Wishes to pursue full aggressive treatment and intervention options, including CPR and intubation, but goals of care will be addressed on going with family if that should become necessary. Patient wishes to pursue ongoing treatment with relatively conservative measures (e.g., IV fluids, antibiotics), but would not wish to escalate to ICU level of care or other invasive measures. Patient wishes to pursue ongoing treatment, but concurred that if deteriorated to pulselessness, patient would prefer a natural death as opposed to invasive measures such as CPR and intubation.  Family should be immediately contacted in such  situations.  Critical care time: 45 minutes        Almarie Nose DNP, CCRN, FNP-C, AGACNP-BC Acute Care & Family Nurse Practitioner [facility] PCCM on call pager [phone]

## 2023-10-20 ENCOUNTER — Inpatient Hospital Stay (HOSPITAL_COMMUNITY): Payer: Self-pay

## 2023-10-20 ENCOUNTER — Inpatient Hospital Stay (HOSPITAL_COMMUNITY)
Admission: EM | Admit: 2023-10-20 | Discharge: 2023-11-04 | DRG: 003 | Disposition: A | Discharge status: Home / Self Care | Payer: MEDICAID | Source: Other Acute Inpatient Hospital | Attending: Internal Medicine | Admitting: Internal Medicine

## 2023-10-20 ENCOUNTER — Inpatient Hospital Stay (HOSPITAL_COMMUNITY)
Admission: EM | Disposition: A | Discharge status: Home / Self Care | Payer: Self-pay | Source: Other Acute Inpatient Hospital | Attending: Internal Medicine

## 2023-10-20 DIAGNOSIS — J690 Pneumonitis due to inhalation of food and vomit: Secondary | ICD-10-CM | POA: Diagnosis present

## 2023-10-20 DIAGNOSIS — J9601 Acute respiratory failure with hypoxia: Secondary | ICD-10-CM | POA: Diagnosis present

## 2023-10-20 DIAGNOSIS — F17290 Nicotine dependence, other tobacco product, uncomplicated: Secondary | ICD-10-CM | POA: Diagnosis present

## 2023-10-20 DIAGNOSIS — N61 Mastitis without abscess: Secondary | ICD-10-CM | POA: Diagnosis present

## 2023-10-20 DIAGNOSIS — D649 Anemia, unspecified: Secondary | ICD-10-CM | POA: Insufficient documentation

## 2023-10-20 DIAGNOSIS — E872 Acidosis, unspecified: Secondary | ICD-10-CM | POA: Diagnosis present

## 2023-10-20 DIAGNOSIS — F419 Anxiety disorder, unspecified: Secondary | ICD-10-CM | POA: Diagnosis present

## 2023-10-20 DIAGNOSIS — I50810 Right heart failure, unspecified: Secondary | ICD-10-CM | POA: Diagnosis present

## 2023-10-20 DIAGNOSIS — Z6834 Body mass index (BMI) 34.0-34.9, adult: Secondary | ICD-10-CM

## 2023-10-20 DIAGNOSIS — E876 Hypokalemia: Secondary | ICD-10-CM | POA: Diagnosis present

## 2023-10-20 DIAGNOSIS — D696 Thrombocytopenia, unspecified: Secondary | ICD-10-CM | POA: Diagnosis present

## 2023-10-20 DIAGNOSIS — N939 Abnormal uterine and vaginal bleeding, unspecified: Secondary | ICD-10-CM

## 2023-10-20 DIAGNOSIS — Z5941 Food insecurity: Secondary | ICD-10-CM

## 2023-10-20 DIAGNOSIS — Z7982 Long term (current) use of aspirin: Secondary | ICD-10-CM

## 2023-10-20 DIAGNOSIS — I2609 Other pulmonary embolism with acute cor pulmonale: Secondary | ICD-10-CM

## 2023-10-20 DIAGNOSIS — E66812 Obesity, class 2: Secondary | ICD-10-CM | POA: Diagnosis present

## 2023-10-20 DIAGNOSIS — I82432 Acute embolism and thrombosis of left popliteal vein: Secondary | ICD-10-CM | POA: Diagnosis present

## 2023-10-20 DIAGNOSIS — I10 Essential (primary) hypertension: Secondary | ICD-10-CM | POA: Diagnosis present

## 2023-10-20 DIAGNOSIS — Z833 Family history of diabetes mellitus: Secondary | ICD-10-CM

## 2023-10-20 DIAGNOSIS — K449 Diaphragmatic hernia without obstruction or gangrene: Secondary | ICD-10-CM | POA: Diagnosis present

## 2023-10-20 DIAGNOSIS — Z5982 Transportation insecurity: Secondary | ICD-10-CM

## 2023-10-20 DIAGNOSIS — N179 Acute kidney failure, unspecified: Secondary | ICD-10-CM | POA: Diagnosis present

## 2023-10-20 DIAGNOSIS — D72829 Elevated white blood cell count, unspecified: Secondary | ICD-10-CM

## 2023-10-20 DIAGNOSIS — I82442 Acute embolism and thrombosis of left tibial vein: Secondary | ICD-10-CM | POA: Diagnosis present

## 2023-10-20 DIAGNOSIS — Z781 Physical restraint status: Secondary | ICD-10-CM

## 2023-10-20 DIAGNOSIS — D62 Acute posthemorrhagic anemia: Secondary | ICD-10-CM | POA: Diagnosis present

## 2023-10-20 DIAGNOSIS — I2729 Other secondary pulmonary hypertension: Secondary | ICD-10-CM | POA: Diagnosis present

## 2023-10-20 DIAGNOSIS — N938 Other specified abnormal uterine and vaginal bleeding: Secondary | ICD-10-CM

## 2023-10-20 DIAGNOSIS — Z90710 Acquired absence of both cervix and uterus: Secondary | ICD-10-CM

## 2023-10-20 DIAGNOSIS — I743 Embolism and thrombosis of arteries of the lower extremities: Secondary | ICD-10-CM | POA: Diagnosis present

## 2023-10-20 DIAGNOSIS — E039 Hypothyroidism, unspecified: Secondary | ICD-10-CM | POA: Diagnosis present

## 2023-10-20 DIAGNOSIS — R570 Cardiogenic shock: Secondary | ICD-10-CM | POA: Diagnosis present

## 2023-10-20 DIAGNOSIS — K5900 Constipation, unspecified: Secondary | ICD-10-CM | POA: Diagnosis present

## 2023-10-20 DIAGNOSIS — R7989 Other specified abnormal findings of blood chemistry: Secondary | ICD-10-CM

## 2023-10-20 DIAGNOSIS — I110 Hypertensive heart disease with heart failure: Secondary | ICD-10-CM | POA: Diagnosis present

## 2023-10-20 DIAGNOSIS — Z79890 Hormone replacement therapy (postmenopausal): Secondary | ICD-10-CM

## 2023-10-20 DIAGNOSIS — D259 Leiomyoma of uterus, unspecified: Secondary | ICD-10-CM | POA: Diagnosis present

## 2023-10-20 DIAGNOSIS — E870 Hyperosmolality and hypernatremia: Secondary | ICD-10-CM | POA: Diagnosis present

## 2023-10-20 DIAGNOSIS — Z7901 Long term (current) use of anticoagulants: Secondary | ICD-10-CM

## 2023-10-20 DIAGNOSIS — Z808 Family history of malignant neoplasm of other organs or systems: Secondary | ICD-10-CM

## 2023-10-20 DIAGNOSIS — Z8249 Family history of ischemic heart disease and other diseases of the circulatory system: Secondary | ICD-10-CM

## 2023-10-20 DIAGNOSIS — I2699 Other pulmonary embolism without acute cor pulmonale: Secondary | ICD-10-CM

## 2023-10-20 HISTORY — PX: ECMO CANNULATION: CATH118321

## 2023-10-20 HISTORY — PX: RIGHT HEART CATH: CATH118263

## 2023-10-20 HISTORY — PX: IR US GUIDE VASC ACCESS RIGHT: IMG2390

## 2023-10-20 HISTORY — PX: IR ANGIOGRAM PELVIS SELECTIVE OR SUPRASELECTIVE: IMG661

## 2023-10-20 HISTORY — PX: IR ANGIOGRAM PULMONARY BILATERAL SELECTIVE: IMG664

## 2023-10-20 HISTORY — PX: IR INFUSION THROMBOL ARTERIAL INITIAL (MS): IMG5376

## 2023-10-20 HISTORY — PX: ARTERIAL LINE INSERTION: CATH118227

## 2023-10-20 HISTORY — PX: IR ANGIOGRAM SELECTIVE EACH ADDITIONAL VESSEL: IMG667

## 2023-10-20 HISTORY — PX: IR EMBO TUMOR ORGAN ISCHEMIA INFARCT INC GUIDE ROADMAPPING: IMG5449

## 2023-10-20 LAB — POCT I-STAT, CHEM 8
BUN: 13 mg/dL (ref 6–20)
Calcium, Ion: 0.92 mmol/L — ABNORMAL LOW (ref 1.15–1.40)
Chloride: 102 mmol/L (ref 98–111)
Creatinine, Ser: 1.2 mg/dL — ABNORMAL HIGH (ref 0.44–1.00)
Glucose, Bld: 169 mg/dL — ABNORMAL HIGH (ref 70–99)
HCT: 24 % — ABNORMAL LOW (ref 36.0–46.0)
Hemoglobin: 8.2 g/dL — ABNORMAL LOW (ref 12.0–15.0)
Potassium: 3.7 mmol/L (ref 3.5–5.1)
Sodium: 139 mmol/L (ref 135–145)
TCO2: 22 mmol/L (ref 22–32)

## 2023-10-20 LAB — POCT I-STAT 7, (LYTES, BLD GAS, ICA,H+H)
Acid-base deficit: 6 mmol/L — ABNORMAL HIGH (ref 0.0–2.0)
Acid-base deficit: 2 mmol/L (ref 0.0–2.0)
Acid-base deficit: 6 mmol/L — ABNORMAL HIGH (ref 0.0–2.0)
Acid-base deficit: 6 mmol/L — ABNORMAL HIGH (ref 0.0–2.0)
Bicarbonate: 16.7 mmol/L — ABNORMAL LOW (ref 20.0–28.0)
Bicarbonate: 19 mmol/L — ABNORMAL LOW (ref 20.0–28.0)
Bicarbonate: 22.9 mmol/L (ref 20.0–28.0)
Bicarbonate: 18.2 mmol/L — ABNORMAL LOW (ref 20.0–28.0)
Calcium, Ion: 1.12 mmol/L — ABNORMAL LOW (ref 1.15–1.40)
Calcium, Ion: 0.91 mmol/L — ABNORMAL LOW (ref 1.15–1.40)
Calcium, Ion: 1.04 mmol/L — ABNORMAL LOW (ref 1.15–1.40)
Calcium, Ion: 1.1 mmol/L — ABNORMAL LOW (ref 1.15–1.40)
HCT: 26 % — ABNORMAL LOW (ref 36.0–46.0)
HCT: 21 % — ABNORMAL LOW (ref 36.0–46.0)
HCT: 23 % — ABNORMAL LOW (ref 36.0–46.0)
HCT: 25 % — ABNORMAL LOW (ref 36.0–46.0)
Hemoglobin: 8.8 g/dL — ABNORMAL LOW (ref 12.0–15.0)
Hemoglobin: 7.1 g/dL — ABNORMAL LOW (ref 12.0–15.0)
Hemoglobin: 7.8 g/dL — ABNORMAL LOW (ref 12.0–15.0)
Hemoglobin: 8.5 g/dL — ABNORMAL LOW (ref 12.0–15.0)
O2 Saturation: 95 %
O2 Saturation: 98 %
O2 Saturation: 99 %
O2 Saturation: 99 %
Patient temperature: 38
Patient temperature: 36.5
Patient temperature: 36
Potassium: 3.4 mmol/L — ABNORMAL LOW (ref 3.5–5.1)
Potassium: 3.6 mmol/L (ref 3.5–5.1)
Potassium: 3.7 mmol/L (ref 3.5–5.1)
Potassium: 3.3 mmol/L — ABNORMAL LOW (ref 3.5–5.1)
Sodium: 136 mmol/L (ref 135–145)
Sodium: 136 mmol/L (ref 135–145)
Sodium: 139 mmol/L (ref 135–145)
Sodium: 140 mmol/L (ref 135–145)
TCO2: 17 mmol/L — ABNORMAL LOW (ref 22–32)
TCO2: 20 mmol/L — ABNORMAL LOW (ref 22–32)
TCO2: 24 mmol/L (ref 22–32)
TCO2: 19 mmol/L — ABNORMAL LOW (ref 22–32)
pCO2 arterial: 23.9 mmHg — ABNORMAL LOW (ref 32–48)
pCO2 arterial: 35.5 mmHg (ref 32–48)
pCO2 arterial: 40.2 mmHg (ref 32–48)
pCO2 arterial: 30.3 mmHg — ABNORMAL LOW (ref 32–48)
pH, Arterial: 7.456 — ABNORMAL HIGH (ref 7.35–7.45)
pH, Arterial: 7.335 — ABNORMAL LOW (ref 7.35–7.45)
pH, Arterial: 7.365 (ref 7.35–7.45)
pH, Arterial: 7.383 (ref 7.35–7.45)
pO2, Arterial: 74 mmHg — ABNORMAL LOW (ref 83–108)
pO2, Arterial: 105 mmHg (ref 83–108)
pO2, Arterial: 122 mmHg — ABNORMAL HIGH (ref 83–108)
pO2, Arterial: 157 mmHg — ABNORMAL HIGH (ref 83–108)

## 2023-10-20 LAB — LACTIC ACID, PLASMA
Lactic Acid, Venous: 1.8 mmol/L (ref 0.5–1.9)
Lactic Acid, Venous: 2.7 mmol/L (ref 0.5–1.9)

## 2023-10-20 LAB — TYPE AND SCREEN
ABO/RH(D): O POS
Antibody Screen: NEGATIVE

## 2023-10-20 LAB — CBC
HCT: 27.3 % — ABNORMAL LOW (ref 36.0–46.0)
Hemoglobin: 8.4 g/dL — ABNORMAL LOW (ref 12.0–15.0)
MCH: 26.3 pg (ref 26.0–34.0)
MCHC: 30.8 g/dL (ref 30.0–36.0)
MCV: 85.6 fL (ref 80.0–100.0)
Platelets: 426 10*3/uL — ABNORMAL HIGH (ref 150–400)
RBC: 3.19 MIL/uL — ABNORMAL LOW (ref 3.87–5.11)
RDW: 18 % — ABNORMAL HIGH (ref 11.5–15.5)
WBC: 18 10*3/uL — ABNORMAL HIGH (ref 4.0–10.5)
nRBC: 0 % (ref 0.0–0.2)

## 2023-10-20 LAB — RENAL FUNCTION PANEL
Albumin: 3.1 g/dL — ABNORMAL LOW (ref 3.5–5.0)
Anion gap: 16 — ABNORMAL HIGH (ref 5–15)
BUN: 14 mg/dL (ref 6–20)
CO2: 18 mmol/L — ABNORMAL LOW (ref 22–32)
Calcium: 8.5 mg/dL — ABNORMAL LOW (ref 8.9–10.3)
Chloride: 101 mmol/L (ref 98–111)
Creatinine, Ser: 1.22 mg/dL — ABNORMAL HIGH (ref 0.44–1.00)
GFR, Estimated: 55 mL/min — ABNORMAL LOW (ref >60)
Glucose, Bld: 136 mg/dL — ABNORMAL HIGH (ref 70–99)
Phosphorus: 3.9 mg/dL (ref 2.5–4.6)
Potassium: 3.4 mmol/L — ABNORMAL LOW (ref 3.5–5.1)
Sodium: 135 mmol/L (ref 135–145)

## 2023-10-20 LAB — COOXEMETRY PANEL
Carboxyhemoglobin: 1.3 % (ref 0.5–1.5)
Carboxyhemoglobin: 1.7 % — ABNORMAL HIGH (ref 0.5–1.5)
Methemoglobin: <0.7 % (ref 0.0–1.5)
Methemoglobin: <0.7 % (ref 0.0–1.5)
O2 Saturation: 51.3 %
O2 Saturation: 47.9 %
Total hemoglobin: 8.5 g/dL — ABNORMAL LOW (ref 12.0–16.0)
Total hemoglobin: 8.8 g/dL — ABNORMAL LOW (ref 12.0–16.0)

## 2023-10-20 LAB — TROPONIN I (HIGH SENSITIVITY): Troponin I (High Sensitivity): 186 ng/L (ref <18)

## 2023-10-20 LAB — HEMOGLOBIN AND HEMATOCRIT, BLOOD
HCT: 27.2 % — ABNORMAL LOW (ref 36.0–46.0)
Hemoglobin: 8.4 g/dL — ABNORMAL LOW (ref 12.0–15.0)

## 2023-10-20 LAB — BRAIN NATRIURETIC PEPTIDE: B Natriuretic Peptide: 308.4 pg/mL — ABNORMAL HIGH (ref 0.0–100.0)

## 2023-10-20 LAB — BASIC METABOLIC PANEL WITH GFR
Anion gap: 12 (ref 5–15)
BUN: 14 mg/dL (ref 6–20)
CO2: 20 mmol/L — ABNORMAL LOW (ref 22–32)
Calcium: 8.6 mg/dL — ABNORMAL LOW (ref 8.9–10.3)
Chloride: 106 mmol/L (ref 98–111)
Creatinine, Ser: 1.09 mg/dL — ABNORMAL HIGH (ref 0.44–1.00)
GFR, Estimated: >60 mL/min (ref >60)
Glucose, Bld: 140 mg/dL — ABNORMAL HIGH (ref 70–99)
Potassium: 3.3 mmol/L — ABNORMAL LOW (ref 3.5–5.1)
Sodium: 138 mmol/L (ref 135–145)

## 2023-10-20 LAB — CARBOXYHEMOGLOBIN - COOX: Carboxyhemoglobin: 1.7 % — ABNORMAL HIGH (ref 0.5–1.5)

## 2023-10-20 LAB — ECHOCARDIOGRAM COMPLETE
S' Lateral: 2.9 cm
Weight: 3746.06 [oz_av]

## 2023-10-20 LAB — MRSA NEXT GEN BY PCR, NASAL
MRSA by PCR Next Gen: NOT DETECTED
MRSA by PCR Next Gen: NOT DETECTED
MRSA by PCR Next Gen: NOT DETECTED

## 2023-10-20 LAB — HIV ANTIBODY (ROUTINE TESTING W REFLEX): HIV Screen 4th Generation wRfx: NONREACTIVE

## 2023-10-20 LAB — HEMOGLOBIN A1C
Hgb A1c MFr Bld: 5 % (ref 4.8–5.6)
Mean Plasma Glucose: 96.8 mg/dL

## 2023-10-20 LAB — GLUCOSE, CAPILLARY
Glucose-Capillary: 139 mg/dL — ABNORMAL HIGH (ref 70–99)
Glucose-Capillary: 147 mg/dL — ABNORMAL HIGH (ref 70–99)
Glucose-Capillary: 125 mg/dL — ABNORMAL HIGH (ref 70–99)
Glucose-Capillary: 130 mg/dL — ABNORMAL HIGH (ref 70–99)

## 2023-10-20 LAB — MAGNESIUM: Magnesium: 1.9 mg/dL (ref 1.7–2.4)

## 2023-10-20 LAB — PROCALCITONIN: Procalcitonin: <0.1 ng/mL

## 2023-10-20 LAB — CG4 I-STAT (LACTIC ACID)
Lactic Acid, Venous: 2.2 mmol/L (ref 0.5–1.9)
Lactic Acid, Venous: 1.9 mmol/L (ref 0.5–1.9)

## 2023-10-20 LAB — POCT ACTIVATED CLOTTING TIME
Activated Clotting Time: 129 s
Activated Clotting Time: 170 s

## 2023-10-20 LAB — HEPARIN LEVEL (UNFRACTIONATED)
Heparin Unfractionated: 0.12 [IU]/mL — ABNORMAL LOW (ref 0.30–0.70)
Heparin Unfractionated: 0.15 [IU]/mL — ABNORMAL LOW (ref 0.30–0.70)
Heparin Unfractionated: 0.29 [IU]/mL — ABNORMAL LOW (ref 0.30–0.70)

## 2023-10-20 LAB — PHOSPHORUS: Phosphorus: 4.9 mg/dL — ABNORMAL HIGH (ref 2.5–4.6)

## 2023-10-20 LAB — PREPARE RBC (CROSSMATCH)

## 2023-10-20 SURGERY — ECMO CANNULATION
Anesthesia: LOCAL

## 2023-10-20 MED ORDER — IOHEXOL 350 MG/ML SOLN
75.0000 mL | Freq: Once | INTRAVENOUS | Status: AC | PRN
  Administered 2023-10-20: 75 mL via INTRAVENOUS

## 2023-10-20 MED ORDER — SODIUM CHLORIDE 0.9 % IV SOLN: INTRAVENOUS | Status: AC

## 2023-10-20 MED ORDER — MIDAZOLAM-SODIUM CHLORIDE 100-0.9 MG/100ML-% IV SOLN
0.0000 mg/h | INTRAVENOUS | Status: DC
  Administered 2023-10-20: 2 mg/h via INTRAVENOUS
  Administered 2023-10-21 – 2023-10-24 (×10): 10 mg/h via INTRAVENOUS
  Administered 2023-10-25: 4 mg/h via INTRAVENOUS
  Administered 2023-10-25: 10 mg/h via INTRAVENOUS
  Filled 2023-10-20 (×10): qty 100
  Filled 2023-10-20: qty 200
  Filled 2023-10-20 (×2): qty 100

## 2023-10-20 MED ORDER — POTASSIUM CHLORIDE CRYS ER 20 MEQ PO TBCR
40.0000 meq | EXTENDED_RELEASE_TABLET | ORAL | Status: AC
  Administered 2023-10-20 (×2): 40 meq via ORAL
  Filled 2023-10-20 (×2): qty 2

## 2023-10-20 MED ORDER — VANCOMYCIN VARIABLE DOSE PER UNSTABLE RENAL FUNCTION (PHARMACIST DOSING): Status: DC

## 2023-10-20 MED ORDER — ALBUMIN HUMAN 5 % IV SOLN
INTRAVENOUS | Status: AC
  Filled 2023-10-20: qty 250

## 2023-10-20 MED ORDER — IOHEXOL 300 MG/ML  SOLN: 150.0000 mL | Freq: Once | INTRAMUSCULAR | Status: DC | PRN

## 2023-10-20 MED ORDER — ROCURONIUM BROMIDE 10 MG/ML (PF) SYRINGE
PREFILLED_SYRINGE | INTRAVENOUS | Status: AC
  Filled 2023-10-20: qty 10

## 2023-10-20 MED ORDER — MIDAZOLAM HCL 2 MG/2ML IJ SOLN
INTRAMUSCULAR | Status: AC
  Filled 2023-10-20: qty 2

## 2023-10-20 MED ORDER — NOREPINEPHRINE 16 MG/250ML-% IV SOLN
0.0000 ug/min | INTRAVENOUS | Status: DC
  Administered 2023-10-20: 6 ug/min via INTRAVENOUS
  Filled 2023-10-20 (×2): qty 250

## 2023-10-20 MED ORDER — SODIUM BICARBONATE 8.4 % IV SOLN
INTRAVENOUS | Status: AC | PRN
  Administered 2023-10-20: 100 meq via INTRAVENOUS

## 2023-10-20 MED ORDER — FENTANYL 2500MCG IN NS 250ML (10MCG/ML) PREMIX INFUSION
0.0000 ug/h | INTRAVENOUS | Status: DC
  Administered 2023-10-20: 3 ug/h via INTRAVENOUS
  Administered 2023-10-21: 200 ug/h via INTRAVENOUS
  Filled 2023-10-20 (×3): qty 250

## 2023-10-20 MED ORDER — LIDOCAINE HCL (PF) 1 % IJ SOLN
INTRAMUSCULAR | Status: AC | PRN
  Administered 2023-10-20: 15 mL via INTRADERMAL

## 2023-10-20 MED ORDER — MIDAZOLAM HCL 2 MG/2ML IJ SOLN
INTRAMUSCULAR | Status: AC
Start: 2023-10-20 — End: 2023-10-20
  Filled 2023-10-20: qty 2

## 2023-10-20 MED ORDER — INSULIN ASPART 100 UNIT/ML IJ SOLN
0.0000 [IU] | INTRAMUSCULAR | Status: DC
  Administered 2023-10-20 – 2023-10-22 (×3): 3 [IU] via SUBCUTANEOUS
  Administered 2023-10-22 – 2023-10-23 (×2): 4 [IU] via SUBCUTANEOUS
  Administered 2023-10-23: 3 [IU] via SUBCUTANEOUS
  Administered 2023-10-23: 4 [IU] via SUBCUTANEOUS
  Administered 2023-10-23 – 2023-10-27 (×14): 3 [IU] via SUBCUTANEOUS
  Administered 2023-10-27 (×2): 4 [IU] via SUBCUTANEOUS
  Administered 2023-10-27 – 2023-10-28 (×3): 3 [IU] via SUBCUTANEOUS

## 2023-10-20 MED ORDER — HEPARIN BOLUS VIA INFUSION
2600.0000 [IU] | Freq: Once | INTRAVENOUS | Status: AC
  Administered 2023-10-20: 2600 [IU] via INTRAVENOUS
  Filled 2023-10-20: qty 2600

## 2023-10-20 MED ORDER — FUROSEMIDE 10 MG/ML IJ SOLN
80.0000 mg | Freq: Once | INTRAMUSCULAR | Status: AC
  Administered 2023-10-20: 80 mg via INTRAVENOUS
  Filled 2023-10-20: qty 8

## 2023-10-20 MED ORDER — LIDOCAINE HCL 1 % IJ SOLN
INTRAMUSCULAR | Status: AC
  Filled 2023-10-20: qty 20

## 2023-10-20 MED ORDER — LIDOCAINE HCL 1 % IJ SOLN
INTRAMUSCULAR | Status: AC
Start: 2023-10-20 — End: 2023-10-20
  Filled 2023-10-20: qty 40

## 2023-10-20 MED ORDER — FENTANYL BOLUS VIA INFUSION
25.0000 ug | INTRAVENOUS | Status: DC | PRN
  Administered 2023-10-21: 50 ug via INTRAVENOUS
  Administered 2023-10-21 (×2): 100 ug via INTRAVENOUS

## 2023-10-20 MED ORDER — SODIUM CHLORIDE 0.9 % IV SOLN
12.0000 mg | Freq: Once | INTRAVENOUS | Status: AC
  Administered 2023-10-20: 12 mg via INTRAVENOUS
  Filled 2023-10-20: qty 12

## 2023-10-20 MED ORDER — DOCUSATE SODIUM 100 MG PO CAPS: 100.0000 mg | ORAL_CAPSULE | Freq: Two times a day (BID) | ORAL | Status: DC | PRN

## 2023-10-20 MED ORDER — MIDAZOLAM BOLUS VIA INFUSION
0.0000 mg | INTRAVENOUS | Status: DC | PRN
  Administered 2023-10-21 (×2): 2 mg via INTRAVENOUS
  Administered 2023-10-22: 1 mg via INTRAVENOUS
  Administered 2023-10-23 – 2023-10-24 (×3): 2 mg via INTRAVENOUS

## 2023-10-20 MED ORDER — CHLORHEXIDINE GLUCONATE CLOTH 2 % EX PADS
6.0000 | MEDICATED_PAD | Freq: Every day | CUTANEOUS | Status: DC
  Administered 2023-10-21 – 2023-11-02 (×12): 6 via TOPICAL

## 2023-10-20 MED ORDER — VANCOMYCIN HCL 2000 MG/400ML IV SOLN
2000.0000 mg | Freq: Once | INTRAVENOUS | Status: DC
  Filled 2023-10-20: qty 400

## 2023-10-20 MED ORDER — NOREPINEPHRINE 4 MG/250ML-% IV SOLN
0.0000 ug/min | INTRAVENOUS | Status: DC
  Administered 2023-10-20 (×2): 4 ug/min via INTRAVENOUS
  Filled 2023-10-20: qty 250

## 2023-10-20 MED ORDER — SUCCINYLCHOLINE CHLORIDE 200 MG/10ML IV SOSY
PREFILLED_SYRINGE | INTRAVENOUS | Status: AC
  Filled 2023-10-20: qty 10

## 2023-10-20 MED ORDER — SODIUM CHLORIDE 0.9% FLUSH
3.0000 mL | Freq: Two times a day (BID) | INTRAVENOUS | Status: DC
  Administered 2023-10-21 – 2023-10-25 (×11): 3 mL via INTRAVENOUS

## 2023-10-20 MED ORDER — FUROSEMIDE 10 MG/ML IJ SOLN
60.0000 mg | Freq: Once | INTRAMUSCULAR | Status: AC
  Administered 2023-10-20: 60 mg via INTRAVENOUS
  Filled 2023-10-20: qty 6

## 2023-10-20 MED ORDER — MORPHINE SULFATE (PF) 2 MG/ML IV SOLN
1.0000 mg | Freq: Once | INTRAVENOUS | Status: AC
  Administered 2023-10-20: 1 mg via INTRAVENOUS
  Filled 2023-10-20: qty 1

## 2023-10-20 MED ORDER — KETAMINE HCL 50 MG/5ML IJ SOSY
PREFILLED_SYRINGE | INTRAMUSCULAR | Status: AC | PRN
  Administered 2023-10-20: 50 mg via INTRAVENOUS

## 2023-10-20 MED ORDER — HYDROCORTISONE SOD SUC (PF) 100 MG IJ SOLR
INTRAMUSCULAR | Status: DC
Start: 2023-10-20 — End: 2023-10-20
  Filled 2023-10-20: qty 2

## 2023-10-20 MED ORDER — FENTANYL CITRATE (PF) 100 MCG/2ML IJ SOLN: 25.0000 ug | Freq: Once | INTRAMUSCULAR | Status: DC

## 2023-10-20 MED ORDER — ROCURONIUM BROMIDE 10 MG/ML (PF) SYRINGE
PREFILLED_SYRINGE | INTRAVENOUS | Status: AC
  Filled 2023-10-20: qty 20

## 2023-10-20 MED ORDER — SODIUM BICARBONATE 8.4 % IV SOLN
INTRAVENOUS | Status: AC
  Filled 2023-10-20: qty 50

## 2023-10-20 MED ORDER — SODIUM CHLORIDE 0.9 % IV SOLN
INTRAVENOUS | Status: AC | PRN
  Administered 2023-10-20: 250 mL via INTRAVENOUS

## 2023-10-20 MED ORDER — DOCUSATE SODIUM 50 MG/5ML PO LIQD
100.0000 mg | Freq: Two times a day (BID) | ORAL | Status: DC
  Administered 2023-10-21 – 2023-10-22 (×4): 100 mg
  Filled 2023-10-20 (×4): qty 10

## 2023-10-20 MED ORDER — METHYLENE BLUE (ANTIDOTE) 1 % IV SOLN
2.0000 mg/kg | Freq: Once | INTRAVENOUS | Status: AC
  Administered 2023-10-20: 212 mg via INTRAVENOUS
  Filled 2023-10-20: qty 21.2

## 2023-10-20 MED ORDER — ONDANSETRON HCL 4 MG/2ML IJ SOLN: 4.0000 mg | Freq: Four times a day (QID) | INTRAMUSCULAR | Status: DC | PRN

## 2023-10-20 MED ORDER — MILRINONE LACTATE IN DEXTROSE 20-5 MG/100ML-% IV SOLN
0.3750 ug/kg/min | INTRAVENOUS | Status: DC
  Administered 2023-10-20: 0.375 ug/kg/min via INTRAVENOUS
  Administered 2023-10-20 (×2): 0.25 ug/kg/min via INTRAVENOUS
  Filled 2023-10-20 (×2): qty 100

## 2023-10-20 MED ORDER — ALBUMIN HUMAN 25 % IV SOLN
INTRAVENOUS | Status: AC | PRN
  Administered 2023-10-20 (×2): 12.5 g via INTRAVENOUS

## 2023-10-20 MED ORDER — SODIUM CHLORIDE 0.9 % IV SOLN
1.0000 g | Freq: Three times a day (TID) | INTRAVENOUS | Status: DC
  Filled 2023-10-20: qty 20

## 2023-10-20 MED ORDER — VASOPRESSIN 20 UNIT/ML IV SOLN
INTRAVENOUS | Status: AC | PRN
  Administered 2023-10-20: .03 [IU]

## 2023-10-20 MED ORDER — HEPARIN SODIUM (PORCINE) 1000 UNIT/ML IJ SOLN
INTRAMUSCULAR | Status: AC | PRN
  Administered 2023-10-20: 7000 [IU] via INTRAVENOUS

## 2023-10-20 MED ORDER — CALCIUM CHLORIDE 10 % IV SOLN
INTRAVENOUS | Status: AC | PRN
  Administered 2023-10-20: 1 g via INTRAVENOUS

## 2023-10-20 MED ORDER — ETOMIDATE 2 MG/ML IV SOLN
INTRAVENOUS | Status: AC
  Filled 2023-10-20: qty 20

## 2023-10-20 MED ORDER — SODIUM CHLORIDE 0.9% FLUSH: 3.0000 mL | INTRAVENOUS | Status: DC | PRN

## 2023-10-20 MED ORDER — HEPARIN BOLUS VIA INFUSION
2000.0000 [IU] | Freq: Once | INTRAVENOUS | Status: AC
  Administered 2023-10-20: 2000 [IU] via INTRAVENOUS
  Filled 2023-10-20: qty 2000

## 2023-10-20 MED ORDER — FENTANYL CITRATE (PF) 100 MCG/2ML IJ SOLN
INTRAMUSCULAR | Status: AC
  Filled 2023-10-20: qty 2

## 2023-10-20 MED ORDER — HYDROCORTISONE SOD SUC (PF) 100 MG IJ SOLR
100.0000 mg | Freq: Once | INTRAMUSCULAR | Status: AC
  Administered 2023-10-21: 100 mg via INTRAVENOUS
  Filled 2023-10-20: qty 2

## 2023-10-20 MED ORDER — POLYETHYLENE GLYCOL 3350 17 G PO PACK
17.0000 g | PACK | Freq: Every day | ORAL | Status: DC
  Administered 2023-10-21 – 2023-10-22 (×2): 17 g
  Filled 2023-10-20 (×2): qty 1

## 2023-10-20 MED ORDER — KETAMINE HCL 50 MG/5ML IJ SOSY
PREFILLED_SYRINGE | INTRAMUSCULAR | Status: AC
  Filled 2023-10-20: qty 10

## 2023-10-20 MED ORDER — VANCOMYCIN HCL 2000 MG/400ML IV SOLN
2000.0000 mg | Freq: Once | INTRAVENOUS | Status: AC
  Administered 2023-10-21: 2000 mg via INTRAVENOUS

## 2023-10-20 MED ORDER — MIDAZOLAM HCL 2 MG/2ML IJ SOLN
INTRAMUSCULAR | Status: AC | PRN
  Administered 2023-10-20 (×2): 2 mg via INTRAVENOUS
  Administered 2023-10-20: 1 mg via INTRAVENOUS

## 2023-10-20 MED ORDER — FENTANYL CITRATE PF 50 MCG/ML IJ SOSY
PREFILLED_SYRINGE | INTRAMUSCULAR | Status: AC
  Filled 2023-10-20: qty 2

## 2023-10-20 MED ORDER — DOCUSATE SODIUM 50 MG/5ML PO LIQD: 100.0000 mg | Freq: Two times a day (BID) | ORAL | Status: DC

## 2023-10-20 MED ORDER — SODIUM CHLORIDE 0.9 % IV SOLN
250.0000 mL | INTRAVENOUS | Status: AC | PRN
Start: 2023-10-20 — End: 2023-10-21

## 2023-10-20 MED ORDER — HEPARIN (PORCINE) 25000 UT/250ML-% IV SOLN
1600.0000 [IU]/h | INTRAVENOUS | Status: DC
  Administered 2023-10-20 (×2): 1350 [IU]/h via INTRAVENOUS
  Administered 2023-10-21: 1600 [IU]/h via INTRAVENOUS
  Filled 2023-10-20: qty 250

## 2023-10-20 MED ORDER — ROCURONIUM BROMIDE 10 MG/ML (PF) SYRINGE
PREFILLED_SYRINGE | INTRAVENOUS | Status: AC | PRN
  Administered 2023-10-20: 100 mg via INTRAVENOUS

## 2023-10-20 MED ORDER — ORAL CARE MOUTH RINSE: 15.0000 mL | OROMUCOSAL | Status: DC | PRN

## 2023-10-20 MED ORDER — LORAZEPAM 2 MG/ML IJ SOLN
INTRAMUSCULAR | Status: AC
  Administered 2023-10-20: 0.5 mg via INTRAVENOUS
  Filled 2023-10-20: qty 1

## 2023-10-20 MED ORDER — FENTANYL CITRATE PF 50 MCG/ML IJ SOSY: 25.0000 ug | PREFILLED_SYRINGE | Freq: Once | INTRAMUSCULAR | Status: DC

## 2023-10-20 MED ORDER — FENTANYL CITRATE (PF) 100 MCG/2ML IJ SOLN
INTRAMUSCULAR | Status: AC | PRN
  Administered 2023-10-20: 100 ug via INTRAVENOUS
  Administered 2023-10-20: 25 ug via INTRAVENOUS
  Administered 2023-10-20 (×2): 50 ug via INTRAVENOUS

## 2023-10-20 MED ORDER — MORPHINE SULFATE (PF) 2 MG/ML IV SOLN
2.0000 mg | Freq: Once | INTRAVENOUS | Status: AC
  Administered 2023-10-20: 2 mg via INTRAVENOUS
  Filled 2023-10-20: qty 1

## 2023-10-20 MED ORDER — SODIUM CHLORIDE 0.9% IV SOLUTION: Freq: Once | INTRAVENOUS | Status: DC

## 2023-10-20 MED ORDER — VASOPRESSIN 20 UNITS/100 ML INFUSION FOR SHOCK
0.0000 [IU]/min | INTRAVENOUS | Status: DC
  Administered 2023-10-20: 0.03 [IU]/min via INTRAVENOUS
  Filled 2023-10-20: qty 100

## 2023-10-20 MED ORDER — POLYETHYLENE GLYCOL 3350 17 G PO PACK: 17.0000 g | PACK | Freq: Every day | ORAL | Status: DC | PRN

## 2023-10-20 MED ORDER — LORAZEPAM 2 MG/ML IJ SOLN
1.0000 mg | Freq: Once | INTRAMUSCULAR | Status: AC
  Administered 2023-10-20: 0.5 mg via INTRAVENOUS

## 2023-10-20 MED ORDER — LIDOCAINE HCL 1 % IJ SOLN
INTRAMUSCULAR | Status: AC
  Filled 2023-10-20: qty 40

## 2023-10-20 MED ORDER — SODIUM CHLORIDE 0.9 % IV SOLN
1.0000 g | Freq: Three times a day (TID) | INTRAVENOUS | Status: DC
  Administered 2023-10-21 – 2023-10-24 (×11): 1 g via INTRAVENOUS
  Filled 2023-10-20 (×10): qty 20

## 2023-10-20 MED ORDER — HYDROCORTISONE SOD SUC (PF) 100 MG IJ SOLR
100.0000 mg | Freq: Once | INTRAVENOUS | Status: DC
  Filled 2023-10-20: qty 2

## 2023-10-20 SURGICAL SUPPLY — 12 items
CANNULA ADULT BIO-MEDICUS 19FR (CANNULA) IMPLANT
CATH SWAN GANZ 7F STRAIGHT (CATHETERS) IMPLANT
ELECT DEFIB PAD ADLT CADENCE (PAD) IMPLANT
KIT MICROPUNCTURE NIT STIFF (SHEATH) IMPLANT
SET TUBING HLS ADVANCED 7.0 (TUBING) IMPLANT
SHEATH PINNACLE 6F 10CM (SHEATH) IMPLANT
SHEATH PINNACLE 7F 10CM (SHEATH) IMPLANT
SHEATH SUPER FLEX 6FR 11 (SHEATH) IMPLANT
TUBING ART PRESS 72 MALE/FEM (TUBING) IMPLANT
WIRE AMPLATZ SS-J .035X180CM (WIRE) IMPLANT
WIRE HI TORQ VERSACORE-J 145CM (WIRE) IMPLANT
WIRE MICROINTRODUCER 60CM (WIRE) IMPLANT

## 2023-10-20 NOTE — Consult Note (Signed)
   ADVANCED HEART FAILURE CONSULT NOTE  Referring Physician: Isadora Hose, MD  Primary Care: Pcp, No Heart Failure: Sneha Willig, DO  CC: SCAI D Cardiogenic shock 2/2 RVF (Acute pulmonary embolism)  HPI: Anita Aguirre is a 48 y.o. female with hypertension, hypothyroidism, mastitis, bleeding uterine fibroids on OCPs that presented [facility] yesterday with a 2-day history of shortness of breath and chest pain.  Workup in the emergency department notable for pulmonary emboli with severe clot burden in the segmental and subsegmental branches bilaterally.  Overnight due to concern for RV failure patient was transferred to [facility].  This morning while on milrinone 0.25 mcg/kg/min, patient started to become increasingly hypoxic, hypotensive with rising lactic acid and decreasing mixed venous.  Stat echocardiogram was obtained, notable for acute RV failure.  After discussion with critical care and interventional radiology decision made to cannulate for VA ECMO followed by catheter directed thrombolytics with possible uterine artery embolization.   PHYSICAL EXAM: Vitals:   10/20/23 1615 10/20/23 1630  BP: 124/73 109/87  Pulse: (!) 111 (!) 105  Resp: 19 (!) 30  Temp: (!) 100.4 F (38 C) (!) 100.4 F (38 C)  SpO2: 95% 94%   Lungs- decreased BS CARDIAC:  JVP: >10 cm          Normal rate with regular rhythm. no murmur.  Pulses 1+. 1-2+ edema.  ABDOMEN: soft EXTREMITIES: cool  DATA REVIEW  ECG: 10/20/23: sinus tachycardia as per my personal interpretation  ECHO: 10/20/23: Normal LV function; severely reduced RV function.   ASSESSMENT & PLAN:  SCAI D SHOCK 2/2 PE & RV Failure - CT chest with on 10/19/23 with bilateral pulmonary emboli with severe clot burden; clot is in the segmental and distal branches, not amenable to thrombectomy.  - Rising pressor requirement today with mixed venous in the 40s; lactic acid increasing.  - Will plan to cannulate for V-A ECMO followed by  catheter directed thrombolytics and uterine artery embolization in IR.  - Plan discussed with CCM, IR and ECMO team.   Ria Commander Advanced Heart Failure Mechanical Circulatory Support   CRITICAL CARE Performed by: Ria Commander   Total critical care time: 35 minutes  Critical care time was exclusive of separately billable procedures and treating other patients.  Critical care was necessary to treat or prevent imminent or life-threatening deterioration.  Critical care was time spent personally by me on the following activities: development of treatment plan with patient and/or surrogate as well as nursing, discussions with consultants, evaluation of patient's response to treatment, examination of patient, obtaining history from patient or surrogate, ordering and performing treatments and interventions, ordering and review of laboratory studies, ordering and review of radiographic studies, pulse oximetry and re-evaluation of patient's condition.

## 2023-10-20 NOTE — Progress Notes (Signed)
 eLink Physician-Brief Progress Note Patient Name: Anita Aguirre DOB: [birth year] MRN: [id number]   Date of Service  10/20/2023  HPI/Events of Note  Patient requesting medication for pain.  eICU Interventions  Morphine 2 mg iv x 1 ordered.        Eris Breck U Angelica Wix 10/20/2023, 2:04 AM

## 2023-10-20 NOTE — Progress Notes (Signed)
 Spoke with Dr. Gardenia with AHF team. Patient arrived from [facility] earlier this morning. On my evaluation she is still tachypneic, HR 102. SBP 80-90 on 0.25 milrinone. We had ongoing discussion about IR vs ECMO. Patient not thrombectomy candidate. She states she has had increased vaginal bleeding since being started on heparin gtt so have reservations about IR TNK. I have placed CVC line. Will obtain STAT repeat lactic acid, troponin, coox. Will start norepinephrine for SBP goal >100 per Dr. Gardenia and reassess after lab work up resulted.  Tinnie FORBES Furth, PA-C [facility] 10/20/23 6:30 AM  Please see [email or web address] for pager details.  From 7A-7P if no response, please call [phone] After hours, please call ELink [phone]

## 2023-10-20 NOTE — H&P (View-Only) (Signed)
   ADVANCED HEART FAILURE CONSULT NOTE  Referring Physician: Isadora Hose, MD  Primary Care: Pcp, No Heart Failure: Sneha Willig, DO  CC: SCAI D Cardiogenic shock 2/2 RVF (Acute pulmonary embolism)  HPI: Anita Aguirre is a 48 y.o. female with hypertension, hypothyroidism, mastitis, bleeding uterine fibroids on OCPs that presented [facility] yesterday with a 2-day history of shortness of breath and chest pain.  Workup in the emergency department notable for pulmonary emboli with severe clot burden in the segmental and subsegmental branches bilaterally.  Overnight due to concern for RV failure patient was transferred to [facility].  This morning while on milrinone 0.25 mcg/kg/min, patient started to become increasingly hypoxic, hypotensive with rising lactic acid and decreasing mixed venous.  Stat echocardiogram was obtained, notable for acute RV failure.  After discussion with critical care and interventional radiology decision made to cannulate for VA ECMO followed by catheter directed thrombolytics with possible uterine artery embolization.   PHYSICAL EXAM: Vitals:   10/20/23 1615 10/20/23 1630  BP: 124/73 109/87  Pulse: (!) 111 (!) 105  Resp: 19 (!) 30  Temp: (!) 100.4 F (38 C) (!) 100.4 F (38 C)  SpO2: 95% 94%   Lungs- decreased BS CARDIAC:  JVP: >10 cm          Normal rate with regular rhythm. no murmur.  Pulses 1+. 1-2+ edema.  ABDOMEN: soft EXTREMITIES: cool  DATA REVIEW  ECG: 10/20/23: sinus tachycardia as per my personal interpretation  ECHO: 10/20/23: Normal LV function; severely reduced RV function.   ASSESSMENT & PLAN:  SCAI D SHOCK 2/2 PE & RV Failure - CT chest with on 10/19/23 with bilateral pulmonary emboli with severe clot burden; clot is in the segmental and distal branches, not amenable to thrombectomy.  - Rising pressor requirement today with mixed venous in the 40s; lactic acid increasing.  - Will plan to cannulate for V-A ECMO followed by  catheter directed thrombolytics and uterine artery embolization in IR.  - Plan discussed with CCM, IR and ECMO team.   Ria Commander Advanced Heart Failure Mechanical Circulatory Support   CRITICAL CARE Performed by: Ria Commander   Total critical care time: 35 minutes  Critical care time was exclusive of separately billable procedures and treating other patients.  Critical care was necessary to treat or prevent imminent or life-threatening deterioration.  Critical care was time spent personally by me on the following activities: development of treatment plan with patient and/or surrogate as well as nursing, discussions with consultants, evaluation of patient's response to treatment, examination of patient, obtaining history from patient or surrogate, ordering and performing treatments and interventions, ordering and review of laboratory studies, ordering and review of radiographic studies, pulse oximetry and re-evaluation of patient's condition.

## 2023-10-20 NOTE — Progress Notes (Signed)
 Called to cath lab 6 after patient cannulated on VA ecmo. Per AHF, pressure dropped suddenly after cannulated. Hemoglobin checked and okay. No other issues. Think likely related to plastic from ecmo circuit. On levo 20, milrinone 0.125, adding vaso. Giving solu-cortef 100mg , methylene blue dose and bicarb. Leaving cath lab for CT then IR prior to returning to 2H.   Tinnie FORBES Furth, PA-C [facility] 10/20/23 7:48 PM  Please see [email or web address] for pager details.  From 7A-7P if no response, please call [phone] After hours, please call ELink [phone]

## 2023-10-20 NOTE — Progress Notes (Signed)
 RT NOTE:  Vented Pt transported to IR from CT without event.

## 2023-10-20 NOTE — Progress Notes (Signed)
   10/20/23 1616  Spiritual Encounters  Type of Visit Initial  Reason for visit Code  OnCall Visit Yes   Chaplain responded to page for Code STEMI. Per discussion with nurse, there was no spiritual need and the patient is going to the Cath Lab.  Chaplain Therisa Samuel

## 2023-10-20 NOTE — Progress Notes (Signed)
  Echocardiogram 2D Echocardiogram has been performed.  Anita Aguirre 10/20/2023, 3:44 PM

## 2023-10-20 NOTE — Procedures (Signed)
 Vascular and Interventional Radiology Procedure Note  Patient: Anita Aguirre DOB: 02-09-1976 Medical Record Number: [id number] Note Date/Time: 10/20/23 10:26 PM   Performing Physician: Thom Hall, MD Assistant(s): None  Diagnosis: Dysfunctional uterine bleeding on ECMO and pulmonary thrombolysis  Procedure(s):  PELVIC ARTERIOGRAPHY BILATERAL UTERINE ARTERY EMBOLIZATION for FIBROIDS   Anesthesia: ICU sedation Complications: None Estimated Blood Loss: Minimal Specimens: None  Findings:  - access via the RIGHT femoral artery. - Bilateral enlarged and tortuous uterine arteries. - Markedly enlarged uterine, filling the pelvic cavity - Successful embolization with Gelfoam to near-stasis. - AngioSeal closure at the R groin with distal RLE pulses at the end of the case.  Plan: - Post sheath removal precautions.  - Bedrest with RLE straight x2hrs.  Final report to follow once all images are reviewed and compared with previous studies.  See detailed dictation with images in PACS. The patient tolerated the procedure well without incident or complication and was returned to Recovery and ICU in critical condition.    Thom Hall, MD [facility] Radiology   Pager. [phone] Clinic. [phone]

## 2023-10-20 NOTE — Progress Notes (Signed)
 ECLS support: Cannulation date 10/20/23 Indication: RVF 2/2 PE   Configuration: V-A ECMO   Drainage cannula: 25Fr Right femoral vein  Return cannula: 19Fr left femoral artery Antegrade perfusion: 6Fr braided sheath SFA   Pump speed: 4.32LPM Pump flow: 3260   Sweep gas: 4   Circuit check: No thrombus noted  Anticoagulant: Heparin; plan to transition to bival.  Anticoagulation targets: 60-70   Changes in support:  - Plan for transfer to IR for catheter directed thrombolytics - Possible uterine artery embolization.   Anticipated goals/duration of support:  >48h  Greene Diodato 7:50 PM

## 2023-10-20 NOTE — Progress Notes (Addendum)
 RT NOTE:  Vented Pt transported to CT from Cath Lab without event.

## 2023-10-20 NOTE — Progress Notes (Signed)
 V-A ECMO (Right Femoral Vein - Left Femoral Artery) initiated at 1816 with a pump flow of 4.41 L/min and a gas sweep of 4.0 L/min on 100% oxygen.

## 2023-10-20 NOTE — Progress Notes (Signed)
  ECMO INITIATION   Patient: Anita Aguirre, 1975-09-10, 48 y.o. Location:   Date of Service:  10/20/2023     Time: 11:19 PM  Date of Admission: 10/20/2023 Admitting diagnosis: Acute pulmonary embolism with acute cor pulmonale, unspecified pulmonary embolism type (HCC)  Ht:   Wt: 106.2 kg BSA: Body surface area is 2.24 meters squared.  Blood Type: O POS Allergies:  Allergies  Allergen Reactions   Wellbutrin [Bupropion] Hives, Itching and Other (See Comments)    Skin crawling sensation    Past medical history:  Past Medical History:  Diagnosis Date   ADD (attention deficit disorder)    Arthritis 04/24/2008   Diffuse cystic mastopathy 04/24/2010   LEFT    Hypertension    Hypothyroidism    Mastitis    Past surgical history:  Past Surgical History:  Procedure Laterality Date   BREAST MASS EXCISION Left 2012   COLONOSCOPY  2005   [place] ? MD    INCISE AND DRAIN ABCESS  2010   MASTECTOMY, PARTIAL  2013    Indication for ECMO: Cardiogenic Shock  ECMO was deployed at 1539 and initiated at 1816  Anticoagulation achieved with Heparin bolus of 7000 units given to patient at 1813. Cannulated for VA and achieved initial ECMO flow 4.41 and ECMO sweep 4.    ECMO Cannula Information     Staff Present  Primary Perfusionist Marty Griffiths  Assisting Perfusionist/ECMO Specialist [facility]  Cannulating Physician Dr. Gwynne Commander   ECMO Lot Numbers  CardioHelp Console  [id number]  Oxygenator [id number]  Tubing Pack [id number]  ECMO Goals  Flow goal   4-4.5  Anticoagulation goal   APTT 50-70  Cardiac goal   MAP >65  Respiratory goal   pH 7.35-7.45, PO2 100-200  Other goal   Hgb >8, Plt >80   ECMO Handoff  Patient Information * Age Height Weight BSA IBW BMI  47 y.o.    (106.2 kg Body surface area is 2.24 meters squared. No data recorded Body mass index is 36.67 kg/m.   Review History * Primary Diagnosis   Acute pulmonary embolism with acute cor pulmonale,  unspecified pulmonary embolism type [facility])  Prior Cardiac Arrest within 24hrs of ECMO initiation? No  ECMO and MCS * Type ECMO Flow ECMO Sweep Gases   VA   4.41   4     Additional Mechanical Support N/A  Ventilation *    $ Ventilator Initial/Subsequent : Initial, Vent Mode: PRVC, Vt Set: 490 mL, Set Rate: 16 bmp, FiO2 (%): 100 %, I Time: 0.9 Sec(s), PEEP: (S) 5 cmH20 (Decreased following IR per MD)     Cannula Size and Locations       Drainage 25 Fr Multistage R Fem Vein   Return 19 Fr L Fem Artery, 6 Fr [facility]    *Cannula(e) sutured and anchored, secured and dressed.   Labs and Imaging *  *Cannulation position verified via imaging on arrival to ICU. Concerns communicated to attending surgeon. Labs reviewed.   All ECMO safety checks complete. ECMO flowsheet initiated, applicable charges captured, LDA's entered/confirmed, imaging and labs verified, blood products available, and report given to Darold Sharps, ES.

## 2023-10-20 NOTE — Progress Notes (Signed)
 Patient started complaining of shortness of breath and chest pain, her oxygen requirement went up to 6 L and then 11 L despite that she was not feeling well Lactate was repeated, it was going up from 1.8 now to 2.2  Stat echocardiogram was done which showed large RV with hypokinesis, positive McConnell sign and inverse D-shaped ventricle, consistent with pressure overload  Milrinone was increased to 0.375, Coox was repeated it was 48%  ECMO team was consulted, considering she is getting worse, decision was to proceed with VA ECMO cannulation  IR was consulted, after ECMO cannulation they will proceed with catheter directed thrombolysis, if she had vaginal bleeding then patient will go for angiogram and possible embolization of uterine artery.   Patient was taken to Cath Lab, advanced heart failure team started cannulating, patient was complaining of severe pain, she was given pain meds and Versed to elevate anxiety, despite that she continued to feel anxious and complaining of pain, she was intubated and placed on mechanical ventilation  She was started on Versed and fentanyl infusion  Initially patient O2 sat dropped down to 88%, PEEP was increased to 8, patient remain on 100% FiO2  I closely monitor her vital signs, she required Levophed initially at 5 mics then it increased to 10mics and then  Post ECMO cannulation, patient blood pressure was elevated, Levophed was titrated down.   Additional critical care time spent 92 minutes    Valinda Novas, MD

## 2023-10-20 NOTE — H&P (Signed)
 NAME:  Anita Aguirre, MRN:  [id number], DOB:  [birth year], LOS: 0 ADMISSION DATE:  10/20/2023, CONSULTATION DATE:  10/20/2023 REFERRING MD:  Dr Isadora, CHIEF COMPLAINT:  SOB/Chest pain  History of Present Illness:  48 y/o female with PMH for ADD (attention deficit disorder), Arthritis (04/24/2008), Diffuse cystic mastopathy (04/24/2010), Hypertension, Hypothyroidism,Mastitis and bleeding uterine fibroids who presented to [facility] with worsening SOB and chest pain for last 2 days worsening day of admission to hospital  Chest pain dull and not radiating.  She was found to have submassive Pulmonary embolism with right heart strain.  Her Troponin increased to 409, Lactic Acid down to 2.7 from 3.9. CT scan chest revealed, Bilateral pulmonary emboli with severe clot burden. 2. CT evidence of RIGHT ventricular strain. Positive for acute PE with CT evidence of right heart strain (RV/LV Ratio = 1.4) consistent with at least submassive (intermediate risk) PE. The presence of right heart strain has been associated with an increased risk of morbidity and mortality. Please refer to the Code PE Focused order set in EPIC. 3. Small pulmonary infarction in the RIGHT lower lobe. 4. Moderate hiatal hernia.  Patient says she has no previous h/o blood clots but does have a h/o spontaneous abortion.  She works from home and has a sit down job and her life style is very sedentary.  No family h/o Pulmonary emboli. At [facility] she was started on Heparin drip and Milrinone drip.  She is feeling better, currently not on pressors.  Case was d/w IR by Dr Isadora and she was not a good candidate for clot extraction but maybe a better case for catheter directed Thrombolysis despite have continually bleeding for several years, She says she has been having uterine bleeding since 2013 when she had a miscarriage.  Her uterine bleeding apparently from Fibroids has worsened over the last several months and she was being  considered for Hysterectomy at [facility] but has not made it there yet. No N/V/D, no Fever/chills, no recent flights or leg trauma.  She did c/o left calf pain over the last few days and increase size of left ankle. Denies LOC. Pertinent  Medical History  ADD (attention deficit disorder), Arthritis (04/24/2008), Diffuse cystic mastopathy (04/24/2010), Hypertension, Hypothyroidism, and Mastitis.   Significant Hospital Events: Including procedures, antibiotic start and stop dates in addition to other pertinent events   6/28: transfer from [place]  Interim History / Subjective:  N/a  Objective    Blood pressure (!) 99/56, pulse (!) 103, temperature 100.2 F (37.9 C), resp. rate (!) 31, weight 106.2 kg, SpO2 96%.       No intake or output data in the 24 hours ending 10/20/23 0355 Filed Weights   10/20/23 0320  Weight: 106.2 kg    Examination: General: Alert and awake NAD HENT: PERRLA, no icterus, EOMI, mmm Lungs: CTA no wheezes no rales Cardiovascular: tachy but no murmurs no gallops no rubs Abdomen: soft obese nt nd bs pos no guarding Extremities: no cyanosis, clubbing or edema Neuro: AAOx 3, CN II to XII grossly intact   Resolved problem list   Assessment and Plan  Acute submassive PE with right heart strain Will d/w IR about catheter directed Thrombolysis Continue with IV Heparin for now Continue with Milrinone Consult Cardiology No need for ECMO at this time. PESI score 118, high risk Previous h/o miscarriage-check Antiphospholipid Antibodies RLL Pulmonary Infarction  Continue to monitor Lactic Acidosis Will improve with improvement in clot burden Hypotension Secondary to PE Bleeding Fibroids  Monitor H/H and may need embolization. H/o Hypothyroidism, continue with Synthroid  Best Practice (right click and Reselect all SmartList Selections daily)   Diet/type: NPO w/ oral meds DVT prophylaxis systemic heparin Pressure ulcer(s): N/A GI prophylaxis: N/A Lines:  N/A Foley:  N/A Code Status:  full code   Labs   CBC: Recent Labs  Lab 10/19/23 1559 10/19/23 1856  WBC 15.2*  --   HGB 9.8* 9.6*  HCT 30.9* 30.9*  MCV 82.2  --   PLT 466*  --     Basic Metabolic Panel: Recent Labs  Lab 10/19/23 1559  NA 140  K 3.4*  CL 109  CO2 21*  GLUCOSE 109*  BUN 9  CREATININE 0.90  CALCIUM 9.2   GFR: Estimated Creatinine Clearance: 95.8 mL/min (by C-G formula based on SCr of 0.9 mg/dL). Recent Labs  Lab 10/19/23 1559 10/19/23 1639 10/19/23 1825 10/19/23 2110 10/19/23 2337  WBC 15.2*  --   --   --   --   LATICACIDVEN  --  3.4* 3.0* 3.9* 2.7*    Liver Function Tests: Recent Labs  Lab 10/19/23 1559  AST 28  ALT 12  ALKPHOS 22*  BILITOT 0.4  PROT 7.3  ALBUMIN 3.3*   No results for input(s): LIPASE, AMYLASE in the last 168 hours. No results for input(s): AMMONIA in the last 168 hours.  ABG No results found for: PHART, PCO2ART, PO2ART, HCO3, TCO2, ACIDBASEDEF, O2SAT   Coagulation Profile: Recent Labs  Lab 10/19/23 1710  INR 1.0    Cardiac Enzymes: No results for input(s): CKTOTAL, CKMB, CKMBINDEX, TROPONINI in the last 168 hours.  HbA1C: No results found for: HGBA1C  CBG: Recent Labs  Lab 10/19/23 2102  GLUCAP 177*    Review of Systems:   12 point ROS conducted  Past Medical History:  She,  has a past medical history of ADD (attention deficit disorder), Arthritis (04/24/2008), Diffuse cystic mastopathy (04/24/2010), Hypertension, Hypothyroidism, and Mastitis.   Surgical History:   Past Surgical History:  Procedure Laterality Date   BREAST MASS EXCISION Left 2012   COLONOSCOPY  2005   [place] ? MD    INCISE AND DRAIN ABCESS  2010   MASTECTOMY, PARTIAL  2013     Social History:   reports that she has quit smoking. Her smoking use included cigarettes. She has a 10 pack-year smoking history. She has never used smokeless tobacco. She reports that she does not drink alcohol and  does not use drugs.   Family History:  Her family history includes Diabetes in her paternal grandmother; Hypertension in her mother; Throat cancer (age of onset: 17) in her paternal aunt.   Allergies Allergies  Allergen Reactions   Bupropion Itching and Hives    Other Reaction(s): bugs under skin     Home Medications  Prior to Admission medications   Medication Sig Start Date End Date Taking? Authorizing Provider  amLODipine (NORVASC) 5 MG tablet Take 5 mg by mouth daily.    [provider]  Ferrous Gluconate (KP FERROUS GLUCONATE) 324 (37.5 Fe) MG TABS Take 1 tablet by mouth daily. 12/27/21   [provider]  ibuprofen  (ADVIL ) 800 MG tablet Take 1 tablet (800 mg total) by mouth every 8 (eight) hours as needed. Patient not taking: Reported on 10/19/2023 10/09/18   Leonce Garnette BIRCH, MD  levonorgestrel  (MIRENA ) 20 MCG/24HR IUD 1 Intra Uterine Device (1 each total) by Intrauterine route once for 1 dose. 09/26/18 09/26/18  Leonce Garnette BIRCH, MD  levothyroxine (  SYNTHROID) 88 MCG tablet Take 88 mcg by mouth daily before breakfast. 09/26/23   [provider]  losartan-hydrochlorothiazide (HYZAAR) 50-12.5 MG tablet Take 1 tablet by mouth daily. 10/17/23   [provider]  medroxyPROGESTERone  (PROVERA ) 10 MG tablet Take 2 tablets (20 mg total) by mouth 3 (three) times daily for 7 days. Use this dose only for bleeding that does not respond to once daily dosing. 09/06/18 10/19/23  Leonce Garnette BIRCH, MD  medroxyPROGESTERone  (PROVERA ) 5 MG tablet Take 5 mg by mouth 3 (three) times daily. For 5-10 days for heavy bleeding    [provider]  methylphenidate (RITALIN) 10 MG tablet Take 20 mg by mouth 2 (two) times daily. 08/06/18   [provider]  Multiple Vitamin (MULTIVITAMIN WITH MINERALS) TABS Take 1 tablet by mouth daily.    [provider]  propranolol (INDERAL) 10 MG tablet Take 10 mg by mouth daily as needed. 05/16/23   [provider]   SM VITAMIN D3 50 MCG (2000 UT) CAPS Take 2,000 Units by mouth daily. 09/26/23   [provider]  TRI-LO-ESTARYLLA 0.18/0.215/0.25 MG-25 MCG TABS Take 1 tablet by mouth daily. 09/24/23   [provider]     Critical care time: 78   The patient is critically ill with multiple organ system failure and requires high complexity decision making for assessment and support, frequent evaluation and titration of therapies, advanced monitoring, review of radiographic studies and interpretation of complex data.   Critical Care Time devoted to patient care services, exclusive of separately billable procedures, described in this note is 45 minutes.   Orlin Fairly, MD [facility] See Amion for pager  If no response to pager , please call [phone] until 7pm After 7:00 pm call Elink  [phone] 10/20/2023, 3:55 AM

## 2023-10-20 NOTE — Consult Note (Addendum)
 Vascular and Interventional Radiology  PRE PROCEDURE Note  Assessment  Plan:   Anita Aguirre is a 48 y.o. year old female who will undergo PULMONARY ARTERIOGRAPHY, LYTIC CATHETER PLACEMENT and UTERINE ARTERY EMBOLIZATION in Interventional Radiology.  The procedure has been fully reviewed with the patient/patient's authorized representative. The risks, benefits and alternatives have been explained, and the patient/patient's authorized representative has consented to the procedure. -- The patient does not have a Do Not Resuscitate order in effect.  HPI: Anita Aguirre is a 48 y.o. year old female comorbid including menorrhagia from uterine fibroids on OCP. Pt preented to [facility] and found to have submassive PE. Pt failed conservative management, with worsening hypoxia requiring ECMO cannulation. VIR consulted for catheter-directed thrombolysis.  Pt post cannulation w hypotension. Cardiologist reported noting persistent vaginal / uterine bleeding, recommend empiric uterine artery embolization.  Pt intubated and sedated. Informed consent obtained from her mother, [facility] / POA Jules Agent). Informed consent was obtained, witnessed and placed in the patient's chart.  Allergies:  Allergies  Allergen Reactions   Wellbutrin [Bupropion] Hives, Itching and Other (See Comments)    Skin crawling sensation    Medications:  No current facility-administered medications on file prior to encounter.   Current Outpatient Medications on File Prior to Encounter  Medication Sig Dispense Refill   aspirin 81 MG chewable tablet Chew 81 mg by mouth daily.     Ferrous Gluconate (KP FERROUS GLUCONATE) 324 (37.5 Fe) MG TABS Take 1 tablet by mouth daily.     ibuprofen  (ADVIL ) 200 MG tablet Take 200 mg by mouth 2 (two) times daily as needed for headache or moderate pain (pain score 4-6).     levonorgestrel  (MIRENA ) 20 MCG/24HR IUD 1 Intra Uterine Device (1 each total) by Intrauterine route once for 1 dose. 1 each 0    levothyroxine (SYNTHROID) 88 MCG tablet Take 88 mcg by mouth daily before breakfast.     losartan-hydrochlorothiazide (HYZAAR) 50-12.5 MG tablet Take 1 tablet by mouth daily.     medroxyPROGESTERone  (PROVERA ) 5 MG tablet Take 5 mg by mouth See admin instructions. Take 1 tablet (5mg ) by mouth three times daily for  5-10 days for heavy bleeding.     methylphenidate (RITALIN) 10 MG tablet Take 20 mg by mouth 2 (two) times daily.     SM VITAMIN D3 50 MCG (2000 UT) CAPS Take 2,000 Units by mouth daily.      PSH:  Past Surgical History:  Procedure Laterality Date   BREAST MASS EXCISION Left 2012   COLONOSCOPY  2005   [place] ? MD    INCISE AND DRAIN ABCESS  2010   MASTECTOMY, PARTIAL  2013    PMH:  Past Medical History:  Diagnosis Date   ADD (attention deficit disorder)    Arthritis 04/24/2008   Diffuse cystic mastopathy 04/24/2010   LEFT    Hypertension    Hypothyroidism    Mastitis     Brief Physical Examination: Vitals:   10/20/23 1630 10/20/23 1704  BP: 109/87   Pulse: (!) 105   Resp: (!) 30   Temp: (!) 100.4 F (38 C)   SpO2: 94% 95%   General: WD, female. Intubated / sedated HEENT: Normocephalic, atraumatic Lungs: on vent   Sedation / Anesthesia: ICU sedation Airway assessment: Alternate Mallampati: Alternate. Intubated   Thom Hall, MD [facility] Interventional Radiology [facility] Radiology   Pager. [phone] Clinic. [phone]

## 2023-10-20 NOTE — Consult Note (Signed)
 Pharmacy Consult Note - Anticoagulation  Pharmacy Consult for heparin Indication: chest pain/ACS  PATIENT MEASUREMENTS: Height: 5' 7 (170.2 cm) Weight: 109 kg (240 lb 4.8 oz) IBW/kg (Calculated) : 61.6 HEPARIN DW (KG): 86.6  VITAL SIGNS: Temp: 100.6 F (38.1 C) (06/28 0000) Temp Source: Oral (06/27 1951) BP: 103/81 (06/28 0000) Pulse Rate: 103 (06/28 0000)  Recent Labs    10/19/23 1559 10/19/23 1710 10/19/23 1825 10/19/23 1856 10/19/23 2337  HGB 9.8*  --   --  9.6*  --   HCT 30.9*  --   --  30.9*  --   PLT 466*  --   --   --   --   APTT  --  27  --   --   --   LABPROT  --  14.2  --   --   --   INR  --  1.0  --   --   --   HEPARINUNFRC  --   --   --   --  0.12*  CREATININE 0.90  --   --   --   --   TROPONINIHS 393*  --  409*  --   --     Estimated Creatinine Clearance: 97.3 mL/min (by C-G formula based on SCr of 0.9 mg/dL).  PAST MEDICAL HISTORY: Past Medical History:  Diagnosis Date   ADD (attention deficit disorder)    Arthritis 04/24/2008   Diffuse cystic mastopathy 04/24/2010   LEFT    Hypertension    Hypothyroidism    Mastitis     ASSESSMENT: 48 y.o. female with PMH including PTSD, migraines, GERD is presenting with chest pain. cTn elevated at 393. Patient is not on chronic anticoagulation per chart review. Patient also endorses ongoing vaginal bleeding, hx uterine fibroids. Hgb currently 9.8. Pharmacy has been consulted to initiate and manage heparin intravenous infusion. Additional baseline labs have been ordered.  Pertinent medications: No chronic anticoag PTA  Goal(s) of therapy: Heparin level 0.3 - 0.7 units/mL Monitor platelets by anticoagulation protocol: Yes   Baseline anticoagulation labs: Recent Labs    10/19/23 1559 10/19/23 1710 10/19/23 1856  APTT  --  27  --   INR  --  1.0  --   HGB 9.8*  --  9.6*  PLT 466*  --   --     Date Time aPTT/HL Rate/Comment 06/27 2337 HL 0.12 subtherapeutic    PLAN: Give 2600 units bolus x  1 Increase heparin infusion to 1350 units/hour. Recheck HL in 6 hrs after rate change Monitor CBC daily while on heparin infusion especially given heavy vaginal bleeding  Rankin CANDIE Dills, PharmD, [facility] 10/20/2023 12:38 AM

## 2023-10-20 NOTE — Procedures (Signed)
 Vascular and Interventional Radiology Procedure Note  Patient: Anita Aguirre DOB: 1976/04/13 Medical Record Number: [id number] Note Date/Time: 10/20/23 7:21 PM   Performing Physician: Thom Hall, MD Assistant(s): None  Diagnosis: Submassive PE. Pulmonary HTN w R heart dysfunction on Echo.  Procedure:   PULMONARY ARTERIOGRAPHY THROMBOLYSIS, Catheter-directed   Anesthesia: ICU sedation Complications: None Estimated Blood Loss: Minimal Specimens: None  Findings:  - access via the RIGHT internal jugular vein. - Pulmonary arterial pressures, 58/25 [34 mean PAP] - PE burden at segmental pulmonary arteries bilaterally, with successful lytic catheter placement(s).  - RIGHT Uni-Fuse ; 90 cm infusion and 10 cm total length catheter - LEFT Uni-Fuse ; 90 cm infusion and 10 cm total length catheter  Plan: - each infusion catheter will receive 1 mg/hr tPA, x 12 hrs, for a total of 24 mg tPA over 12 hrs - q6h H/h, fibrinogen, and heparin levels - the sheaths will receive each 20 mL/hr of NS gtt for KVO - NPO vs Clear liquid diet recommended - Pharmacy [facility] team) consult for heparin level management.  - Page PCCM and VIR On Call if fibrinogen <150, severe HA, AMS, or new / acute GIB - Bedrest with RLE straight x 12 hrs, while the catheters are in place.  - VIR service to follow and will assess catheter function for removal on rounds in AM.  Final report to follow once all images are reviewed and compared with previous studies.  See detailed dictation with images in PACS. The patient tolerated the procedure well without incident or complication and was returned to ICU in critical condition.    Thom Hall, MD [facility] Interventional [facility]iology [facility]ists [facility] Radiology   Pager. [phone] Clinic. [phone]

## 2023-10-20 NOTE — Progress Notes (Addendum)
 48 year old female who was transferred from [facility] earlier in the morning with submassive PE and cardiogenic shock requiring Levophed and milrinone  Patient stated feeling better, still on low-dose Levophed and milrinone Denies shortness of breath, currently on 7 L nasal cannula oxygen  Physical exam: General: Middle-aged obese female, lying on the bed HEENT: [place]/AT, eyes anicteric.  moist mucus membranes Neuro: Alert, awake following commands Chest: Coarse breath sounds, no wheezes or rhonchi Heart: Regular rate and rhythm, no murmurs or gallops Abdomen: Soft, nontender, nondistended, bowel sounds present  Labs and images reviewed  Assessment and plan: Acute bilateral submassive PE with right heart strain (acute cor pulmonale) Cardiogenic shock on Levophed and milrinone Right upper lobe lung infection Acute respiratory failure with hypoxia Lactic acidosis, cleared Vaginal bleeding due to fibroid Hypothyroidism Obesity Hypokalemia  Continue IV heparin infusion Patient is not a candidate for mechanical thrombectomy considering clots are more distal than central She is having vaginal bleeding so not candidate for catheter directed thrombolysis Her lactate has cleared, troponins are trending down Echocardiogram is pending I do not think patient meets criteria for ECMO Patient will follow at [facility] for vaginal bleeding, she is planned to have hysterectomy at some point Continue levothyroxine Continue aggressive electrolyte replacement Diet and exercise counseling provided  The patient is critically ill due to acute submassive PE with cor pulmonale and cardiogenic shock.  Critical care was necessary to treat or prevent imminent or life-threatening deterioration.  Critical care was time spent personally by me on the following activities: development of treatment plan with patient and/or surrogate as well as nursing, discussions with consultants, evaluation of patient's  response to treatment, examination of patient, obtaining history from patient or surrogate, ordering and performing treatments and interventions, ordering and review of laboratory studies, ordering and review of radiographic studies, pulse oximetry, re-evaluation of patient's condition and participation in multidisciplinary rounds.   During this encounter critical care time was devoted to patient care services described in this note for 37 minutes.     Valinda Novas, MD [place] [facility] See Amion for pager If no response to pager, please call [phone] until 7pm After 7pm, Please call E-link [phone]

## 2023-10-20 NOTE — Progress Notes (Signed)
 PHARMACY - ANTICOAGULATION CONSULT NOTE  Pharmacy Consult for heparin Indication: submassive pulmonary embolism  Allergies  Allergen Reactions   Bupropion Itching and Hives    Other Reaction(s): bugs under skin    Patient Measurements: Weight: 106.2 kg (234 lb 2.1 oz)  Vital Signs: Temp: 99.7 F (37.6 C) (06/28 0730) Temp Source: Bladder (06/28 0320) BP: 93/68 (06/28 0730) Pulse Rate: 112 (06/28 0730)  Labs: Recent Labs    10/19/23 1559 10/19/23 1710 10/19/23 1825 10/19/23 1856 10/19/23 2337 10/20/23 0643 10/20/23 0911  HGB 9.8*  --   --  9.6*  --  8.4*  --   HCT 30.9*  --   --  30.9*  --  27.3*  --   PLT 466*  --   --   --   --  426*  --   APTT  --  27  --   --   --   --   --   LABPROT  --  14.2  --   --   --   --   --   INR  --  1.0  --   --   --   --   --   HEPARINUNFRC  --   --   --   --  0.12*  --  0.15*  CREATININE 0.90  --   --   --   --  1.09*  --   TROPONINIHS 393*  --  409*  --   --  186*  --     Estimated Creatinine Clearance: 79.1 mL/min (A) (by C-G formula based on SCr of 1.09 mg/dL (H)).   Medical History: Past Medical History:  Diagnosis Date   ADD (attention deficit disorder)    Arthritis 04/24/2008   Diffuse cystic mastopathy 04/24/2010   LEFT    Hypertension    Hypothyroidism    Mastitis     Medications:  Medications Prior to Admission  Medication Sig Dispense Refill Last Dose/Taking   amLODipine (NORVASC) 5 MG tablet Take 5 mg by mouth daily.      Ferrous Gluconate (KP FERROUS GLUCONATE) 324 (37.5 Fe) MG TABS Take 1 tablet by mouth daily.      ibuprofen  (ADVIL ) 800 MG tablet Take 1 tablet (800 mg total) by mouth every 8 (eight) hours as needed. (Patient not taking: Reported on 10/19/2023) 60 tablet 1    levonorgestrel  (MIRENA ) 20 MCG/24HR IUD 1 Intra Uterine Device (1 each total) by Intrauterine route once for 1 dose. 1 each 0    levothyroxine (SYNTHROID) 88 MCG tablet Take 88 mcg by mouth daily before breakfast.       losartan-hydrochlorothiazide (HYZAAR) 50-12.5 MG tablet Take 1 tablet by mouth daily.      medroxyPROGESTERone  (PROVERA ) 10 MG tablet Take 2 tablets (20 mg total) by mouth 3 (three) times daily for 7 days. Use this dose only for bleeding that does not respond to once daily dosing. 42 tablet 0    medroxyPROGESTERone  (PROVERA ) 5 MG tablet Take 5 mg by mouth 3 (three) times daily. For 5-10 days for heavy bleeding      methylphenidate (RITALIN) 10 MG tablet Take 20 mg by mouth 2 (two) times daily.      Multiple Vitamin (MULTIVITAMIN WITH MINERALS) TABS Take 1 tablet by mouth daily.      propranolol (INDERAL) 10 MG tablet Take 10 mg by mouth daily as needed.      SM VITAMIN D3 50 MCG (2000 UT) CAPS Take 2,000 Units by  mouth daily.      TRI-LO-ESTARYLLA 0.18/0.215/0.25 MG-25 MCG TABS Take 1 tablet by mouth daily.      Scheduled:   Chlorhexidine Gluconate Cloth  6 each Topical Daily   insulin aspart  0-20 Units Subcutaneous Q4H    Assessment: 47 YOF admitted with submassive PE with right heart strain. RV/LV ration 1.4. CT with bilateral PE with severe clot burden. Patient not on anticoagulation prior to hospitalization. Patient also has ongoing vaginal bleeding due to uterine fibroids, noted to be a chronic issue. Pharmacy has been consulted for heparin dosing.   6/28: Heparin level 0.15, subtherapeutic on heparin 1350 units/hr. No issues with infusion noted. Patient having ongoing vaginal bleeding due to uterine fibroids. Hgb remains low at 8.4, PLT stable at 426.  Goal of Therapy:  Heparin level 0.3-0.7 units/ml Monitor platelets by anticoagulation protocol: Yes   Plan:  Heparin 2000 units bolus x1, then increase heparin to 1600 units/hr Monitor heparin level in 6 hours Monitor heparin level and CBC daily F/u PO AC plan  Morna Breach, PharmD PGY2 Cardiology Pharmacy Resident 10/20/2023 10:53 AM

## 2023-10-20 NOTE — Interval H&P Note (Signed)
 History and Physical Interval Note:  10/20/2023 7:06 PM  Anita Aguirre  has presented today for surgery, with the diagnosis of pulomonary embolism.  The various methods of treatment have been discussed with the patient and family. After consideration of risks, benefits and other options for treatment, the patient has consented to  Procedure(s): ECMO CANNULATION (N/A) as a surgical intervention.  The patient's history has been reviewed, patient examined, no change in status, stable for surgery.  I have reviewed the patient's chart and labs.  Questions were answered to the patient's satisfaction.     Tel Hevia

## 2023-10-20 NOTE — Progress Notes (Signed)
 PHARMACY ANTIBIOTIC CONSULT NOTE   Anita Aguirre a 48 y.o. female admitted on 6/28 with bilateral PE w/ RHS cannulated for [facility] ECMO 6/28 PM.  Pharmacy has been consulted for Vancomycin and Merrem dosing.  Tm 100.15F, Scr 1.20, WBC 18.0   Estimated Creatinine Clearance: 71.9 mL/min (A) (by C-G formula based on SCr of 1.2 mg/dL (H)).  Plan: Merrem 1g IV Q8h Vancomycin 2000 mg IV x1 load, then variable dosing based on levels to target trough 15-20 mg/L F/u level with AM labs Monitor renal function, clinical status, de-escalation, C/S, levels as indicated   Allergies:  Allergies  Allergen Reactions   Wellbutrin [Bupropion] Hives, Itching and Other (See Comments)    Skin crawling sensation    Filed Weights   10/20/23 0320  Weight: 106.2 kg (234 lb 2.1 oz)       Latest Ref Rng & Units 10/20/2023    7:28 PM 10/20/2023    6:45 PM 10/20/2023    3:34 PM  CBC  Hemoglobin 12.0 - 15.0 g/dL 87.9 - 84.9 g/dL 7.8    8.2  7.1  8.8   Hematocrit 36.0 - 46.0 % 36.0 - 46.0 % 23.0    24.0  21.0  26.0     Antibiotics Given (last 72 hours)     None       Antimicrobials this admission: Vancomycin 6/28 >  Merrem 6/28 >   Microbiology results: 6/28 MRSA PCR: negative  Thank you for allowing pharmacy to be a part of this patient's care.  Maurilio Fila, PharmD Clinical Pharmacist 10/20/2023  9:35 PM

## 2023-10-20 NOTE — Procedures (Signed)
 Intubation Procedure Note  Anita Aguirre  [id number]  03-06-1976  Date:10/20/23  Time:6:24 PM   Provider Performing:Ysidra Sopher    Procedure: Intubation (31500)  Indication(s) Respiratory Failure  Consent Risks of the procedure as well as the alternatives and risks of each were explained to the patient and/or caregiver.  Consent for the procedure was obtained and is signed in the bedside chart   Anesthesia Versed, Fentanyl, Rocuronium, and Ketamine   Time Out Verified patient identification, verified procedure, site/side was marked, verified correct patient position, special equipment/implants available, medications/allergies/relevant history reviewed, required imaging and test results available.   Sterile Technique Usual hand hygeine, masks, and gloves were used   Procedure Description Patient positioned in bed supine.  Sedation given as noted above.  Patient was intubated with endotracheal tube using DL.  View was Grade 1 full glottis .  Number of attempts was 1.  Colorimetric CO2 detector was consistent with tracheal placement.   Complications/Tolerance None; patient tolerated the procedure well. Chest X-ray is ordered to verify placement.   EBL Minimal   Specimen(s) None

## 2023-10-20 NOTE — Procedures (Signed)
 Central Venous Catheter Insertion Procedure Note  Anita Aguirre  [id number]  06-08-1975  Date:10/20/23  Time:6:24 AM   Provider Performing:Anita Aguirre   Procedure: Insertion of Non-tunneled Central Venous [phone]) with US  guidance (23062)   Indication(s) Medication administration and Difficult access  Consent Risks of the procedure as well as the alternatives and risks of each were explained to the patient and/or caregiver.  Consent for the procedure was obtained and is signed in the bedside chart  Anesthesia Topical only with 1% lidocaine   Timeout Verified patient identification, verified procedure, site/side was marked, verified correct patient position, special equipment/implants available, medications/allergies/relevant history reviewed, required imaging and test results available.  Sterile Technique Maximal sterile technique including full sterile barrier drape, hand hygiene, sterile gown, sterile gloves, mask, hair covering, sterile ultrasound probe cover (if used).  Procedure Description Area of catheter insertion was cleaned with chlorhexidine and draped in sterile fashion.  With real-time ultrasound guidance a central venous catheter was placed into the right internal jugular vein. Nonpulsatile blood flow and easy flushing noted in all ports.  The catheter was sutured in place and sterile dressing applied.  Complications/Tolerance None; patient tolerated the procedure well. Chest X-ray is ordered to verify placement for internal jugular or subclavian cannulation.   Chest x-ray is not ordered for femoral cannulation.  EBL Minimal  Specimen(s) None   Anita FORBES [name], PA-C [facility] 10/20/23 6:24 AM  Please see [email or web address] for pager details.  From 7A-7P if no response, please call [phone] After hours, please call ELink [phone]

## 2023-10-21 ENCOUNTER — Inpatient Hospital Stay (HOSPITAL_COMMUNITY): Payer: Self-pay

## 2023-10-21 DIAGNOSIS — E876 Hypokalemia: Secondary | ICD-10-CM

## 2023-10-21 DIAGNOSIS — N179 Acute kidney failure, unspecified: Secondary | ICD-10-CM

## 2023-10-21 DIAGNOSIS — D62 Acute posthemorrhagic anemia: Secondary | ICD-10-CM

## 2023-10-21 DIAGNOSIS — R570 Cardiogenic shock: Secondary | ICD-10-CM

## 2023-10-21 DIAGNOSIS — Z9281 Personal history of extracorporeal membrane oxygenation (ECMO): Secondary | ICD-10-CM

## 2023-10-21 HISTORY — PX: IR THROMB F/U EVAL ART/VEN FINAL DAY (MS): IMG5379

## 2023-10-21 LAB — POCT I-STAT 7, (LYTES, BLD GAS, ICA,H+H)
Acid-Base Excess: 0 mmol/L (ref 0.0–2.0)
Acid-base deficit: 7 mmol/L — ABNORMAL HIGH (ref 0.0–2.0)
Acid-base deficit: 6 mmol/L — ABNORMAL HIGH (ref 0.0–2.0)
Acid-base deficit: 7 mmol/L — ABNORMAL HIGH (ref 0.0–2.0)
Acid-base deficit: 1 mmol/L (ref 0.0–2.0)
Acid-base deficit: 2 mmol/L (ref 0.0–2.0)
Acid-base deficit: 2 mmol/L (ref 0.0–2.0)
Bicarbonate: 18 mmol/L — ABNORMAL LOW (ref 20.0–28.0)
Bicarbonate: 18.3 mmol/L — ABNORMAL LOW (ref 20.0–28.0)
Bicarbonate: 18 mmol/L — ABNORMAL LOW (ref 20.0–28.0)
Bicarbonate: 22.9 mmol/L (ref 20.0–28.0)
Bicarbonate: 22.2 mmol/L (ref 20.0–28.0)
Bicarbonate: 22.7 mmol/L (ref 20.0–28.0)
Bicarbonate: 24.4 mmol/L (ref 20.0–28.0)
Calcium, Ion: 1.1 mmol/L — ABNORMAL LOW (ref 1.15–1.40)
Calcium, Ion: 1.13 mmol/L — ABNORMAL LOW (ref 1.15–1.40)
Calcium, Ion: 1.16 mmol/L (ref 1.15–1.40)
Calcium, Ion: 1.12 mmol/L — ABNORMAL LOW (ref 1.15–1.40)
Calcium, Ion: 1.1 mmol/L — ABNORMAL LOW (ref 1.15–1.40)
Calcium, Ion: 1.11 mmol/L — ABNORMAL LOW (ref 1.15–1.40)
Calcium, Ion: 1.1 mmol/L — ABNORMAL LOW (ref 1.15–1.40)
HCT: 25 % — ABNORMAL LOW (ref 36.0–46.0)
HCT: 25 % — ABNORMAL LOW (ref 36.0–46.0)
HCT: 26 % — ABNORMAL LOW (ref 36.0–46.0)
HCT: 23 % — ABNORMAL LOW (ref 36.0–46.0)
HCT: 27 % — ABNORMAL LOW (ref 36.0–46.0)
HCT: 23 % — ABNORMAL LOW (ref 36.0–46.0)
HCT: 24 % — ABNORMAL LOW (ref 36.0–46.0)
Hemoglobin: 8.5 g/dL — ABNORMAL LOW (ref 12.0–15.0)
Hemoglobin: 8.5 g/dL — ABNORMAL LOW (ref 12.0–15.0)
Hemoglobin: 8.8 g/dL — ABNORMAL LOW (ref 12.0–15.0)
Hemoglobin: 7.8 g/dL — ABNORMAL LOW (ref 12.0–15.0)
Hemoglobin: 9.2 g/dL — ABNORMAL LOW (ref 12.0–15.0)
Hemoglobin: 7.8 g/dL — ABNORMAL LOW (ref 12.0–15.0)
Hemoglobin: 8.2 g/dL — ABNORMAL LOW (ref 12.0–15.0)
O2 Saturation: 100 %
O2 Saturation: 99 %
O2 Saturation: 97 %
O2 Saturation: 94 %
O2 Saturation: 96 %
O2 Saturation: 95 %
O2 Saturation: 96 %
Patient temperature: 36.3
Patient temperature: 36.5
Patient temperature: 36.7
Patient temperature: 36.8
Patient temperature: 36.7
Patient temperature: 36.6
Patient temperature: 36.8
Potassium: 3.5 mmol/L (ref 3.5–5.1)
Potassium: 4.6 mmol/L (ref 3.5–5.1)
Potassium: 4.4 mmol/L (ref 3.5–5.1)
Potassium: 4.1 mmol/L (ref 3.5–5.1)
Potassium: 3.6 mmol/L (ref 3.5–5.1)
Potassium: 3.6 mmol/L (ref 3.5–5.1)
Potassium: 3.5 mmol/L (ref 3.5–5.1)
Sodium: 141 mmol/L (ref 135–145)
Sodium: 141 mmol/L (ref 135–145)
Sodium: 141 mmol/L (ref 135–145)
Sodium: 144 mmol/L (ref 135–145)
Sodium: 143 mmol/L (ref 135–145)
Sodium: 145 mmol/L (ref 135–145)
Sodium: 146 mmol/L — ABNORMAL HIGH (ref 135–145)
TCO2: 19 mmol/L — ABNORMAL LOW (ref 22–32)
TCO2: 19 mmol/L — ABNORMAL LOW (ref 22–32)
TCO2: 19 mmol/L — ABNORMAL LOW (ref 22–32)
TCO2: 24 mmol/L (ref 22–32)
TCO2: 23 mmol/L (ref 22–32)
TCO2: 24 mmol/L (ref 22–32)
TCO2: 25 mmol/L (ref 22–32)
pCO2 arterial: 33.3 mmHg (ref 32–48)
pCO2 arterial: 29.8 mmHg — ABNORMAL LOW (ref 32–48)
pCO2 arterial: 31.6 mmHg — ABNORMAL LOW (ref 32–48)
pCO2 arterial: 35.4 mmHg (ref 32–48)
pCO2 arterial: 34 mmHg (ref 32–48)
pCO2 arterial: 35.1 mmHg (ref 32–48)
pCO2 arterial: 36.8 mmHg (ref 32–48)
pH, Arterial: 7.336 — ABNORMAL LOW (ref 7.35–7.45)
pH, Arterial: 7.395 (ref 7.35–7.45)
pH, Arterial: 7.363 (ref 7.35–7.45)
pH, Arterial: 7.419 (ref 7.35–7.45)
pH, Arterial: 7.421 (ref 7.35–7.45)
pH, Arterial: 7.417 (ref 7.35–7.45)
pH, Arterial: 7.428 (ref 7.35–7.45)
pO2, Arterial: 192 mmHg — ABNORMAL HIGH (ref 83–108)
pO2, Arterial: 151 mmHg — ABNORMAL HIGH (ref 83–108)
pO2, Arterial: 95 mmHg (ref 83–108)
pO2, Arterial: 69 mmHg — ABNORMAL LOW (ref 83–108)
pO2, Arterial: 76 mmHg — ABNORMAL LOW (ref 83–108)
pO2, Arterial: 70 mmHg — ABNORMAL LOW (ref 83–108)
pO2, Arterial: 76 mmHg — ABNORMAL LOW (ref 83–108)

## 2023-10-21 LAB — CBC
HCT: 26.6 % — ABNORMAL LOW (ref 36.0–46.0)
HCT: 28.3 % — ABNORMAL LOW (ref 36.0–46.0)
HCT: 28.3 % — ABNORMAL LOW (ref 36.0–46.0)
Hemoglobin: 8.4 g/dL — ABNORMAL LOW (ref 12.0–15.0)
Hemoglobin: 8.9 g/dL — ABNORMAL LOW (ref 12.0–15.0)
Hemoglobin: 9 g/dL — ABNORMAL LOW (ref 12.0–15.0)
MCH: 25.6 pg — ABNORMAL LOW (ref 26.0–34.0)
MCH: 26.2 pg (ref 26.0–34.0)
MCH: 26.3 pg (ref 26.0–34.0)
MCHC: 31.4 g/dL (ref 30.0–36.0)
MCHC: 31.6 g/dL (ref 30.0–36.0)
MCHC: 31.8 g/dL (ref 30.0–36.0)
MCV: 81.6 fL (ref 80.0–100.0)
MCV: 82.3 fL (ref 80.0–100.0)
MCV: 83.1 fL (ref 80.0–100.0)
Platelets: 192 10*3/uL (ref 150–400)
Platelets: 233 10*3/uL (ref 150–400)
Platelets: 249 10*3/uL (ref 150–400)
RBC: 3.2 MIL/uL — ABNORMAL LOW (ref 3.87–5.11)
RBC: 3.44 MIL/uL — ABNORMAL LOW (ref 3.87–5.11)
RBC: 3.47 MIL/uL — ABNORMAL LOW (ref 3.87–5.11)
RDW: 19.6 % — ABNORMAL HIGH (ref 11.5–15.5)
RDW: 19.9 % — ABNORMAL HIGH (ref 11.5–15.5)
RDW: 20.2 % — ABNORMAL HIGH (ref 11.5–15.5)
WBC: 10.9 10*3/uL — ABNORMAL HIGH (ref 4.0–10.5)
WBC: 12.4 10*3/uL — ABNORMAL HIGH (ref 4.0–10.5)
WBC: 9.4 10*3/uL (ref 4.0–10.5)
nRBC: 0 % (ref 0.0–0.2)
nRBC: 0 % (ref 0.0–0.2)
nRBC: 0 % (ref 0.0–0.2)

## 2023-10-21 LAB — COMPREHENSIVE METABOLIC PANEL WITH GFR
ALT: 9 U/L (ref 0–44)
AST: 18 U/L (ref 15–41)
Albumin: 2.8 g/dL — ABNORMAL LOW (ref 3.5–5.0)
Alkaline Phosphatase: 14 U/L — ABNORMAL LOW (ref 38–126)
Anion gap: 9 (ref 5–15)
BUN: 12 mg/dL (ref 6–20)
CO2: 19 mmol/L — ABNORMAL LOW (ref 22–32)
Calcium: 7.6 mg/dL — ABNORMAL LOW (ref 8.9–10.3)
Chloride: 110 mmol/L (ref 98–111)
Creatinine, Ser: 1.16 mg/dL — ABNORMAL HIGH (ref 0.44–1.00)
GFR, Estimated: 58 mL/min — ABNORMAL LOW (ref >60)
Glucose, Bld: 131 mg/dL — ABNORMAL HIGH (ref 70–99)
Potassium: 3.2 mmol/L — ABNORMAL LOW (ref 3.5–5.1)
Sodium: 138 mmol/L (ref 135–145)
Total Bilirubin: 1.2 mg/dL (ref 0.0–1.2)
Total Protein: 5.1 g/dL — ABNORMAL LOW (ref 6.5–8.1)

## 2023-10-21 LAB — FIBRINOGEN
Fibrinogen: 281 mg/dL (ref 210–475)
Fibrinogen: 311 mg/dL (ref 210–475)
Fibrinogen: 324 mg/dL (ref 210–475)

## 2023-10-21 LAB — BASIC METABOLIC PANEL WITH GFR
Anion gap: 10 (ref 5–15)
Anion gap: 11 (ref 5–15)
BUN: 11 mg/dL (ref 6–20)
BUN: 9 mg/dL (ref 6–20)
CO2: 16 mmol/L — ABNORMAL LOW (ref 22–32)
CO2: 22 mmol/L (ref 22–32)
Calcium: 7.3 mg/dL — ABNORMAL LOW (ref 8.9–10.3)
Calcium: 7.5 mg/dL — ABNORMAL LOW (ref 8.9–10.3)
Chloride: 113 mmol/L — ABNORMAL HIGH (ref 98–111)
Chloride: 110 mmol/L (ref 98–111)
Creatinine, Ser: 1.05 mg/dL — ABNORMAL HIGH (ref 0.44–1.00)
Creatinine, Ser: 0.9 mg/dL (ref 0.44–1.00)
GFR, Estimated: >60 mL/min (ref >60)
GFR, Estimated: >60 mL/min (ref >60)
Glucose, Bld: 119 mg/dL — ABNORMAL HIGH (ref 70–99)
Glucose, Bld: 103 mg/dL — ABNORMAL HIGH (ref 70–99)
Potassium: 4.5 mmol/L (ref 3.5–5.1)
Potassium: 3.5 mmol/L (ref 3.5–5.1)
Sodium: 139 mmol/L (ref 135–145)
Sodium: 143 mmol/L (ref 135–145)

## 2023-10-21 LAB — HEMOGLOBIN AND HEMATOCRIT, BLOOD
HCT: 27 % — ABNORMAL LOW (ref 36.0–46.0)
HCT: 27.1 % — ABNORMAL LOW (ref 36.0–46.0)
Hemoglobin: 8.4 g/dL — ABNORMAL LOW (ref 12.0–15.0)
Hemoglobin: 8.4 g/dL — ABNORMAL LOW (ref 12.0–15.0)

## 2023-10-21 LAB — GLUCOSE, CAPILLARY
Glucose-Capillary: 114 mg/dL — ABNORMAL HIGH (ref 70–99)
Glucose-Capillary: 120 mg/dL — ABNORMAL HIGH (ref 70–99)
Glucose-Capillary: 117 mg/dL — ABNORMAL HIGH (ref 70–99)
Glucose-Capillary: 87 mg/dL (ref 70–99)
Glucose-Capillary: 91 mg/dL (ref 70–99)
Glucose-Capillary: 84 mg/dL (ref 70–99)
Glucose-Capillary: 84 mg/dL (ref 70–99)

## 2023-10-21 LAB — VANCOMYCIN, RANDOM: Vancomycin Rm: 11 ug/mL

## 2023-10-21 LAB — CG4 I-STAT (LACTIC ACID)
Lactic Acid, Venous: 1.2 mmol/L (ref 0.5–1.9)
Lactic Acid, Venous: 1.4 mmol/L (ref 0.5–1.9)

## 2023-10-21 LAB — PROTIME-INR
INR: 1.3 — ABNORMAL HIGH (ref 0.8–1.2)
Prothrombin Time: 16.9 s — ABNORMAL HIGH (ref 11.4–15.2)

## 2023-10-21 LAB — HEPARIN LEVEL (UNFRACTIONATED)
Heparin Unfractionated: 0.46 [IU]/mL (ref 0.30–0.70)
Heparin Unfractionated: 0.49 [IU]/mL (ref 0.30–0.70)
Heparin Unfractionated: <0.1 [IU]/mL — ABNORMAL LOW (ref 0.30–0.70)

## 2023-10-21 LAB — APTT
aPTT: 72 s — ABNORMAL HIGH (ref 24–36)
aPTT: 63 s — ABNORMAL HIGH (ref 24–36)

## 2023-10-21 LAB — LACTIC ACID, PLASMA
Lactic Acid, Venous: 2.3 mmol/L (ref 0.5–1.9)
Lactic Acid, Venous: 2.8 mmol/L (ref 0.5–1.9)

## 2023-10-21 LAB — COOXEMETRY PANEL
Carboxyhemoglobin: 1.6 % — ABNORMAL HIGH (ref 0.5–1.5)
Methemoglobin: 1.7 % — ABNORMAL HIGH (ref 0.0–1.5)
O2 Saturation: 77.2 %
Total hemoglobin: 9.3 g/dL — ABNORMAL LOW (ref 12.0–16.0)

## 2023-10-21 LAB — MAGNESIUM
Magnesium: 1.8 mg/dL (ref 1.7–2.4)
Magnesium: 2.8 mg/dL — ABNORMAL HIGH (ref 1.7–2.4)
Magnesium: 2.7 mg/dL — ABNORMAL HIGH (ref 1.7–2.4)

## 2023-10-21 LAB — PHOSPHORUS
Phosphorus: 3.6 mg/dL (ref 2.5–4.6)
Phosphorus: 3.3 mg/dL (ref 2.5–4.6)

## 2023-10-21 MED ORDER — FAMOTIDINE 20 MG PO TABS
20.0000 mg | ORAL_TABLET | Freq: Two times a day (BID) | ORAL | Status: DC
  Administered 2023-10-21 – 2023-10-28 (×14): 20 mg
  Filled 2023-10-21 (×15): qty 1

## 2023-10-21 MED ORDER — FUROSEMIDE 10 MG/ML IJ SOLN
6.0000 mg/h | INTRAVENOUS | Status: DC
  Administered 2023-10-21 – 2023-10-23 (×2): 4 mg/h via INTRAVENOUS
  Administered 2023-10-25 – 2023-10-26 (×2): 6 mg/h via INTRAVENOUS
  Filled 2023-10-21 (×4): qty 20

## 2023-10-21 MED ORDER — VITAL HIGH PROTEIN PO LIQD: 1000.0000 mL | ORAL | Status: DC

## 2023-10-21 MED ORDER — POTASSIUM CHLORIDE 10 MEQ/50ML IV SOLN
10.0000 meq | INTRAVENOUS | Status: AC
  Administered 2023-10-21 (×4): 10 meq via INTRAVENOUS
  Filled 2023-10-21 (×4): qty 50

## 2023-10-21 MED ORDER — EPINEPHRINE HCL 5 MG/250ML IV SOLN IN NS
0.5000 ug/min | INTRAVENOUS | Status: DC
  Filled 2023-10-21: qty 250

## 2023-10-21 MED ORDER — PROSOURCE TF20 ENFIT COMPATIBL EN LIQD
60.0000 mL | Freq: Every day | ENTERAL | Status: DC
  Filled 2023-10-21: qty 60

## 2023-10-21 MED ORDER — VANCOMYCIN HCL IN DEXTROSE 1-5 GM/200ML-% IV SOLN
1000.0000 mg | Freq: Two times a day (BID) | INTRAVENOUS | Status: DC
  Administered 2023-10-21 – 2023-10-22 (×2): 1000 mg via INTRAVENOUS
  Filled 2023-10-21 (×2): qty 200

## 2023-10-21 MED ORDER — FAMOTIDINE 20 MG PO TABS: 20.0000 mg | ORAL_TABLET | Freq: Two times a day (BID) | ORAL | Status: DC

## 2023-10-21 MED ORDER — VITAL 1.5 CAL PO LIQD
1000.0000 mL | ORAL | Status: DC
  Administered 2023-10-21 – 2023-10-22 (×2): 1000 mL
  Filled 2023-10-21 (×2): qty 1000

## 2023-10-21 MED ORDER — POTASSIUM CHLORIDE 20 MEQ PO PACK
40.0000 meq | PACK | Freq: Once | ORAL | Status: AC
  Administered 2023-10-21: 40 meq
  Filled 2023-10-21: qty 2

## 2023-10-21 MED ORDER — THIAMINE MONONITRATE 100 MG PO TABS
100.0000 mg | ORAL_TABLET | Freq: Every day | ORAL | Status: AC
  Administered 2023-10-21 – 2023-10-25 (×4): 100 mg
  Filled 2023-10-21 (×4): qty 1

## 2023-10-21 MED ORDER — HYDROMORPHONE HCL-NACL 50-0.9 MG/50ML-% IV SOLN
0.5000 mg/h | INTRAVENOUS | Status: DC
  Administered 2023-10-21: 2 mg/h via INTRAVENOUS
  Administered 2023-10-21: 4 mg/h via INTRAVENOUS
  Filled 2023-10-21 (×3): qty 50

## 2023-10-21 MED ORDER — ROCURONIUM BROMIDE 10 MG/ML (PF) SYRINGE
PREFILLED_SYRINGE | INTRAVENOUS | Status: AC
  Filled 2023-10-21: qty 10

## 2023-10-21 MED ORDER — SODIUM CHLORIDE 0.9 % IV SOLN
0.0400 mg/kg/h | INTRAVENOUS | Status: DC
  Administered 2023-10-21: 0.1 mg/kg/h via INTRAVENOUS
  Administered 2023-10-22: 0.08 mg/kg/h via INTRAVENOUS
  Administered 2023-10-24: 0.055 mg/kg/h via INTRAVENOUS
  Filled 2023-10-21 (×3): qty 250

## 2023-10-21 MED ORDER — HYDROMORPHONE BOLUS VIA INFUSION
0.2500 mg | INTRAVENOUS | Status: DC | PRN
  Administered 2023-10-21: 1 mg via INTRAVENOUS
  Administered 2023-10-22 – 2023-10-24 (×5): 2 mg via INTRAVENOUS

## 2023-10-21 MED ORDER — MAGNESIUM SULFATE 4 GM/100ML IV SOLN
4.0000 g | Freq: Once | INTRAVENOUS | Status: AC
  Administered 2023-10-21: 4 g via INTRAVENOUS
  Filled 2023-10-21: qty 100

## 2023-10-21 MED ORDER — PROSOURCE TF20 ENFIT COMPATIBL EN LIQD
60.0000 mL | Freq: Two times a day (BID) | ENTERAL | Status: DC
  Administered 2023-10-21 – 2023-10-24 (×6): 60 mL
  Filled 2023-10-21 (×7): qty 60

## 2023-10-21 MED ORDER — SODIUM BICARBONATE 8.4 % IV SOLN
100.0000 meq | Freq: Once | INTRAVENOUS | Status: AC
  Administered 2023-10-21: 100 meq via INTRAVENOUS
  Filled 2023-10-21: qty 100

## 2023-10-21 NOTE — Progress Notes (Addendum)
 PHARMACY - ANTICOAGULATION CONSULT NOTE  Pharmacy Consult for bivalirudin Indication: pulmonary embolus/ECMO  Allergies  Allergen Reactions   Wellbutrin [Bupropion] Hives, Itching and Other (See Comments)    Skin crawling sensation    Patient Measurements: Weight: 111.4 kg (245 lb 9.5 oz)  Vital Signs: Temp: 98.1 F (36.7 C) (06/29 1000) BP: 118/92 (06/28 2220) Pulse Rate: 61 (06/29 1000)  Labs: Recent Labs    10/19/23 1559 10/19/23 1710 10/19/23 1825 10/19/23 1856 10/20/23 0643 10/20/23 0911 10/20/23 1525 10/20/23 1534 10/20/23 1928 10/20/23 2325 10/20/23 2344 10/21/23 0055 10/21/23 0508 10/21/23 0754  HGB 9.8*  --   --    < > 8.4*  --  8.4*   < > 8.2*  7.8*   < > 9.0* 8.5* 8.9*  8.5* 8.8*  HCT 30.9*  --   --    < > 27.3*  --  27.2*   < > 24.0*  23.0*   < > 28.3* 25.0* 28.3*  25.0* 26.0*  PLT 466*  --   --   --  426*  --   --   --   --   --  249  --  233  --   APTT  --  27  --   --   --   --   --   --   --   --   --   --   --   --   LABPROT  --  14.2  --   --   --   --   --   --   --   --  16.9*  --   --   --   INR  --  1.0  --   --   --   --   --   --   --   --  1.3*  --   --   --   HEPARINUNFRC  --   --   --    < >  --    < > 0.29*  --   --   --  0.46  --  0.49  --   CREATININE 0.90  --   --   --  1.09*  --  1.22*  --  1.20*  --  1.16*  --  1.05*  --   TROPONINIHS 393*  --  409*  --  186*  --   --   --   --   --   --   --   --   --    < > = values in this interval not displayed.    Estimated Creatinine Clearance: 84.3 mL/min (A) (by C-G formula based on SCr of 1.05 mg/dL (H)).   Medical History: Past Medical History:  Diagnosis Date   ADD (attention deficit disorder)    Arthritis 04/24/2008   Diffuse cystic mastopathy 04/24/2010   LEFT    Hypertension    Hypothyroidism    Mastitis     Medications:  Scheduled:   sodium chloride    Intravenous Once   Chlorhexidine Gluconate Cloth  6 each Topical Daily   docusate  100 mg Per Tube BID    famotidine  20 mg Per Tube BID   feeding supplement (PROSource TF20)  60 mL Per Tube BID   insulin aspart  0-20 Units Subcutaneous Q4H   polyethylene glycol  17 g Per Tube Daily   sodium chloride  flush  3 mL Intravenous Q12H   thiamine  100  mg Per Tube Daily   vancomycin variable dose per unstable renal function (pharmacist dosing)   Does not apply See admin instructions   Infusions:   sodium chloride      sodium chloride  Stopped (10/21/23 1124)   sodium chloride  Stopped (10/21/23 1124)   bivalirudin (ANGIOMAX) 250 mg in sodium chloride  0.9 % 500 mL (0.5 mg/mL) infusion 0.1 mg/kg/hr (10/21/23 1300)   feeding supplement (VITAL 1.5 CAL)     fentaNYL infusion INTRAVENOUS 175 mcg/hr (10/21/23 1300)   furosemide (LASIX) 200 mg in dextrose 5 % 100 mL (2 mg/mL) infusion 4 mg/hr (10/21/23 1300)   meropenem (MERREM) IV Stopped (10/21/23 0657)   midazolam 10 mg/hr (10/21/23 1300)    Assessment: 47 YOF admitted with submassive PE with right heart strain. RV/LV ration 1.4. CT with bilateral PE with severe clot burden. Patient not on anticoagulation prior to hospitalization. Patient also has ongoing vaginal bleeding due to uterine fibroids, noted to be a chronic issue. Pharmacy initially consulted for heparin dosing. Patient later decompensated, started on ECMO on 6/28 PM. Patient received catheter directed alteplase overnight 6/28-6/29, heparin was paused for ~1 hour then resumed at 0100 until 0745 on 10/21/23. Heparin level was therapeutic at 0.49 in the morning morning. Pharmacy has now been consulted for bivalirudin dosing while patient is on ECMO.  6/29: Patient having ongoing vaginal bleeding due to uterine fibroids. Hgb remains low at 8.9, PLT decreased to 233.  Goal of Therapy:  aPTT 50-70 seconds Monitor platelets by anticoagulation protocol: Yes   Plan:  Start bivalirudin 0.1 mg/kg/hr aPTT in 4 hours Monitor CBC and s/sx of bleeding daily  Morna Breach, PharmD PGY2 Cardiology  Pharmacy Resident 10/21/2023 10:09 AM

## 2023-10-21 NOTE — Progress Notes (Signed)
 Referring Physician(s): Anita Aguirre  Supervising Physician: Anita Aguirre  Patient Status:  Anita Aguirre - In-pt  Chief Complaint:  Submassive PE, vaginal bleeding, hypoxia requiring ECMO  S/p PE lysis and [place] by Dr. Hughes on 6/28  Subjective:  Patient laying in bed, intubated. RN at bedside.  RN states that patient is doing good, requiring less pressors.   Allergies: Wellbutrin [bupropion]  Medications: Prior to Admission medications   Medication Sig Start Date End Date Taking? Authorizing Provider  aspirin 81 MG chewable tablet Chew 81 mg by mouth daily.   Yes [provider]  Ferrous Gluconate (KP FERROUS GLUCONATE) 324 (37.5 Fe) MG TABS Take 1 tablet by mouth daily. 12/27/21  Yes [provider]  ibuprofen  (ADVIL ) 200 MG tablet Take 200 mg by mouth 2 (two) times daily as needed for headache or moderate pain (pain score 4-6).   Yes [provider]  levonorgestrel  (MIRENA ) 20 MCG/24HR IUD 1 Intra Uterine Device (1 each total) by Intrauterine route once for 1 dose. 09/26/18 12/01/23 Yes Anita Garnette BIRCH, MD  levothyroxine (SYNTHROID) 88 MCG tablet Take 88 mcg by mouth daily before breakfast. 09/26/23  Yes [provider]  losartan-hydrochlorothiazide (HYZAAR) 50-12.5 MG tablet Take 1 tablet by mouth daily. 10/17/23  Yes [provider]  medroxyPROGESTERone  (PROVERA ) 5 MG tablet Take 5 mg by mouth See admin instructions. Take 1 tablet (5mg ) by mouth three times daily for  5-10 days for heavy bleeding.   Yes [provider]  methylphenidate (RITALIN) 10 MG tablet Take 20 mg by mouth 2 (two) times daily. 08/06/18  Yes [provider]  SM VITAMIN D3 50 MCG (2000 UT) CAPS Take 2,000 Units by mouth daily. 09/26/23  Yes [provider]     Vital Signs: BP (!) 118/92   Pulse 60   Temp 98.1 F (36.7 C)   Resp 16   Wt 245 lb 9.5 oz (111.4 kg)   SpO2 97%   BMI 38.47 kg/m   Physical Exam Vitals reviewed.   Constitutional:      General: She is not in acute distress. HENT:     Head:     Comments: Intubated, PE lysis catheter in right neck   Musculoskeletal:     Cervical back: Neck supple.   Skin:    General: Skin is warm and dry.     Comments: ECOM in bilateral CFA; R DP not palpable but the foot is warm and has good perfusion      Imaging: IR INFUSION THROMBOL ARTERIAL INITIAL (MS) Result Date: 10/21/2023 INDICATION: Submassive PE. RV/LV ratio 1.4. Failed conservative management, with hypoxemia requiring ECMO cannulation. EXAM: Title; BILATERAL PULMONARY ARTERIAL LYTIC CATHETER PLACEMENT Listed procedures; 1. ULTRASOUND GUIDANCE FOR VENOUS ACCESS X2 2. PULMONARY ARTERIOGRAPHY 3. PLACEMENT OF BILATERAL PULMONARY ARTERIAL LYTIC INFUSION CATHETERS COMPARISON:  CT AP, 10/19/2023 MEDICATIONS: None ANESTHESIA/SEDATION: Sedation by the ICU team was performed. Please see ICU RN log for details. CONTRAST:  50 mL Omnipaque 300 FLUOROSCOPY: Radiation Exposure Index and estimated peak skin dose (PSD); Reference air kerma (RAK), 100.9 mGy. Kerma-area product (KAP), 2394.8 uGy*m. COMPLICATIONS: None immediate. TECHNIQUE: Informed written consent was obtained from the patient and/or patient's representative after a discussion of the risks, benefits and alternatives to treatment. Questions regarding the procedure were encouraged and answered. A timeout was performed prior to the initiation of the procedure. The patient had been recently cannulated for ECMO. As such, the RIGHT internal jugular vein was selected for vascular access. The RIGHT neck was  prepped and draped in the usual sterile fashion, and a sterile drape was applied covering the operative field. Maximum barrier sterile technique with sterile gowns and gloves were used for the procedure. A timeout was performed prior to the initiation of the procedure. Local anesthesia was provided with 1% lidocaine. Under direct ultrasound guidance, the RIGHT internal  jugular vein was accessed with a micro puncture kit ultimately allowing placement of a 6 Fr 10 cm vascular sheath. Slightly cranial to this initial access, the RIGHT internal jugular vein was again accessed with an additional micropuncture kit ultimately allowing placement of an additional 6 Fr 10 cm vascular sheath. Ultrasound images were saved for procedural documentation purposes. With the use of a Bentson wire, a C2 catheter was advanced into the main pulmonary artery and a limited central pulmonary arteriogram was performed. Pressure measurements were then obtained from the main pulmonary artery. The catheter was advanced into the distal branch of the LEFT lower lobe pulmonary artery. Limited contrast injection confirmed appropriate positioning. Over an exchange length stiff Glidewire, the catheter was exchanged for a 90/10 cm multi side-hole infusion catheter. The procedure was then repeated on the patient's RIGHT pulmonary artery, and with the use of a stiff Rosen wire, access was obtained into a distal branch of the RIGHT lower lobe pulmonary artery allowing placement of a 90/10 cm multi side-hole infusion catheter. A postprocedural fluoroscopic image was obtained to document final catheter positioning. The external catheter tubing was secured at the RIGHT neck and the lytic therapy was initiated. The patient tolerated the procedure well without immediate postprocedural complication. FINDINGS: Central pulmonary arteriogram demonstrates pulmonary emboli at the BILATERAL inferior lobar segmental pulmonary arteries. Acquired pressure measurements: Main  pulmonary artery; 58/55, mean 34 mmHg (normal: < 25/10) Following the procedure, both infusion catheter tips terminate within the distal LEFT and RIGHT pulmonary arteries. IMPRESSION: Successful initiation of BILATERAL catheter-directed pulmonary arterial lysis for submassive pulmonary emboli, from a RIGHT jugular approach. PLAN: Lytic catheter infusion rates as  described per order set. Vascular Interventional Radiology will follow the patient with anticipated catheter removal during morning rounds. Anita Hall, MD [facility] Electronically Signed   By: Anita Aguirre M.D.   On: 10/21/2023 11:52   IR INFUSION THROMBOL ARTERIAL INITIAL (MS) Result Date: 10/21/2023 INDICATION: Submassive PE. RV/LV ratio 1.4. Failed conservative management, with hypoxemia requiring ECMO cannulation. EXAM: Title; BILATERAL PULMONARY ARTERIAL LYTIC CATHETER PLACEMENT Listed procedures; 1. ULTRASOUND GUIDANCE FOR VENOUS ACCESS X2 2. PULMONARY ARTERIOGRAPHY 3. PLACEMENT OF BILATERAL PULMONARY ARTERIAL LYTIC INFUSION CATHETERS COMPARISON:  CT AP, 10/19/2023 MEDICATIONS: None ANESTHESIA/SEDATION: Sedation by the ICU team was performed. Please see ICU RN log for details. CONTRAST:  50 mL Omnipaque 300 FLUOROSCOPY: Radiation Exposure Index and estimated peak skin dose (PSD); Reference air kerma (RAK), 100.9 mGy. Kerma-area product (KAP), 2394.8 uGy*m. COMPLICATIONS: None immediate. TECHNIQUE: Informed written consent was obtained from the patient and/or patient's representative after a discussion of the risks, benefits and alternatives to treatment. Questions regarding the procedure were encouraged and answered. A timeout was performed prior to the initiation of the procedure. The patient had been recently cannulated for ECMO. As such, the RIGHT internal jugular vein was selected for vascular access. The RIGHT neck was prepped and draped in the usual sterile fashion, and a sterile drape was applied covering the operative field. Maximum barrier sterile technique with sterile gowns and gloves were used for the procedure. A timeout was performed prior to the initiation of the procedure. Local  anesthesia was provided with 1% lidocaine. Under direct ultrasound guidance, the RIGHT internal jugular vein was accessed with a micro puncture kit  ultimately allowing placement of a 6 Fr 10 cm vascular sheath. Slightly cranial to this initial access, the RIGHT internal jugular vein was again accessed with an additional micropuncture kit ultimately allowing placement of an additional 6 Fr 10 cm vascular sheath. Ultrasound images were saved for procedural documentation purposes. With the use of a Bentson wire, a C2 catheter was advanced into the main pulmonary artery and a limited central pulmonary arteriogram was performed. Pressure measurements were then obtained from the main pulmonary artery. The catheter was advanced into the distal branch of the LEFT lower lobe pulmonary artery. Limited contrast injection confirmed appropriate positioning. Over an exchange length stiff Glidewire, the catheter was exchanged for a 90/10 cm multi side-hole infusion catheter. The procedure was then repeated on the patient's RIGHT pulmonary artery, and with the use of a stiff Rosen wire, access was obtained into a distal branch of the RIGHT lower lobe pulmonary artery allowing placement of a 90/10 cm multi side-hole infusion catheter. A postprocedural fluoroscopic image was obtained to document final catheter positioning. The external catheter tubing was secured at the RIGHT neck and the lytic therapy was initiated. The patient tolerated the procedure well without immediate postprocedural complication. FINDINGS: Central pulmonary arteriogram demonstrates pulmonary emboli at the BILATERAL inferior lobar segmental pulmonary arteries. Acquired pressure measurements: Main  pulmonary artery; 58/55, mean 34 mmHg (normal: < 25/10) Following the procedure, both infusion catheter tips terminate within the distal LEFT and RIGHT pulmonary arteries. IMPRESSION: Successful initiation of BILATERAL catheter-directed pulmonary arterial lysis for submassive pulmonary emboli, from a RIGHT jugular approach. PLAN: Lytic catheter infusion rates as described per order set. Vascular Interventional  Radiology will follow the patient with anticipated catheter removal during morning rounds. Anita Hall, MD [facility] Electronically Signed   By: Anita Aguirre M.D.   On: 10/21/2023 11:52   IR US  Guide Vasc Access Right Result Date: 10/21/2023 INDICATION: Submassive PE. RV/LV ratio 1.4. Failed conservative management, with hypoxemia requiring ECMO cannulation. EXAM: Title; BILATERAL PULMONARY ARTERIAL LYTIC CATHETER PLACEMENT Listed procedures; 1. ULTRASOUND GUIDANCE FOR VENOUS ACCESS X2 2. PULMONARY ARTERIOGRAPHY 3. PLACEMENT OF BILATERAL PULMONARY ARTERIAL LYTIC INFUSION CATHETERS COMPARISON:  CT AP, 10/19/2023 MEDICATIONS: None ANESTHESIA/SEDATION: Sedation by the ICU team was performed. Please see ICU RN log for details. CONTRAST:  50 mL Omnipaque 300 FLUOROSCOPY: Radiation Exposure Index and estimated peak skin dose (PSD); Reference air kerma (RAK), 100.9 mGy. Kerma-area product (KAP), 2394.8 uGy*m. COMPLICATIONS: None immediate. TECHNIQUE: Informed written consent was obtained from the patient and/or patient's representative after a discussion of the risks, benefits and alternatives to treatment. Questions regarding the procedure were encouraged and answered. A timeout was performed prior to the initiation of the procedure. The patient had been recently cannulated for ECMO. As such, the RIGHT internal jugular vein was selected for vascular access. The RIGHT neck was prepped and draped in the usual sterile fashion, and a sterile drape was applied covering the operative field. Maximum barrier sterile technique with sterile gowns and gloves were used for the procedure. A timeout was performed prior to the initiation of the procedure. Local anesthesia was provided with 1% lidocaine. Under direct ultrasound guidance, the RIGHT internal jugular vein was accessed with a micro puncture kit ultimately allowing placement of a 6 Fr 10 cm vascular sheath.  Slightly cranial to this initial access, the RIGHT internal jugular  vein was again accessed with an additional micropuncture kit ultimately allowing placement of an additional 6 Fr 10 cm vascular sheath. Ultrasound images were saved for procedural documentation purposes. With the use of a Bentson wire, a C2 catheter was advanced into the main pulmonary artery and a limited central pulmonary arteriogram was performed. Pressure measurements were then obtained from the main pulmonary artery. The catheter was advanced into the distal branch of the LEFT lower lobe pulmonary artery. Limited contrast injection confirmed appropriate positioning. Over an exchange length stiff Glidewire, the catheter was exchanged for a 90/10 cm multi side-hole infusion catheter. The procedure was then repeated on the patient's RIGHT pulmonary artery, and with the use of a stiff Rosen wire, access was obtained into a distal branch of the RIGHT lower lobe pulmonary artery allowing placement of a 90/10 cm multi side-hole infusion catheter. A postprocedural fluoroscopic image was obtained to document final catheter positioning. The external catheter tubing was secured at the RIGHT neck and the lytic therapy was initiated. The patient tolerated the procedure well without immediate postprocedural complication. FINDINGS: Central pulmonary arteriogram demonstrates pulmonary emboli at the BILATERAL inferior lobar segmental pulmonary arteries. Acquired pressure measurements: Main  pulmonary artery; 58/55, mean 34 mmHg (normal: < 25/10) Following the procedure, both infusion catheter tips terminate within the distal LEFT and RIGHT pulmonary arteries. IMPRESSION: Successful initiation of BILATERAL catheter-directed pulmonary arterial lysis for submassive pulmonary emboli, from a RIGHT jugular approach. PLAN: Lytic catheter infusion rates as described per order set. Vascular Interventional Radiology will follow the patient with anticipated catheter  removal during morning rounds. Anita Hall, MD [facility] Electronically Signed   By: Anita Aguirre M.D.   On: 10/21/2023 11:52   IR Angiogram Pulmonary Bilateral Selective Result Date: 10/21/2023 INDICATION: Submassive PE. RV/LV ratio 1.4. Failed conservative management, with hypoxemia requiring ECMO cannulation. EXAM: Title; BILATERAL PULMONARY ARTERIAL LYTIC CATHETER PLACEMENT Listed procedures; 1. ULTRASOUND GUIDANCE FOR VENOUS ACCESS X2 2. PULMONARY ARTERIOGRAPHY 3. PLACEMENT OF BILATERAL PULMONARY ARTERIAL LYTIC INFUSION CATHETERS COMPARISON:  CT AP, 10/19/2023 MEDICATIONS: None ANESTHESIA/SEDATION: Sedation by the ICU team was performed. Please see ICU RN log for details. CONTRAST:  50 mL Omnipaque 300 FLUOROSCOPY: Radiation Exposure Index and estimated peak skin dose (PSD); Reference air kerma (RAK), 100.9 mGy. Kerma-area product (KAP), 2394.8 uGy*m. COMPLICATIONS: None immediate. TECHNIQUE: Informed written consent was obtained from the patient and/or patient's representative after a discussion of the risks, benefits and alternatives to treatment. Questions regarding the procedure were encouraged and answered. A timeout was performed prior to the initiation of the procedure. The patient had been recently cannulated for ECMO. As such, the RIGHT internal jugular vein was selected for vascular access. The RIGHT neck was prepped and draped in the usual sterile fashion, and a sterile drape was applied covering the operative field. Maximum barrier sterile technique with sterile gowns and gloves were used for the procedure. A timeout was performed prior to the initiation of the procedure. Local anesthesia was provided with 1% lidocaine. Under direct ultrasound guidance, the RIGHT internal jugular vein was accessed with a micro puncture kit ultimately allowing placement of a 6 Fr 10 cm vascular sheath. Slightly cranial to this initial access, the RIGHT  internal jugular vein was again accessed with an additional micropuncture kit ultimately allowing placement of an additional 6 Fr 10 cm vascular sheath. Ultrasound images were saved for procedural documentation purposes. With the use of a Bentson wire, a C2 catheter was advanced into the main pulmonary  artery and a limited central pulmonary arteriogram was performed. Pressure measurements were then obtained from the main pulmonary artery. The catheter was advanced into the distal branch of the LEFT lower lobe pulmonary artery. Limited contrast injection confirmed appropriate positioning. Over an exchange length stiff Glidewire, the catheter was exchanged for a 90/10 cm multi side-hole infusion catheter. The procedure was then repeated on the patient's RIGHT pulmonary artery, and with the use of a stiff Rosen wire, access was obtained into a distal branch of the RIGHT lower lobe pulmonary artery allowing placement of a 90/10 cm multi side-hole infusion catheter. A postprocedural fluoroscopic image was obtained to document final catheter positioning. The external catheter tubing was secured at the RIGHT neck and the lytic therapy was initiated. The patient tolerated the procedure well without immediate postprocedural complication. FINDINGS: Central pulmonary arteriogram demonstrates pulmonary emboli at the BILATERAL inferior lobar segmental pulmonary arteries. Acquired pressure measurements: Main  pulmonary artery; 58/55, mean 34 mmHg (normal: < 25/10) Following the procedure, both infusion catheter tips terminate within the distal LEFT and RIGHT pulmonary arteries. IMPRESSION: Successful initiation of BILATERAL catheter-directed pulmonary arterial lysis for submassive pulmonary emboli, from a RIGHT jugular approach. PLAN: Lytic catheter infusion rates as described per order set. Vascular Interventional Radiology will follow the patient with anticipated catheter removal during morning rounds. Anita Hall, MD [facility] Radiology Electronically Signed   By: Anita Aguirre M.D.   On: 10/21/2023 11:52   IR Angiogram Selective Each Additional Vessel Result Date: 10/21/2023 INDICATION: Submassive PE. RV/LV ratio 1.4. Failed conservative management, with hypoxemia requiring ECMO cannulation. EXAM: Title; BILATERAL PULMONARY ARTERIAL LYTIC CATHETER PLACEMENT Listed procedures; 1. ULTRASOUND GUIDANCE FOR VENOUS ACCESS X2 2. PULMONARY ARTERIOGRAPHY 3. PLACEMENT OF BILATERAL PULMONARY ARTERIAL LYTIC INFUSION CATHETERS COMPARISON:  CT AP, 10/19/2023 MEDICATIONS: None ANESTHESIA/SEDATION: Sedation by the ICU team was performed. Please see ICU RN log for details. CONTRAST:  50 mL Omnipaque 300 FLUOROSCOPY: Radiation Exposure Index and estimated peak skin dose (PSD); Reference air kerma (RAK), 100.9 mGy. Kerma-area product (KAP), 2394.8 uGy*m. COMPLICATIONS: None immediate. TECHNIQUE: Informed written consent was obtained from the patient and/or patient's representative after a discussion of the risks, benefits and alternatives to treatment. Questions regarding the procedure were encouraged and answered. A timeout was performed prior to the initiation of the procedure. The patient had been recently cannulated for ECMO. As such, the RIGHT internal jugular vein was selected for vascular access. The RIGHT neck was prepped and draped in the usual sterile fashion, and a sterile drape was applied covering the operative field. Maximum barrier sterile technique with sterile gowns and gloves were used for the procedure. A timeout was performed prior to the initiation of the procedure. Local anesthesia was provided with 1% lidocaine. Under direct ultrasound guidance, the RIGHT internal jugular vein was accessed with a micro puncture kit ultimately allowing placement of a 6 Fr 10 cm vascular sheath. Slightly cranial to this initial access, the RIGHT internal jugular vein was again accessed with an  additional micropuncture kit ultimately allowing placement of an additional 6 Fr 10 cm vascular sheath. Ultrasound images were saved for procedural documentation purposes. With the use of a Bentson wire, a C2 catheter was advanced into the main pulmonary artery and a limited central pulmonary arteriogram was performed. Pressure measurements were then obtained from the main pulmonary artery. The catheter was advanced into the distal branch of the LEFT lower lobe pulmonary artery. Limited contrast injection confirmed appropriate positioning. Over an exchange length  stiff Glidewire, the catheter was exchanged for a 90/10 cm multi side-hole infusion catheter. The procedure was then repeated on the patient's RIGHT pulmonary artery, and with the use of a stiff Rosen wire, access was obtained into a distal branch of the RIGHT lower lobe pulmonary artery allowing placement of a 90/10 cm multi side-hole infusion catheter. A postprocedural fluoroscopic image was obtained to document final catheter positioning. The external catheter tubing was secured at the RIGHT neck and the lytic therapy was initiated. The patient tolerated the procedure well without immediate postprocedural complication. FINDINGS: Central pulmonary arteriogram demonstrates pulmonary emboli at the BILATERAL inferior lobar segmental pulmonary arteries. Acquired pressure measurements: Main  pulmonary artery; 58/55, mean 34 mmHg (normal: < 25/10) Following the procedure, both infusion catheter tips terminate within the distal LEFT and RIGHT pulmonary arteries. IMPRESSION: Successful initiation of BILATERAL catheter-directed pulmonary arterial lysis for submassive pulmonary emboli, from a RIGHT jugular approach. PLAN: Lytic catheter infusion rates as described per order set. Vascular Interventional Radiology will follow the patient with anticipated catheter removal during morning rounds. Anita Hall, MD [facility]  [facility] Electronically Signed   By: Anita Aguirre M.D.   On: 10/21/2023 11:52   IR Angiogram Selective Each Additional Vessel Result Date: 10/21/2023 INDICATION: Submassive PE. RV/LV ratio 1.4. Failed conservative management, with hypoxemia requiring ECMO cannulation. EXAM: Title; BILATERAL PULMONARY ARTERIAL LYTIC CATHETER PLACEMENT Listed procedures; 1. ULTRASOUND GUIDANCE FOR VENOUS ACCESS X2 2. PULMONARY ARTERIOGRAPHY 3. PLACEMENT OF BILATERAL PULMONARY ARTERIAL LYTIC INFUSION CATHETERS COMPARISON:  CT AP, 10/19/2023 MEDICATIONS: None ANESTHESIA/SEDATION: Sedation by the ICU team was performed. Please see ICU RN log for details. CONTRAST:  50 mL Omnipaque 300 FLUOROSCOPY: Radiation Exposure Index and estimated peak skin dose (PSD); Reference air kerma (RAK), 100.9 mGy. Kerma-area product (KAP), 2394.8 uGy*m. COMPLICATIONS: None immediate. TECHNIQUE: Informed written consent was obtained from the patient and/or patient's representative after a discussion of the risks, benefits and alternatives to treatment. Questions regarding the procedure were encouraged and answered. A timeout was performed prior to the initiation of the procedure. The patient had been recently cannulated for ECMO. As such, the RIGHT internal jugular vein was selected for vascular access. The RIGHT neck was prepped and draped in the usual sterile fashion, and a sterile drape was applied covering the operative field. Maximum barrier sterile technique with sterile gowns and gloves were used for the procedure. A timeout was performed prior to the initiation of the procedure. Local anesthesia was provided with 1% lidocaine. Under direct ultrasound guidance, the RIGHT internal jugular vein was accessed with a micro puncture kit ultimately allowing placement of a 6 Fr 10 cm vascular sheath. Slightly cranial to this initial access, the RIGHT internal jugular vein was again accessed with an additional micropuncture kit ultimately  allowing placement of an additional 6 Fr 10 cm vascular sheath. Ultrasound images were saved for procedural documentation purposes. With the use of a Bentson wire, a C2 catheter was advanced into the main pulmonary artery and a limited central pulmonary arteriogram was performed. Pressure measurements were then obtained from the main pulmonary artery. The catheter was advanced into the distal branch of the LEFT lower lobe pulmonary artery. Limited contrast injection confirmed appropriate positioning. Over an exchange length stiff Glidewire, the catheter was exchanged for a 90/10 cm multi side-hole infusion catheter. The procedure was then repeated on the patient's RIGHT pulmonary artery, and with the use of a stiff Rosen wire, access was obtained into a distal branch of the RIGHT  lower lobe pulmonary artery allowing placement of a 90/10 cm multi side-hole infusion catheter. A postprocedural fluoroscopic image was obtained to document final catheter positioning. The external catheter tubing was secured at the RIGHT neck and the lytic therapy was initiated. The patient tolerated the procedure well without immediate postprocedural complication. FINDINGS: Central pulmonary arteriogram demonstrates pulmonary emboli at the BILATERAL inferior lobar segmental pulmonary arteries. Acquired pressure measurements: Main  pulmonary artery; 58/55, mean 34 mmHg (normal: < 25/10) Following the procedure, both infusion catheter tips terminate within the distal LEFT and RIGHT pulmonary arteries. IMPRESSION: Successful initiation of BILATERAL catheter-directed pulmonary arterial lysis for submassive pulmonary emboli, from a RIGHT jugular approach. PLAN: Lytic catheter infusion rates as described per order set. Vascular Interventional Radiology will follow the patient with anticipated catheter removal during morning rounds. Anita Hall, MD [facility] Electronically Signed   By:  Anita [facility] M.D.   On: 10/21/2023 11:52   DG CHEST PORT 1 VIEW Result Date: 10/21/2023 CLINICAL DATA:  [id number]. Patient receiving ECMO, history of acute pulmonary embolism with cor pulmonale. EXAM: PORTABLE CHEST 1 VIEW COMPARISON:  Portable chest yesterday at 6:27 a.m. FINDINGS: 5:40 a.m. interval intubation, ETT tip 2.9 cm from the carina. NGT enters stomach the intragastric course out of view. There is a left IJ Swan-Ganz catheter through an introducer sheath, the tip is in the pulmonary outflow tract. There are additional right IJ pulmonary artery catheters extending into the right and left lower lobe pulmonary arteries. The lungs hypoexpanded but clear with again note of elevated right hemidiaphragm. There is stable cardiomegaly and prominent central pulmonary arteries. No vascular congestion is seen. There is no substantial pleural effusion. The mediastinum is stable. No new osseous abnormality. IMPRESSION: 1. Interval intubation, ETT tip 2.9 cm from the carina. 2. NGT enters stomach the intragastric course out of view. 3. Left IJ Swan-Ganz catheter tip in the pulmonary outflow tract. 4. Additional right IJ pulmonary artery catheters extending into the right and left lower lobe pulmonary arteries. 5. Stable cardiomegaly and prominent central pulmonary arteries. 6. No new chest findings. Electronically Signed   By: Francis Quam M.D.   On: 10/21/2023 07:21   CT CHEST ABDOMEN PELVIS W CONTRAST Result Date: 10/20/2023 CLINICAL DATA:  The-a ECMO, bleeding EXAM: CT CHEST, ABDOMEN, AND PELVIS WITH CONTRAST TECHNIQUE: Multidetector CT imaging of the chest, abdomen and pelvis was performed following the standard protocol during bolus administration of intravenous contrast. RADIATION DOSE REDUCTION: This exam was performed according to the departmental dose-optimization program which includes automated exposure control, adjustment of the mA and/or kV according to patient size and/or use of iterative  reconstruction technique. CONTRAST:  75mL OMNIPAQUE IOHEXOL 350 MG/ML SOLN COMPARISON:  Chest radiograph 10/20/2023; CTA chest 10/19/2023; CT abdomen pelvis 05/19/2012 FINDINGS: CT CHEST FINDINGS Cardiovascular: Normal heart size. No pericardial effusion. Right IJ CVC tip in the right atrium. Left IJ Swan-Ganz catheter tip in the proximal right pulmonary artery. Normal caliber throat thoracic aorta without dissection. Filling defects in the proximal segmental branches of the bilateral upper and lower lobe pulmonary arteries are redemonstrated and grossly similar to 10/19/2023. These are suboptimally evaluated on the current exam due to contrast timing is. There is again evidence of right heart strain (RV/LV ratio 1.5). Mediastinum/Nodes: Endotracheal tube tip in the intrathoracic trachea. Esophagus is unremarkable. No thoracic adenopathy. Lungs/Pleura: Patchy airspace opacities in the lingula and bilateral lower lobes compatible with aspiration, atelectasis, or pneumonia. Small right pleural effusion. No pneumothorax. Musculoskeletal: No  acute fracture. CT ABDOMEN PELVIS FINDINGS Hepatobiliary: Cholelithiasis. No evidence of acute cholecystitis. Unremarkable liver and biliary tree. Pancreas: Unremarkable. Spleen: Unremarkable. Adrenals/Urinary Tract: Normal adrenal glands. No urinary calculi or hydronephrosis. Nondistended bladder about a Foley catheter. Gas within the bladder. Stomach/Bowel: Normal caliber large and small bowel. No bowel wall thickening. Stomach and appendix are within normal limits. Vascular/Lymphatic: ECMO cannulae with the veinous tip at the inferior cavoatrial junction in the arterial tip in the left common iliac artery. No lymphadenopathy. Reproductive: Marked irregular enlargement of the uterus. This is suboptimally evaluated with CT. On ultrasound 10/07/2021 several large intramural fibroids were noted which may account for the CT findings. No adnexal mass. Other: No free intraperitoneal  fluid or air. Musculoskeletal: No acute fracture. IMPRESSION: 1. Grossly similar bilateral pulmonary emboli with evidence of right heart strain compared with CT 10/19/2023. (RV/LV ratio 1.5). 2. Nonspecific patchy airspace opacities in the lingula and bilateral lower lobes. Differential considerations include infarct, atelectasis, or pneumonia. 3. Small right pleural effusion. 4. Marked irregular enlargement of the uterus. This is suboptimally evaluated with CT. On ultrasound 10/07/2021 several large intramural fibroids were noted which may account for the CT findings. 5. Cholelithiasis.  No evidence of acute cholecystitis. 6. Lines and tubes as described. Electronically Signed   By: Norman Gatlin M.D.   On: 10/20/2023 20:54   CARDIAC CATHETERIZATION Result Date: 10/20/2023 Hemodynamics on V-A ECMO & milrinone 0.375mcg/kg/min: RA: 6 PA: 34/17 (22) PW: 15 Successful cannulation for V-A ECMO with right femoral 25Fr venous drain and 19Fr left femoral artery return cannula. Placement of left SFA DPC. Right heart catheterization with left internal jugular PA catheter sutured into place.   ECHOCARDIOGRAM COMPLETE Result Date: 10/20/2023    ECHOCARDIOGRAM REPORT   [name] Date of Exam: 10/20/2023 Medical Rec #:  [id number]     Height:       67.0 in Accession #:    [id number]    Weight:       234.1 lb Date of Birth:  10-11-1975     BSA:          2.162 m Patient Age:    47 years      BP:           107/71 mmHg Patient Gender: F             HR:           107 bpm. Exam Location:  Inpatient Procedure: 2D Echo (Both Spectral and Color Flow Doppler were utilized during            procedure). STAT ECHO Indications:    pulmonary embolus  History:        Patient has no prior history of Echocardiogram examinations.                 Signs/Symptoms:Shortness of Breath; Risk Factors:Hypertension.  Sonographer:    Tinnie Barefoot RDCS Referring Phys: [id number] DLIYJF CHAND  Sonographer Comments: Suboptimal  subcostal window. Image acquisition challenging due to patient body habitus. IMPRESSIONS  1. Findings are consistent with acute cor pulmonale in this patient with known submassive PE. Right ventricular systolic function is moderately reduced. The right ventricular size is severely enlarged.  2. Left ventricular ejection fraction, by estimation, is 65 to 70%. The left ventricle has normal function. The left ventricle has no regional wall motion abnormalities. Indeterminate diastolic filling due to E-A fusion. There is the interventricular septum is flattened in systole and diastole, consistent with  right ventricular pressure and volume overload.  3. The mitral valve is grossly normal. No evidence of mitral valve regurgitation. No evidence of mitral stenosis.  4. The aortic valve is tricuspid. Aortic valve regurgitation is not visualized. No aortic stenosis is present. Conclusion(s)/Recommendation(s): Findings consistent with Cor Pulmonale. FINDINGS  Left Ventricle: Left ventricular ejection fraction, by estimation, is 65 to 70%. The left ventricle has normal function. The left ventricle has no regional wall motion abnormalities. The left ventricular internal cavity size was small. There is no left ventricular hypertrophy. The interventricular septum is flattened in systole and diastole, consistent with right ventricular pressure and volume overload. Indeterminate diastolic filling due to E-A fusion. Right Ventricle: Findings are consistent with acute cor pulmonale in this patient with known submassive PE. The right ventricular size is severely enlarged. No increase in right ventricular wall thickness. Right ventricular systolic function is moderately reduced. Left Atrium: Left atrial size was normal in size. Right Atrium: Right atrial size was normal in size. Pericardium: There is no evidence of pericardial effusion. Mitral Valve: The mitral valve is grossly normal. No evidence of mitral valve regurgitation. No  evidence of mitral valve stenosis. Tricuspid Valve: The tricuspid valve is grossly normal. Tricuspid valve regurgitation is mild . No evidence of tricuspid stenosis. Aortic Valve: The aortic valve is tricuspid. Aortic valve regurgitation is not visualized. No aortic stenosis is present. Pulmonic Valve: The pulmonic valve was grossly normal. Pulmonic valve regurgitation is trivial. No evidence of pulmonic stenosis. Aorta: The aortic root and ascending aorta are structurally normal, with no evidence of dilitation. Venous: The inferior vena cava was not well visualized. IAS/Shunts: The atrial septum is grossly normal.  LEFT VENTRICLE PLAX 2D LVIDd:         4.70 cm   Diastology LVIDs:         2.90 cm   LV e' medial:  9.03 cm/s LV PW:         0.90 cm   LV e' lateral: 10.20 cm/s LV IVS:        0.90 cm LVOT diam:     2.30 cm LV SV:         65 LV SV Index:   30 LVOT Area:     4.15 cm  RIGHT VENTRICLE RV Basal diam:  3.20 cm RV S prime:     22.30 cm/s TAPSE (M-mode): 1.9 cm LEFT ATRIUM             Index        RIGHT ATRIUM           Index LA diam:        3.30 cm 1.53 cm/m   RA Area:     15.20 cm LA Vol (A2C):   34.3 ml 15.86 ml/m  RA Volume:   37.70 ml  17.44 ml/m LA Vol (A4C):   30.3 ml 14.01 ml/m LA Biplane Vol: 34.5 ml 15.96 ml/m  AORTIC VALVE LVOT Vmax:   128.00 cm/s LVOT Vmean:  83.300 cm/s LVOT VTI:    0.157 m  AORTA Ao Root diam: 3.60 cm Ao Asc diam:  3.80 cm TRICUSPID VALVE TR Peak grad:   51.3 mmHg TR Vmax:        358.00 cm/s  SHUNTS Systemic VTI:  0.16 m Systemic Diam: 2.30 cm Darryle Decent MD Electronically signed by Darryle Decent MD Signature Date/Time: 10/20/2023/4:07:32 PM    Final    DG CHEST PORT 1 VIEW Result Date: 10/20/2023 CLINICAL DATA:  [id number].  Encounter for central line placement. EXAM: PORTABLE CHEST 1 VIEW COMPARISON:  AP and lateral chest yesterday at 4:08 p.m. FINDINGS: 6:27 a.m. There is a right IJ central line interval insertion with tip in the mid to lower right atrium. There is no  pneumothorax. The lungs are expiratory with bronchovascular crowding, without evidence of a focal pneumonia. The mediastinum is stable allowing for expiration. Small hiatal hernia. Mild cardiomegaly. No vascular congestion is seen.  No new osseous findings. IMPRESSION: 1. Right IJ central line tip in the mid to lower right atrium. No pneumothorax. 2. Expiratory chest with bronchovascular crowding. 3. Small hiatal hernia. Electronically Signed   By: Francis Quam M.D.   On: 10/20/2023 06:37   CT Angio Chest PE W/Cm &/Or Wo Cm Result Date: 10/19/2023 CLINICAL DATA:  Short of breath.  Concern for pulmonary embolism. EXAM: CT ANGIOGRAPHY CHEST WITH CONTRAST TECHNIQUE: Multidetector CT imaging of the chest was performed using the standard protocol during bolus administration of intravenous contrast. Multiplanar CT image reconstructions and MIPs were obtained to evaluate the vascular anatomy. RADIATION DOSE REDUCTION: This exam was performed according to the departmental dose-optimization program which includes automated exposure control, adjustment of the mA and/or kV according to patient size and/or use of iterative reconstruction technique. CONTRAST:  75mL OMNIPAQUE IOHEXOL 350 MG/ML SOLN COMPARISON:  None Available. FINDINGS: Cardiovascular: There are filling defects within the proximal LEFT lower lobe pulmonary arteries. Filling defects are occlusive and partially occlusive. filling defects within the lingular pulmonary artery. Within the RIGHT lung, filling defects in the proximal segmental branches of the RIGHT lobe pulmonary artery and RIGHT upper lobe pulmonary artery Overall clot burden is severe. CT evidence of RIGHT ventricular strain with RIGHT ventricle to LEFT ventricle ratio equal 5.0:3.4 equal 1.4 Mediastinum/Nodes: No axillary or supraclavicular adenopathy. No mediastinal or hilar adenopathy. No pericardial fluid. Esophagus normal. Moderate hiatal hernia. Lungs/Pleura: Hazy nodule in the RIGHT lower  lobe measuring 16 mm may represent small infarction. No large pulmonary infarction. Upper Abdomen: Limited view of the liver, kidneys, pancreas are unremarkable. Normal adrenal glands. Musculoskeletal: No aggressive osseous lesion. Review of the MIP images confirms the above findings. IMPRESSION: 1. Bilateral pulmonary emboli with severe clot burden. 2. CT evidence of RIGHT ventricular strain. Positive for acute PE with CT evidence of right heart strain (RV/LV Ratio = 1.4) consistent with at least submassive (intermediate risk) PE. The presence of right heart strain has been associated with an increased risk of morbidity and mortality. Please refer to the Code PE Focused order set in EPIC. 3. Small pulmonary infarction in the RIGHT lower lobe. 4. Moderate hiatal hernia. Critical Value/emergent results were called by telephone at the time of interpretation on 10/19/2023 at 6:06 pm to provider [facility] , who verbally acknowledged these results. Electronically Signed   By: Jackquline Boxer M.D.   On: 10/19/2023 18:07   DG Chest 2 View Result Date: 10/19/2023 CLINICAL DATA:  sob EXAM: CHEST - 2 VIEW COMPARISON:  October 07, 2021 FINDINGS: Lower lung volumes with elevation of the right hemidiaphragm. No focal airspace consolidation, pleural effusion, or pneumothorax. No cardiomegaly.No acute fracture or destructive lesion. IMPRESSION: No acute cardiopulmonary abnormality. Electronically Signed   By: Rogelia Myers M.D.   On: 10/19/2023 16:11    Labs:  CBC: Recent Labs    10/19/23 1559 10/19/23 1856 10/20/23 9356 10/20/23 1525 10/20/23 2344 10/21/23 0055 10/21/23 0508 10/21/23 0754  WBC 15.2*  --  18.0*  --  12.4*  --  10.9*  --  HGB 9.8*   < > 8.4*   < > 9.0* 8.5* 8.9*  8.5* 8.8*  HCT 30.9*   < > 27.3*   < > 28.3* 25.0* 28.3*  25.0* 26.0*  PLT 466*  --  426*  --  249  --  233  --    < > = values in this interval not displayed.    COAGS: Recent Labs    10/19/23 1710 10/20/23 2344   INR 1.0 1.3*  APTT 27  --     BMP: Recent Labs    10/20/23 0643 10/20/23 1525 10/20/23 1534 10/20/23 1928 10/20/23 2325 10/20/23 2344 10/21/23 0055 10/21/23 0508 10/21/23 0754  NA 138 135   < > 139  139   < > 138 141 139  141 141  K 3.3* 3.4*   < > 3.7  3.7   < > 3.2* 3.5 4.5  4.6 4.4  CL 106 101  --  102  --  110  --  113*  --   CO2 20* 18*  --   --   --  19*  --  16*  --   GLUCOSE 140* 136*  --  169*  --  131*  --  119*  --   BUN 14 14  --  13  --  12  --  11  --   CALCIUM 8.6* 8.5*  --   --   --  7.6*  --  7.3*  --   CREATININE 1.09* 1.22*  --  1.20*  --  1.16*  --  1.05*  --   GFRNONAA >60 55*  --   --   --  58*  --  >60  --    < > = values in this interval not displayed.    LIVER FUNCTION TESTS: Recent Labs    10/19/23 1559 10/20/23 1525 10/20/23 2344  BILITOT 0.4  --  1.2  AST 28  --  18  ALT 12  --  9  ALKPHOS 22*  --  14*  PROT 7.3  --  5.1*  ALBUMIN 3.3* 3.1* 2.8*    Assessment and Plan:  48 y.o. female with submassive PE, vaginal bleeding, hypotension requiring ECMO, s/p PE lysis and [place] by Dr. Hughes on 6/28.   Remains intubated and sedated  PE lysis catheter will be removed by IR team  Labs stable  R DP not palpable but feet warm and has good perfusion   Further treatment plan per cardiology/PCCM  Appreciate and agree with the plan.  Please call IR for questions and concerns.    Electronically Signed: Toya VEAR Cousin, PA-C 10/21/2023, 12:25 PM   I spent a total of 15 Minutes at the the patient's bedside AND on the patient's [facility] floor or unit, greater than 50% of which was counseling/coordinating care for PE lysis and [place]OM[place] f/u.   This chart was dictated using voice recognition software.  Despite best efforts to proofread,  errors can occur which can change the documentation meaning.

## 2023-10-21 NOTE — Progress Notes (Signed)
 Patient received from IR, no acute complications noted. Power supply into red outlet and oxygen gas supply is connected to wall source from the portable tank. ECMO patency assess no concerning issues at this time. Detailed report received from [place], [place]. Will monitor patient clinical presentation, respiratory and hemodynamic status throughout the night.   Cadince Hilscher L. Claudene, BS, RRT-ACCS, RCP, CES-A

## 2023-10-21 NOTE — H&P (Signed)
 NAME:  Anita Aguirre, MRN:  [id number], DOB:  1975/06/14, LOS: 1 ADMISSION DATE:  10/20/2023, CONSULTATION DATE:  10/20/2023 REFERRING MD:  Dr Isadora, CHIEF COMPLAINT:  SOB/Chest pain  History of Present Illness:  48 y/o female with PMH for ADD (attention deficit disorder), Arthritis (04/24/2008), Diffuse cystic mastopathy (04/24/2010), Hypertension, Hypothyroidism,Mastitis and bleeding uterine fibroids who presented to [facility] with worsening SOB and chest pain for last 2 days worsening day of admission to hospital  Chest pain dull and not radiating.  She was found to have submassive Pulmonary embolism with right heart strain.  Her Troponin increased to 409, Lactic Acid down to 2.7 from 3.9. CT scan chest revealed, Bilateral pulmonary emboli with severe clot burden. 2. CT evidence of RIGHT ventricular strain. Positive for acute PE with CT evidence of right heart strain (RV/LV Ratio = 1.4) consistent with at least submassive (intermediate risk) PE. The presence of right heart strain has been associated with an increased risk of morbidity and mortality. Please refer to the Code PE Focused order set in EPIC. 3. Small pulmonary infarction in the RIGHT lower lobe. 4. Moderate hiatal hernia.  Patient says she has no previous h/o blood clots but does have a h/o spontaneous abortion.  She works from home and has a sit down job and her life style is very sedentary.  No family h/o Pulmonary emboli. At [facility] she was started on Heparin drip and Milrinone drip.  She is feeling better, currently not on pressors.  Case was d/w IR by Dr Isadora and she was not a good candidate for clot extraction but maybe a better case for catheter directed Thrombolysis despite have continually bleeding for several years, She says she has been having uterine bleeding since 2013 when she had a miscarriage.  Her uterine bleeding apparently from Fibroids has worsened over the last several months and she was being  considered for Hysterectomy at [facility] but has not made it there yet. No N/V/D, no Fever/chills, no recent flights or leg trauma.  She did c/o left calf pain over the last few days and increase size of left ankle. Denies LOC. Pertinent  Medical History  ADD (attention deficit disorder), Arthritis (04/24/2008), Diffuse cystic mastopathy (04/24/2010), Hypertension, Hypothyroidism, and Mastitis.   Significant Hospital Events: Including procedures, antibiotic start and stop dates in addition to other pertinent events   6/28: transfer from [place].  Underwent VA ECMO and catheter directed thrombolysis  Interim History / Subjective:  No overnight issues Vasopressors titrated off Remain on VA ECMO Still receiving catheter directed tPA  Objective    Blood pressure (!) 118/92, pulse 61, temperature 98.1 F (36.7 C), resp. rate 16, weight 111.4 kg, SpO2 95%. PAP: (-1-59)/(-10-25) 23/14 CVP:  [7 mmHg-27 mmHg] 13 mmHg  Vent Mode: PRVC FiO2 (%):  [45 %-100 %] 45 % Set Rate:  [16 bmp] 16 bmp Vt Set:  [450 mL-490 mL] 490 mL PEEP:  [5 cmH20-10 cmH20] 5 cmH20 Plateau Pressure:  [20 cmH20-21 cmH20] 21 cmH20   Intake/Output Summary (Last 24 hours) at 10/21/2023 1026 Last data filed at 10/21/2023 1023 Gross per 24 hour  Intake 2258.26 ml  Output 3050 ml  Net -791.74 ml   Filed Weights   10/20/23 0320 10/21/23 0500  Weight: 106.2 kg 111.4 kg    Examination: General: Crtitically ill-appearing obese female, orally intubated HEENT: [place]/AT, eyes anicteric.  ETT and cortrak in place Neuro: Sedated, not following commands.  Eyes are closed.  Pupils 3 mm bilateral reactive to  light Chest: Coarse breath sounds, no wheezes or rhonchi Heart: Regular rate and rhythm, no murmurs or gallops Abdomen: Soft, nondistended, bowel sounds present  Labs and images reviewed  Patient Lines/Drains/Airways Status     Active Line/Drains/Airways     Name Placement date Placement time Site Days   Arterial Line  10/20/23 Right Radial 10/20/23  1824  Radial  1   Peripheral IV 10/19/23 20 G Right Antecubital 10/19/23  1636  Antecubital  2   CVC Triple Lumen 10/20/23 Right Internal jugular 10/20/23  0600  -- 1   Peripheral IV (Ped) 10/19/23 20 G Hand 10/19/23  1637  -- 2   Sheath 10/20/23 Right Internal Jugular;Venous 10/20/23  2056  Internal Jugular;Venous  1   Sheath 10/20/23 Right Venous 10/20/23  2059  Venous  1   ECMO Drainage Single Lumen Cannula 10/20/23  1816  -- 1   ECMO Return Single Lumen Cannula 10/20/23  1816  -- 1   NG/OG Vented/Dual Lumen 14 Fr. Oral Marking at nare/corner of mouth 62 cm 10/21/23  0130  Oral  less than 1   Urethral Catheter Lauraine Letters, RN Temperature probe 14 Fr. 10/19/23  2230  Temperature probe  2   Airway 7.5 mm 10/20/23  1804  -- 1   Pulmonary Artery Catheter Left --  --  -- --   Pulmonary Artery Catheter 10/20/23 Right 10/20/23  2109  -- 1   Pulmonary Artery Catheter 10/20/23 Left 10/20/23  2123  -- 1        Resolved problem list  Lactic acidosis Assessment and Plan  Acute bilateral submassive PE with right heart strain receiving catheter directed thrombolysis Acute RV failure with cardiogenic shock status post VA ECMO on 6/28 Acute respiratory failure with hypoxia Right upper lobe lung infarction Vaginal bleeding status post uterine artery embolization Acute blood loss anemia from ECMO cannulation and vaginal bleeding Hypothyroidism Obesity Hypokalemia SIRS response from ECMO cannulation, improved Acute kidney injury due to cardiorenal syndrome, improving  Patient is still receiving catheter directed thrombolysis Start bivalirudin with PTT goal 50-70 Remain on VA ECMO with flow 4.7 L Advanced heart failure team is following Remain off vasopressor support and inotropes Continue lung protective ventilation FiO2 was titrated down to 45% PEEP remain at 5 Continue to have some clots per vagina Underwent uterine artery embolization Received 3 units  PRBCs yesterday, monitor H&H to keep hemoglobin 8-9 Continue levothyroxine Electrolytes are being corrected SARS response has resolved Serum creatinine continue to improve Diet and exercise counseling when appropriate  Best Practice (right click and Reselect all SmartList Selections daily)   Diet/type: NPO w/ oral meds start tube feeds DVT prophylaxis Bival Pressure ulcer(s): Deferred to nursing notes GI prophylaxis: H2 blocker Lines: Central line, arterial line and ECMO cannulation, still needed Foley: Yes, still needed Code Status:  full code. Last date of goals of care discussion: 6/28 patient and her mother were updated at bedside, decision was to continue full scope of care   Labs   CBC: Recent Labs  Lab 10/19/23 1559 10/19/23 1856 10/20/23 [id number] 10/20/23 1525 10/20/23 2325 10/20/23 2344 10/21/23 0055 10/21/23 0508 10/21/23 0754  WBC 15.2*  --  18.0*  --   --  12.4*  --  10.9*  --   HGB 9.8*   < > 8.4*   < > 8.5* 9.0* 8.5* 8.9*  8.5* 8.8*  HCT 30.9*   < > 27.3*   < > 25.0* 28.3* 25.0* 28.3*  25.0* 26.0*  MCV  82.2  --  85.6  --   --  82.3  --  81.6  --   PLT 466*  --  426*  --   --  249  --  233  --    < > = values in this interval not displayed.    Basic Metabolic Panel: Recent Labs  Lab 10/19/23 1559 10/20/23 9356 10/20/23 1525 10/20/23 1534 10/20/23 1928 10/20/23 2325 10/20/23 2344 10/21/23 0055 10/21/23 0508 10/21/23 0754  NA 140 138 135   < > 139  139 140 138 141 139  141 141  K 3.4* 3.3* 3.4*   < > 3.7  3.7 3.3* 3.2* 3.5 4.5  4.6 4.4  CL 109 106 101  --  102  --  110  --  113*  --   CO2 21* 20* 18*  --   --   --  19*  --  16*  --   GLUCOSE 109* 140* 136*  --  169*  --  131*  --  119*  --   BUN 9 14 14   --  13  --  12  --  11  --   CREATININE 0.90 1.09* 1.22*  --  1.20*  --  1.16*  --  1.05*  --   CALCIUM 9.2 8.6* 8.5*  --   --   --  7.6*  --  7.3*  --   MG  --  1.9  --   --   --   --   --   --  1.8  --   PHOS  --  4.9* 3.9  --   --   --    --   --   --   --    < > = values in this interval not displayed.   GFR: Estimated Creatinine Clearance: 84.3 mL/min (A) (by C-G formula based on SCr of 1.05 mg/dL (H)). Recent Labs  Lab 10/19/23 1559 10/19/23 1639 10/20/23 0643 10/20/23 1539 10/20/23 1928 10/20/23 2344 10/21/23 0508  PROCALCITON <0.10  --   --   --   --   --   --   WBC 15.2*  --  18.0*  --   --  12.4* 10.9*  LATICACIDVEN  --    < > 1.8 2.2* 1.9 2.8* 1.2   < > = values in this interval not displayed.    Liver Function Tests: Recent Labs  Lab 10/19/23 1559 10/20/23 1525 10/20/23 2344  AST 28  --  18  ALT 12  --  9  ALKPHOS 22*  --  14*  BILITOT 0.4  --  1.2  PROT 7.3  --  5.1*  ALBUMIN 3.3* 3.1* 2.8*   No results for input(s): LIPASE, AMYLASE in the last 168 hours. No results for input(s): AMMONIA in the last 168 hours.  ABG    Component Value Date/Time   PHART 7.363 10/21/2023 0754   PCO2ART 31.6 (L) 10/21/2023 0754   PO2ART 95 10/21/2023 0754   HCO3 18.0 (L) 10/21/2023 0754   TCO2 19 (L) 10/21/2023 0754   ACIDBASEDEF 7.0 (H) 10/21/2023 0754   O2SAT 97 10/21/2023 0754     Coagulation Profile: Recent Labs  Lab 10/19/23 1710 10/20/23 2344  INR 1.0 1.3*    Cardiac Enzymes: No results for input(s): CKTOTAL, CKMB, CKMBINDEX, TROPONINI in the last 168 hours.  HbA1C: Hgb A1c MFr Bld  Date/Time Value Ref Range Status  10/20/2023 06:43 AM 5.0 4.8 - 5.6 % Final  Comment:    (NOTE) Diagnosis of Diabetes The following HbA1c ranges recommended by the American Diabetes Association (ADA) may be used as an aid in the diagnosis of diabetes mellitus.  Hemoglobin             Suggested A1C NGSP%              Diagnosis  <5.7                   Non Diabetic  5.7-6.4                Pre-Diabetic  >6.4                   Diabetic  <7.0                   Glycemic control for                       adults with diabetes.      CBG: Recent Labs  Lab 10/20/23 1540 10/20/23 2255  10/21/23 0058 10/21/23 0506 10/21/23 0751  GLUCAP 125* 130* 114* 120* 117*    The patient is critically ill due to acute bilateral submassive PE/acute respiratory failure/cardiogenic shock status post VA ECMO.  Critical care was necessary to treat or prevent imminent or life-threatening deterioration.  Critical care was time spent personally by me on the following activities: development of treatment plan with patient and/or surrogate as well as nursing, discussions with consultants, evaluation of patient's response to treatment, examination of patient, obtaining history from patient or surrogate, ordering and performing treatments and interventions, ordering and review of laboratory studies, ordering and review of radiographic studies, pulse oximetry, re-evaluation of patient's condition and participation in multidisciplinary rounds.   During this encounter critical care time was devoted to patient care services described in this note for 44 minutes.     Valinda Novas, MD [facility] See Amion for pager If no response to pager, please call [phone] until 7pm After 7pm, Please call E-link [phone]

## 2023-10-21 NOTE — Progress Notes (Signed)
 Initial Nutrition Assessment  DOCUMENTATION CODES:   Not applicable  INTERVENTION:  Reverse Trendelenburg >10 degrees while TF infusing, HOB >30 when able to be maintained  Initiate tube feeding via OGT: Vital 1.5 at 50 ml/h (1200 ml per day) Initiate at 20ml/hr and increase by 10ml/hr q8 hours until goal rate achieved Prosource TF20 60 ml BID  Provides 1960 kcal, 121 gm protein, 917 ml free water daily   Monitor magnesium, potassium, and phosphorus BID for at least 3 days  Add Thiamine 100 mg daily for 7 days    NUTRITION DIAGNOSIS:  Inadequate oral intake related to inability to eat as evidenced by NPO status.  GOAL:  Patient will meet greater than or equal to 90% of their needs  MONITOR:  TF tolerance, Skin, Diet advancement, Labs, I & O's  REASON FOR ASSESSMENT:   Consult Enteral/tube feeding initiation and management  ASSESSMENT:   Pt with PMH significant for: ADD, arthritis, HTN, hypothyroidism, cystic mastopathy, mastitis and bleeding of uterine fibroids. Presented to [facility] with c/o worsening SOB and chest pain x2 days. Found to have pulmonary embolism w/ R heart strain. Not a good candidate for clot extraction. Transferred to [facility] and underwent VA ECMO and catheter directed thrombolysis.  6/28 transferred to [facility], 3 units PRBCs transfused; ECMO initiated 6/29 intubated; TFs initiated  She remains sedated on VA ECMO and off pressors. Still receiving catheter directed tPA. Continues on lung protective ventilation. Did receive dose of methylene blue, bicarb, and Solu-Cortef.  FiO2 45% PEEP 5 CVP 13  Patient is currently intubated on ventilator support MV: 7.5 L/min Temp (24hrs), Avg:98.7 F (37.1 C), Min:97.3 F (36.3 C), Max:100.4 F (38 C)  MAP (a-line): ; diastolics in the 70s  Attempted to call patient's mother to assess intake prior to admission with no success. Will assume refeeding risk. Electrolytes being monitored.  Admit weight: 106.2kg   Current weight: 111.4kg +mild pitting edema (generalized)  Unable to obtain patient's reported UBW. Per chart review, no recent weight trend to review. Most recent weight was from two years ago. Generalized mild pitting edema noted. Trend up since admission likely 2/2 med and fluid administration.  Intake/Output Summary (Last 24 hours) at 10/21/2023 1200 Last data filed at 10/21/2023 1100 Gross per 24 hour  Intake 2270.39 ml  Output 2680 ml  Net -409.61 ml   Net IO Since Admission: -710.92 mL [10/21/23 1200]  Drains/Lines: OGT(gastric) placed 6/29 RIJ: CVC triple lumen R radial: a-line Foley catheter UOP: 2.8L x24 hours  Meds: docusate, famotidine, SSI Novolog, Miralax, IV ABX Drips: Fentanyl  Versed 10 mEq KCl x4 4g Mg sulfate x1  Labs Reviewed: K+ 4.4 PHOS 3.9 Mg 1.8 WBC 18>12.4>10.9 (H) CBG ranges from 119-131 mg/dL over the last 24 hours HgbA1c 5.0 (09/2023)   NUTRITION - FOCUSED PHYSICAL EXAM: Will defer to in-person assessment  Diet Order:   Diet Order     None      EDUCATION NEEDS:   Not appropriate for education at this time  Skin:  Skin Assessment: Reviewed RN Assessment  Last BM:  6/27  Height:  Ht Readings from Last 1 Encounters:  10/19/23 5' 7 (1.702 m)   Weight:  Wt Readings from Last 1 Encounters:  10/21/23 111.4 kg   Ideal Body Weight:  61.4 kg  BMI:  Body mass index is 38.47 kg/m.  Estimated Nutritional Needs:   Kcal:  1900-2100 kcals  Protein:  120-140g  Fluid:  >1.9L/day  Blair Deaner MS, RD, LDN Registered Dietitian  Clinical Nutrition RD Inpatient Contact Info in Amion

## 2023-10-21 NOTE — Progress Notes (Signed)
 After cannulation for ECMO, patient with severe vasoplegic shock. Cannulas were re-assessed. Hgb, lactic acid stable. Levophed increased to 25mcg, vasopressin 0.04. Milrinone was discontinued. Patient was taken emergently to CT C/A/P. I reviewed her imaging with IR. There was no bleeding; cannulas appropriately positioned. After discussion with pharmD patient administered methylene blue and hydrocortisone 100mg  with improvement in MAP. Levophed was quickly weaned off. I remained with the patient through transport, CT & until hemodynamics were stabilized.   CC time:  Rhianna Raulerson 10:16 AM

## 2023-10-21 NOTE — Consult Note (Signed)
   ADVANCED HEART FAILURE CONSULT NOTE  Referring Physician: Isadora Hose, MD  Primary Care: Pcp, No Heart Failure: Thayne Cindric, DO  CC: SCAI D Cardiogenic shock 2/2 RVF (Acute pulmonary embolism)  Interval hx:  - 2.8L urine output, negative 700cc.  - PA 20s/10, CVP 13.   PHYSICAL EXAM: Vitals:   10/21/23 0930 10/21/23 0945  BP:    Pulse: 62 60  Resp: 16 16  Temp: 98.1 F (36.7 C) 98.1 F (36.7 C)  SpO2: 96% 96%   Lungs- decreased BS CARDIAC: right internal jugular catheters in place; left PA catheter in place         Normal rate with regular rhythm. no murmur.  Pulses 1+. 1-2+ edema.  ABDOMEN: soft EXTREMITIES: warm to touch  DATA REVIEW  ECG: 10/20/23: sinus tachycardia as per my personal interpretation  ECHO: 10/20/23: Normal LV function; severely reduced RV function.   ASSESSMENT & PLAN:  SCAI D SHOCK 2/2 PE & RV Failure - CT chest with on 10/19/23 with bilateral pulmonary emboli with severe clot burden; clot is in the segmental and distal branches, not amenable to thrombectomy.  - Cannulated for V-A ECMO fem-fem on 10/20/23 for RV failure.  - Right internal jugular catheters in place for thrombolysis to the right and left PA - s/p uterine artery embolization on 10/20/23.  - Overnight, pressors weaned off; lactic acid wnl. CXR stable. Hgb stable.  - Start bival today. Discussed with pharmD.  - Continue empiric antibiotics.  - TTE tomorrow to re-assess RV function.  - Start IV lasix gtt at 4mg /hr.  - Hopeful for decannulation or transition to RV support in 48-72H.   2. Acute pulmonary embolism  - Bilateral pulmonary emboli; currently receiving 1mg  TPA to each PA. IR following.   3. Uterine fibroids / blood loss anemia - transfused 3U PRBC on 10/20/23.  - Continues to have vaginal bleeding; however, Hgb stable. Possibly old clots. Will follow closely.   Anita Aguirre Advanced Heart Failure Mechanical Circulatory Support   CRITICAL CARE Performed  by: Ria Commander   Total critical care time: 35 minutes  Critical care time was exclusive of separately billable procedures and treating other patients.  Critical care was necessary to treat or prevent imminent or life-threatening deterioration.  Critical care was time spent personally by me on the following activities: development of treatment plan with patient and/or surrogate as well as nursing, discussions with consultants, evaluation of patient's response to treatment, examination of patient, obtaining history from patient or surrogate, ordering and performing treatments and interventions, ordering and review of laboratory studies, ordering and review of radiographic studies, pulse oximetry and re-evaluation of patient's condition.

## 2023-10-21 NOTE — Progress Notes (Signed)
 RT NOTE:   Vented patient transported to 2H22 without event. Report given to Danielle, RT.

## 2023-10-21 NOTE — Progress Notes (Signed)
 ECLS support: Cannulation date 10/20/23 Indication: RVF 2/2 PE   Configuration: V-A ECMO   Drainage cannula: 25Fr Right femoral vein  Return cannula: 19Fr left femoral artery Antegrade perfusion: 6Fr braided sheath SFA   Pump speed: 4.7 LPM Pump flow: 3436   Sweep gas: 3   Circuit check: No thrombus noted  Anticoagulant: Start bivalirudin today.  Anticoagulation targets: 60-70   Changes in support:  - No significant events overnight  - IR drip catheter in place; continue catheter directed thrombolysis. - Start bival today. Continues to have vaginal bleeding.   Anticipated goals/duration of support:  >48h  Jesse Hirst 9:48 AM

## 2023-10-21 NOTE — Progress Notes (Signed)
 Entered into patients room 2H22. Name and DOB was confirmed. RN was at bedside and stayed at bedside during the removal on the catheters. Saline was stopped from all four channels. Left catheter was pulled back 4-6 CM and pressure was recorded. Left pressure was 25/15 (20). Both catheters were removed followed up both sheaths out of the right internal jugular by [facility] RT(R) and assistance by myself. Manual pressure was held, bleeding was stopped and a tegaderm and gauze was placed over the sheath site. All questions were encouraged and answered. Dr. Hughes was notified of the measurements and the completion of the catheter removals.   Harlene Lesches RT(R)

## 2023-10-21 NOTE — Progress Notes (Signed)
 PHARMACY ANTIBIOTIC CONSULT NOTE   Anita Aguirre a 48 y.o. female admitted on 6/28 with bilateral PE w/ RHS cannulated for VA ECMO 6/28 PM.  Pharmacy has been consulted for Vancomycin and Merrem dosing.  6/29: Vancomycin random level 11 at 1215 today after vancomycin 2000 mg loading dose was given at ~0000. Scr stable at 1.05, WBC 10.9, Lactate 1.2, afebrile.  Estimated Creatinine Clearance: 84.3 mL/min (A) (by C-G formula based on SCr of 1.05 mg/dL (H)).  Plan: Merrem 1g IV Q8h Vancomycin 1000 mg every 12 hours F/u level with AM labs Monitor renal function, clinical status, de-escalation, C/S, levels as indicated   Allergies:  Allergies  Allergen Reactions   Wellbutrin [Bupropion] Hives, Itching and Other (See Comments)    Skin crawling sensation    Filed Weights   10/20/23 0320 10/21/23 0500  Weight: 106.2 kg (234 lb 2.1 oz) 111.4 kg (245 lb 9.5 oz)       Latest Ref Rng & Units 10/21/2023   12:16 PM 10/21/2023    7:54 AM 10/21/2023    5:08 AM  CBC  WBC 4.0 - 10.5 K/uL   10.9   Hemoglobin 12.0 - 15.0 g/dL 7.8  8.8  8.9    8.5   Hematocrit 36.0 - 46.0 % 23.0  26.0  28.3    25.0   Platelets 150 - 400 K/uL   233     Antibiotics Given (last 72 hours)     Date/Time Action Medication Dose Rate   10/21/23 0005 New Bag/Given   vancomycin (VANCOREADY) IVPB 2000 mg/400 mL 2,000 mg 200 mL/hr   10/21/23 0022 New Bag/Given   meropenem (MERREM) 1 g in sodium chloride  0.9 % 100 mL IVPB 1 g 200 mL/hr   10/21/23 0619 New Bag/Given   meropenem (MERREM) 1 g in sodium chloride  0.9 % 100 mL IVPB 1 g 200 mL/hr   10/21/23 1327 New Bag/Given   meropenem (MERREM) 1 g in sodium chloride  0.9 % 100 mL IVPB 1 g 200 mL/hr       Antimicrobials this admission: Vancomycin 6/28 >  Merrem 6/28 >   Microbiology results: 6/28 MRSA PCR: negative  Thank you for allowing pharmacy to be a part of this patient's care.  Morna Breach, PharmD PGY2 Cardiology Pharmacy Resident 10/21/2023 2:06  PM

## 2023-10-21 NOTE — Progress Notes (Addendum)
 PHARMACY - ANTICOAGULATION CONSULT NOTE  Pharmacy Consult for bivalirudin Indication: pulmonary embolus/ECMO  Allergies  Allergen Reactions   Wellbutrin [Bupropion] Hives, Itching and Other (See Comments)    Skin crawling sensation    Patient Measurements: Weight: 111.4 kg (245 lb 9.5 oz)  Vital Signs: Temp: 98.1 F (36.7 C) (06/29 1625) Pulse Rate: 58 (06/29 1625)  Labs: Recent Labs    10/19/23 1559 10/19/23 1710 10/19/23 1825 10/19/23 1856 10/20/23 0643 10/20/23 0911 10/20/23 1928 10/20/23 2325 10/20/23 2344 10/21/23 0055 10/21/23 0508 10/21/23 0754 10/21/23 1216 10/21/23 1500 10/21/23 1548 10/21/23 1549 10/21/23 1601  HGB 9.8*  --   --    < > 8.4*   < > 8.2*  7.8*   < > 9.0*   < > 8.9*  8.5*   < > 7.8*  --  8.4*  --  9.2*  HCT 30.9*  --   --    < > 27.3*   < > 24.0*  23.0*   < > 28.3*   < > 28.3*  25.0*   < > 23.0*  --  27.0*  --  27.0*  PLT 466*  --   --   --  426*  --   --   --  249  --  233  --   --   --   --   --   --   APTT  --  27  --   --   --   --   --   --   --   --   --   --   --  72*  --   --   --   LABPROT  --  14.2  --   --   --   --   --   --  16.9*  --   --   --   --   --   --   --   --   INR  --  1.0  --   --   --   --   --   --  1.3*  --   --   --   --   --   --   --   --   HEPARINUNFRC  --   --   --    < >  --    < >  --   --  0.46  --  0.49  --   --   --   --  <0.10*  --   CREATININE 0.90  --   --   --  1.09*   < > 1.20*  --  1.16*  --  1.05*  --   --   --   --   --   --   TROPONINIHS 393*  --  409*  --  186*  --   --   --   --   --   --   --   --   --   --   --   --    < > = values in this interval not displayed.    Estimated Creatinine Clearance: 84.3 mL/min (A) (by C-G formula based on SCr of 1.05 mg/dL (H)).   Medical History: Past Medical History:  Diagnosis Date   ADD (attention deficit disorder)    Arthritis 04/24/2008   Diffuse cystic mastopathy 04/24/2010   LEFT    Hypertension    Hypothyroidism    Mastitis  Medications:  Scheduled:   sodium chloride    Intravenous Once   Chlorhexidine Gluconate Cloth  6 each Topical Daily   docusate  100 mg Per Tube BID   famotidine  20 mg Per Tube BID   feeding supplement (PROSource TF20)  60 mL Per Tube BID   insulin aspart  0-20 Units Subcutaneous Q4H   polyethylene glycol  17 g Per Tube Daily   sodium chloride  flush  3 mL Intravenous Q12H   thiamine  100 mg Per Tube Daily   Infusions:   sodium chloride      sodium chloride  Stopped (10/21/23 1124)   sodium chloride  Stopped (10/21/23 1124)   bivalirudin (ANGIOMAX) 250 mg in sodium chloride  0.9 % 500 mL (0.5 mg/mL) infusion 0.1 mg/kg/hr (10/21/23 1600)   epinephrine     feeding supplement (VITAL 1.5 CAL) 10 mL/hr at 10/21/23 1649   fentaNYL infusion INTRAVENOUS 200 mcg/hr (10/21/23 1600)   furosemide (LASIX) 200 mg in dextrose 5 % 100 mL (2 mg/mL) infusion 4 mg/hr (10/21/23 1600)   HYDROmorphone 2 mg/hr (10/21/23 1600)   meropenem (MERREM) IV Stopped (10/21/23 1357)   midazolam 10 mg/hr (10/21/23 1600)   vancomycin 1,000 mg (10/21/23 1637)    Assessment: 47 YOF admitted with submassive PE with right heart strain. RV/LV ration 1.4. CT with bilateral PE with severe clot burden. Patient not on anticoagulation prior to hospitalization. Patient also has ongoing vaginal bleeding due to uterine fibroids, noted to be a chronic issue. Pharmacy initially consulted for heparin dosing. Patient later decompensated, started on ECMO on 6/28 PM. Patient received catheter directed alteplase overnight 6/28-6/29, heparin was paused for ~1 hour then resumed at 0100 until 0745 on 10/21/23. Heparin level was therapeutic at 0.49 in the morning morning. Pharmacy has now been consulted for bivalirudin dosing while patient is on ECMO.  6/29: Patient having ongoing vaginal bleeding due to uterine fibroids. Hgb remains low at 8.9, PLT decreased to 233.  6/29 PM - initial aPTT on bival of 72 sec is above goal on 0.1 mg/kg/hr.   Per HF, targeting lower end of aPTT range (~50-60) if possible.  S/p successful embolization of bilateral uterine arteries and bilateral cath-directed lysis for PE yesterday (6/28).  Left IJ site slightly oozing, some vaginal bleeding but improved after embolization yesterday; no other overt bleeding noted.  H/H improved (Hgb 7.8 > 8.4) - received 3 units PRBC during ECMO cannulation.    6/29 PM update - confirmatory aPTT 62 sec is at goal on 0.08 mg/kg/hr.  No other notable updates - IJ site and vaginal oozing stable.   Goal of Therapy:  aPTT 50-70 seconds Monitor platelets by anticoagulation protocol: Yes   Plan:  Continue bivalirudin to 0.08 mg/kg/hr F/u aPTT in AM Monitor CBC and s/sx of bleeding daily  Maurilio Fila, PharmD Clinical Pharmacist 10/21/2023  5:01 PM

## 2023-10-22 ENCOUNTER — Inpatient Hospital Stay (HOSPITAL_COMMUNITY): Payer: MEDICAID

## 2023-10-22 ENCOUNTER — Other Ambulatory Visit (HOSPITAL_COMMUNITY): Payer: Self-pay

## 2023-10-22 ENCOUNTER — Encounter (HOSPITAL_COMMUNITY): Payer: Self-pay | Admitting: Cardiology

## 2023-10-22 DIAGNOSIS — Z452 Encounter for adjustment and management of vascular access device: Secondary | ICD-10-CM

## 2023-10-22 DIAGNOSIS — I2699 Other pulmonary embolism without acute cor pulmonale: Secondary | ICD-10-CM

## 2023-10-22 DIAGNOSIS — Z9911 Dependence on respirator [ventilator] status: Secondary | ICD-10-CM

## 2023-10-22 DIAGNOSIS — I2602 Saddle embolus of pulmonary artery with acute cor pulmonale: Secondary | ICD-10-CM

## 2023-10-22 LAB — ECHOCARDIOGRAM LIMITED: Weight: 3735.47 [oz_av]

## 2023-10-22 LAB — POCT I-STAT 7, (LYTES, BLD GAS, ICA,H+H)
Acid-Base Excess: 1 mmol/L (ref 0.0–2.0)
Acid-Base Excess: 2 mmol/L (ref 0.0–2.0)
Acid-Base Excess: 3 mmol/L — ABNORMAL HIGH (ref 0.0–2.0)
Acid-Base Excess: 1 mmol/L (ref 0.0–2.0)
Acid-Base Excess: 3 mmol/L — ABNORMAL HIGH (ref 0.0–2.0)
Acid-Base Excess: 1 mmol/L (ref 0.0–2.0)
Bicarbonate: 26 mmol/L (ref 20.0–28.0)
Bicarbonate: 26.5 mmol/L (ref 20.0–28.0)
Bicarbonate: 28.6 mmol/L — ABNORMAL HIGH (ref 20.0–28.0)
Bicarbonate: 27.3 mmol/L (ref 20.0–28.0)
Bicarbonate: 27.3 mmol/L (ref 20.0–28.0)
Bicarbonate: 25.1 mmol/L (ref 20.0–28.0)
Calcium, Ion: 1.09 mmol/L — ABNORMAL LOW (ref 1.15–1.40)
Calcium, Ion: 1.08 mmol/L — ABNORMAL LOW (ref 1.15–1.40)
Calcium, Ion: 1.23 mmol/L (ref 1.15–1.40)
Calcium, Ion: 1.18 mmol/L (ref 1.15–1.40)
Calcium, Ion: 1.16 mmol/L (ref 1.15–1.40)
Calcium, Ion: 1.14 mmol/L — ABNORMAL LOW (ref 1.15–1.40)
HCT: 24 % — ABNORMAL LOW (ref 36.0–46.0)
HCT: 23 % — ABNORMAL LOW (ref 36.0–46.0)
HCT: 23 % — ABNORMAL LOW (ref 36.0–46.0)
HCT: 28 % — ABNORMAL LOW (ref 36.0–46.0)
HCT: 22 % — ABNORMAL LOW (ref 36.0–46.0)
HCT: 24 % — ABNORMAL LOW (ref 36.0–46.0)
Hemoglobin: 8.2 g/dL — ABNORMAL LOW (ref 12.0–15.0)
Hemoglobin: 7.8 g/dL — ABNORMAL LOW (ref 12.0–15.0)
Hemoglobin: 7.8 g/dL — ABNORMAL LOW (ref 12.0–15.0)
Hemoglobin: 9.5 g/dL — ABNORMAL LOW (ref 12.0–15.0)
Hemoglobin: 7.5 g/dL — ABNORMAL LOW (ref 12.0–15.0)
Hemoglobin: 8.2 g/dL — ABNORMAL LOW (ref 12.0–15.0)
O2 Saturation: 94 %
O2 Saturation: 96 %
O2 Saturation: 94 %
O2 Saturation: 94 %
O2 Saturation: 95 %
O2 Saturation: 95 %
Patient temperature: 36.9
Patient temperature: 37
Patient temperature: 36.9
Patient temperature: 37.2
Patient temperature: 36.9
Patient temperature: 36.6
Potassium: 3.7 mmol/L (ref 3.5–5.1)
Potassium: 4 mmol/L (ref 3.5–5.1)
Potassium: 4.2 mmol/L (ref 3.5–5.1)
Potassium: 4.5 mmol/L (ref 3.5–5.1)
Potassium: 4.2 mmol/L (ref 3.5–5.1)
Potassium: 4.1 mmol/L (ref 3.5–5.1)
Sodium: 146 mmol/L — ABNORMAL HIGH (ref 135–145)
Sodium: 146 mmol/L — ABNORMAL HIGH (ref 135–145)
Sodium: 145 mmol/L (ref 135–145)
Sodium: 144 mmol/L (ref 135–145)
Sodium: 145 mmol/L (ref 135–145)
Sodium: 147 mmol/L — ABNORMAL HIGH (ref 135–145)
TCO2: 27 mmol/L (ref 22–32)
TCO2: 28 mmol/L (ref 22–32)
TCO2: 30 mmol/L (ref 22–32)
TCO2: 29 mmol/L (ref 22–32)
TCO2: 28 mmol/L (ref 22–32)
TCO2: 26 mmol/L (ref 22–32)
pCO2 arterial: 41.3 mmHg (ref 32–48)
pCO2 arterial: 40.5 mmHg (ref 32–48)
pCO2 arterial: 45.9 mmHg (ref 32–48)
pCO2 arterial: 53.7 mmHg — ABNORMAL HIGH (ref 32–48)
pCO2 arterial: 40.9 mmHg (ref 32–48)
pCO2 arterial: 36.7 mmHg (ref 32–48)
pH, Arterial: 7.407 (ref 7.35–7.45)
pH, Arterial: 7.424 (ref 7.35–7.45)
pH, Arterial: 7.401 (ref 7.35–7.45)
pH, Arterial: 7.315 — ABNORMAL LOW (ref 7.35–7.45)
pH, Arterial: 7.431 (ref 7.35–7.45)
pH, Arterial: 7.442 (ref 7.35–7.45)
pO2, Arterial: 71 mmHg — ABNORMAL LOW (ref 83–108)
pO2, Arterial: 77 mmHg — ABNORMAL LOW (ref 83–108)
pO2, Arterial: 72 mmHg — ABNORMAL LOW (ref 83–108)
pO2, Arterial: 79 mmHg — ABNORMAL LOW (ref 83–108)
pO2, Arterial: 72 mmHg — ABNORMAL LOW (ref 83–108)
pO2, Arterial: 71 mmHg — ABNORMAL LOW (ref 83–108)

## 2023-10-22 LAB — MAGNESIUM
Magnesium: 2.1 mg/dL (ref 1.7–2.4)
Magnesium: 2.1 mg/dL (ref 1.7–2.4)

## 2023-10-22 LAB — COOXEMETRY PANEL
Carboxyhemoglobin: 2.1 % — ABNORMAL HIGH (ref 0.5–1.5)
Carboxyhemoglobin: 2 % — ABNORMAL HIGH (ref 0.5–1.5)
Carboxyhemoglobin: 2.5 % — ABNORMAL HIGH (ref 0.5–1.5)
Methemoglobin: <0.7 % (ref 0.0–1.5)
Methemoglobin: <0.7 % (ref 0.0–1.5)
Methemoglobin: <0.7 % (ref 0.0–1.5)
O2 Saturation: 76.1 %
O2 Saturation: 77.3 %
O2 Saturation: 72.3 %
Total hemoglobin: 7.7 g/dL — ABNORMAL LOW (ref 12.0–16.0)
Total hemoglobin: 9 g/dL — ABNORMAL LOW (ref 12.0–16.0)
Total hemoglobin: 7.7 g/dL — ABNORMAL LOW (ref 12.0–16.0)

## 2023-10-22 LAB — BASIC METABOLIC PANEL WITH GFR
Anion gap: 10 (ref 5–15)
Anion gap: 11 (ref 5–15)
BUN: 9 mg/dL (ref 6–20)
BUN: 11 mg/dL (ref 6–20)
CO2: 25 mmol/L (ref 22–32)
CO2: 25 mmol/L (ref 22–32)
Calcium: 7.5 mg/dL — ABNORMAL LOW (ref 8.9–10.3)
Calcium: 8 mg/dL — ABNORMAL LOW (ref 8.9–10.3)
Chloride: 112 mmol/L — ABNORMAL HIGH (ref 98–111)
Chloride: 108 mmol/L (ref 98–111)
Creatinine, Ser: 0.92 mg/dL (ref 0.44–1.00)
Creatinine, Ser: 0.9 mg/dL (ref 0.44–1.00)
GFR, Estimated: >60 mL/min (ref >60)
GFR, Estimated: >60 mL/min (ref >60)
Glucose, Bld: 116 mg/dL — ABNORMAL HIGH (ref 70–99)
Glucose, Bld: 96 mg/dL (ref 70–99)
Potassium: 3.7 mmol/L (ref 3.5–5.1)
Potassium: 4.2 mmol/L (ref 3.5–5.1)
Sodium: 147 mmol/L — ABNORMAL HIGH (ref 135–145)
Sodium: 144 mmol/L (ref 135–145)

## 2023-10-22 LAB — CBC
HCT: 27.5 % — ABNORMAL LOW (ref 36.0–46.0)
Hemoglobin: 8.4 g/dL — ABNORMAL LOW (ref 12.0–15.0)
MCH: 25.8 pg — ABNORMAL LOW (ref 26.0–34.0)
MCHC: 30.5 g/dL (ref 30.0–36.0)
MCV: 84.4 fL (ref 80.0–100.0)
Platelets: 178 10*3/uL (ref 150–400)
RBC: 3.26 MIL/uL — ABNORMAL LOW (ref 3.87–5.11)
RDW: 20.3 % — ABNORMAL HIGH (ref 11.5–15.5)
WBC: 10.3 10*3/uL (ref 4.0–10.5)
nRBC: 0 % (ref 0.0–0.2)

## 2023-10-22 LAB — PHOSPHORUS
Phosphorus: 2.3 mg/dL — ABNORMAL LOW (ref 2.5–4.6)
Phosphorus: 4.4 mg/dL (ref 2.5–4.6)

## 2023-10-22 LAB — CG4 I-STAT (LACTIC ACID)
Lactic Acid, Venous: 1.5 mmol/L (ref 0.5–1.9)
Lactic Acid, Venous: 1.2 mmol/L (ref 0.5–1.9)
Lactic Acid, Venous: 0.9 mmol/L (ref 0.5–1.9)

## 2023-10-22 LAB — HEMOGLOBIN AND HEMATOCRIT, BLOOD
HCT: 26.4 % — ABNORMAL LOW (ref 36.0–46.0)
HCT: 25.1 % — ABNORMAL LOW (ref 36.0–46.0)
HCT: 26.5 % — ABNORMAL LOW (ref 36.0–46.0)
Hemoglobin: 7.9 g/dL — ABNORMAL LOW (ref 12.0–15.0)
Hemoglobin: 7.5 g/dL — ABNORMAL LOW (ref 12.0–15.0)
Hemoglobin: 8.2 g/dL — ABNORMAL LOW (ref 12.0–15.0)

## 2023-10-22 LAB — GLUCOSE, CAPILLARY
Glucose-Capillary: 116 mg/dL — ABNORMAL HIGH (ref 70–99)
Glucose-Capillary: 123 mg/dL — ABNORMAL HIGH (ref 70–99)
Glucose-Capillary: 151 mg/dL — ABNORMAL HIGH (ref 70–99)
Glucose-Capillary: 102 mg/dL — ABNORMAL HIGH (ref 70–99)
Glucose-Capillary: 120 mg/dL — ABNORMAL HIGH (ref 70–99)

## 2023-10-22 LAB — VANCOMYCIN, TROUGH: Vancomycin Tr: 12 ug/mL — ABNORMAL LOW (ref 15–20)

## 2023-10-22 LAB — APTT
aPTT: 63 s — ABNORMAL HIGH (ref 24–36)
aPTT: 69 s — ABNORMAL HIGH (ref 24–36)

## 2023-10-22 LAB — LACTATE DEHYDROGENASE
LDH: 286 U/L — ABNORMAL HIGH (ref 98–192)
LDH: 295 U/L — ABNORMAL HIGH (ref 98–192)

## 2023-10-22 LAB — PREPARE RBC (CROSSMATCH)

## 2023-10-22 MED ORDER — POTASSIUM CHLORIDE 20 MEQ PO PACK
40.0000 meq | PACK | Freq: Once | ORAL | Status: AC
  Administered 2023-10-22: 40 meq
  Filled 2023-10-22: qty 2

## 2023-10-22 MED ORDER — SODIUM CHLORIDE 0.9% IV SOLUTION: Freq: Once | INTRAVENOUS | Status: AC

## 2023-10-22 MED ORDER — ALBUMIN HUMAN 5 % IV SOLN
12.5000 g | INTRAVENOUS | Status: DC | PRN
  Administered 2023-10-22 (×3): 12.5 g via INTRAVENOUS
  Filled 2023-10-22 (×3): qty 250

## 2023-10-22 MED ORDER — CALCIUM GLUCONATE-NACL 2-0.675 GM/100ML-% IV SOLN
2.0000 g | Freq: Once | INTRAVENOUS | Status: AC
  Administered 2023-10-22: 2000 mg via INTRAVENOUS
  Filled 2023-10-22: qty 100

## 2023-10-22 MED ORDER — MIDAZOLAM HCL 2 MG/2ML IJ SOLN
2.0000 mg | Freq: Once | INTRAMUSCULAR | Status: AC
  Administered 2023-10-22: 2 mg via INTRAVENOUS

## 2023-10-22 MED ORDER — SODIUM CHLORIDE 0.9 % IV SOLN
0.5000 mg/h | INTRAVENOUS | Status: DC
  Administered 2023-10-22: 4 mg/h via INTRAVENOUS
  Administered 2023-10-22: 5 mg/h via INTRAVENOUS
  Administered 2023-10-23 – 2023-10-24 (×5): 5.5 mg/h via INTRAVENOUS
  Administered 2023-10-25: 4.5 mg/h via INTRAVENOUS
  Filled 2023-10-22 (×9): qty 5

## 2023-10-22 MED ORDER — ORAL CARE MOUTH RINSE
15.0000 mL | OROMUCOSAL | Status: DC | PRN
Start: 2023-10-22 — End: 2023-10-28

## 2023-10-22 MED ORDER — ORAL CARE MOUTH RINSE
15.0000 mL | OROMUCOSAL | Status: DC
  Administered 2023-10-22 – 2023-10-28 (×77): 15 mL via OROMUCOSAL

## 2023-10-22 MED ORDER — HYDROMORPHONE BOLUS VIA INFUSION
2.0000 mg | Freq: Once | INTRAVENOUS | Status: AC
  Administered 2023-10-22: 2 mg via INTRAVENOUS

## 2023-10-22 MED ORDER — VANCOMYCIN HCL 1250 MG/250ML IV SOLN
1250.0000 mg | Freq: Two times a day (BID) | INTRAVENOUS | Status: DC
  Administered 2023-10-22 – 2023-10-25 (×6): 1250 mg via INTRAVENOUS
  Filled 2023-10-22 (×6): qty 250

## 2023-10-22 MED ORDER — POTASSIUM PHOSPHATES 15 MMOLE/5ML IV SOLN
30.0000 mmol | Freq: Once | INTRAVENOUS | Status: AC
  Administered 2023-10-22: 30 mmol via INTRAVENOUS
  Filled 2023-10-22: qty 10

## 2023-10-22 MED ORDER — ALBUMIN HUMAN 5 % IV SOLN
12.5000 g | Freq: Once | INTRAVENOUS | Status: AC
  Administered 2023-10-22: 12.5 g via INTRAVENOUS

## 2023-10-22 MED ORDER — MILRINONE LACTATE IN DEXTROSE 20-5 MG/100ML-% IV SOLN
0.1250 ug/kg/min | INTRAVENOUS | Status: DC
  Administered 2023-10-22 – 2023-10-26 (×5): 0.125 ug/kg/min via INTRAVENOUS
  Filled 2023-10-22 (×5): qty 100

## 2023-10-22 MED FILL — Lidocaine HCl Local Inj 1%: INTRAMUSCULAR | Qty: 15 | Status: AC

## 2023-10-22 MED FILL — Lidocaine HCl Local Inj 1%: INTRAMUSCULAR | Qty: 40 | Status: AC

## 2023-10-22 MED FILL — Fentanyl Citrate Preservative Free (PF) Inj 2500 MCG/50ML: INTRAMUSCULAR | Qty: 50 | Status: AC

## 2023-10-22 MED FILL — Sodium Chloride IV Soln 0.9%: INTRAVENOUS | Qty: 250 | Status: AC

## 2023-10-22 NOTE — Progress Notes (Addendum)
 Patient ID: Anita Aguirre, female   DOB: 1975-06-05, 48 y.o.   MRN: [id number] ECLS support: Cannulation date 10/20/23 Indication: RVF 2/2 PE   Configuration: V-A ECMO   Drainage cannula: 25Fr Right femoral vein  Return cannula: 19Fr left femoral artery Antegrade perfusion: 6Fr braided sheath SFA   Pump speed: 4.3 LPM Pump flow: 3200   Sweep gas: 2.5   Circuit check: No thrombus Anticoagulant: Bivalirudin.  Anticoagulation targets: 50-80   Changes in support:  - Echo today, start to wean support if RV looks improved.    Anticipated goals/duration of support:  24-48hrs  Ezra Shuck 10/22/2023 8:23 AM

## 2023-10-22 NOTE — Progress Notes (Signed)
 PHARMACY - ANTICOAGULATION CONSULT NOTE  Pharmacy Consult for bivalirudin Indication: pulmonary embolus/ECMO  Allergies  Allergen Reactions   Wellbutrin [Bupropion] Hives, Itching and Other (See Comments)    Skin crawling sensation    Patient Measurements: Weight: 105.9 kg (233 lb 7.5 oz)  Vital Signs: Temp: 98.4 F (36.9 C) (06/30 0930) Temp Source: Bladder (06/30 0800) BP: 96/65 (06/30 0210) Pulse Rate: 73 (06/30 0752)  Labs: Recent Labs     0000 10/19/23 1559 10/19/23 1710 10/19/23 1825 10/19/23 1856 10/20/23 0643 10/20/23 0911 10/20/23 2344 10/21/23 0055 10/21/23 0508 10/21/23 0754 10/21/23 1500 10/21/23 1548 10/21/23 1549 10/21/23 1601 10/21/23 1735 10/21/23 1813 10/21/23 2120 10/21/23 2121 10/22/23 0410 10/22/23 0413 10/22/23 0829 10/22/23 0927 10/22/23 0937  HGB  --  9.8*  --   --    < > 8.4*   < > 9.0*   < > 8.9*  8.5*   < >  --    < >  --    < > 8.4*   < >  --    < > 8.4*   < > 7.8* 7.9* 7.8*  HCT  --  30.9*  --   --    < > 27.3*   < > 28.3*   < > 28.3*  25.0*   < >  --    < >  --    < > 26.6*   < >  --    < > 27.5*   < > 23.0* 26.4* 23.0*  PLT  --  466*  --   --   --  426*  --  249  --  233  --   --   --   --   --  192  --   --   --  178  --   --   --   --   APTT   < >  --  27  --   --   --   --   --   --   --   --  72*  --   --   --   --   --  63*  --  63*  --   --   --   --   LABPROT  --   --  14.2  --   --   --   --  16.9*  --   --   --   --   --   --   --   --   --   --   --   --   --   --   --   --   INR  --   --  1.0  --   --   --   --  1.3*  --   --   --   --   --   --   --   --   --   --   --   --   --   --   --   --   HEPARINUNFRC  --   --   --   --    < >  --    < > 0.46  --  0.49  --   --   --  <0.10*  --   --   --   --   --   --   --   --   --   --   CREATININE  --  0.90  --   --   --  1.09*   < > 1.16*  --  1.05*  --   --   --   --   --  0.90  --   --   --  0.92  --   --   --   --   TROPONINIHS  --  393*  --  409*  --  186*  --   --    --   --   --   --   --   --   --   --   --   --   --   --   --   --   --   --    < > = values in this interval not displayed.    Estimated Creatinine Clearance: 93.6 mL/min (by C-G formula based on SCr of 0.92 mg/dL).   Medical History: Past Medical History:  Diagnosis Date   ADD (attention deficit disorder)    Arthritis 04/24/2008   Diffuse cystic mastopathy 04/24/2010   LEFT    Hypertension    Hypothyroidism    Mastitis     Medications:  Scheduled:   sodium chloride    Intravenous Once   Chlorhexidine Gluconate Cloth  6 each Topical Daily   docusate  100 mg Per Tube BID   famotidine  20 mg Per Tube BID   feeding supplement (PROSource TF20)  60 mL Per Tube BID   insulin aspart  0-20 Units Subcutaneous Q4H   mouth rinse  15 mL Mouth Rinse Q2H   polyethylene glycol  17 g Per Tube Daily   sodium chloride  flush  3 mL Intravenous Q12H   thiamine  100 mg Per Tube Daily   Infusions:   albumin human Stopped (10/22/23 0548)   bivalirudin (ANGIOMAX) 250 mg in sodium chloride  0.9 % 500 mL (0.5 mg/mL) infusion 0.08 mg/kg/hr (10/22/23 0900)   feeding supplement (VITAL 1.5 CAL) 30 mL/hr at 10/22/23 0901   furosemide (LASIX) 200 mg in dextrose 5 % 100 mL (2 mg/mL) infusion 4 mg/hr (10/22/23 0900)   HYDROmorphone     meropenem (MERREM) IV Stopped (10/22/23 0540)   midazolam 10 mg/hr (10/22/23 0900)   potassium PHOSPHATE IVPB (in mmol) 85 mL/hr at 10/22/23 0900   vancomycin      Assessment: 47 YOF admitted with submassive PE with right heart strain. RV/LV ration 1.4. CT with bilateral PE with severe clot burden. Patient not on anticoagulation prior to hospitalization. Patient also has ongoing vaginal bleeding due to uterine fibroids, noted to be a chronic issue. Pharmacy initially consulted for heparin dosing. Patient later decompensated, started on ECMO on 6/28 PM.  Patient received catheter directed alteplase overnight 6/28-6/29, heparin was paused for ~1 hour then resumed at 0100  until 0745 on 10/21/23. Heparin level was therapeutic at 0.49 in the morning morning. Pharmacy has now been consulted for bivalirudin dosing while patient is on ECMO.  Patient having ongoing vaginal bleeding due to uterine fibroids > embolization  Bleeding ongoing but improved  Bivalirudin drip 0.08mg /kg/hr with aptt 63sec at goal .   Hgb stable 8 pltc 170 fibrinogen 300s LDH 280   Goal of Therapy:  aPTT 50-70 seconds Monitor platelets by anticoagulation protocol: Yes   Plan:  Continue bivalirudin to 0.08 mg/kg/hr Aptt q12 5a/5p Monitor CBC and s/sx of bleeding daily   Olam Chalk Pharm.D. CPP, BCPS Clinical Pharmacist [phone] 10/22/2023 10:41 AM

## 2023-10-22 NOTE — Progress Notes (Signed)
 Echocardiogram 2D Echocardiogram has been performed.  Anita Aguirre Marian Meneely RDCS 10/22/2023, 9:11 AM

## 2023-10-22 NOTE — Progress Notes (Signed)
 PHARMACY - ANTICOAGULATION CONSULT NOTE  Pharmacy Consult for bivalirudin Indication: pulmonary embolus/ECMO  Allergies  Allergen Reactions   Wellbutrin [Bupropion] Hives, Itching and Other (See Comments)    Skin crawling sensation    Patient Measurements: Weight: 105.9 kg (233 lb 7.5 oz)  Vital Signs: Temp: 98.4 F (36.9 C) (06/30 1530) Temp Source: Bladder (06/30 0800) Pulse Rate: 75 (06/30 1000)  Labs: Recent Labs    10/19/23 1710 10/19/23 1825 10/19/23 1856 10/20/23 0643 10/20/23 0911 10/20/23 2344 10/21/23 0055 10/21/23 0508 10/21/23 0754 10/21/23 1549 10/21/23 1601 10/21/23 1735 10/21/23 1813 10/21/23 2120 10/21/23 2121 10/22/23 0410 10/22/23 0413 10/22/23 1140 10/22/23 1618 10/22/23 1630  HGB  --   --    < > 8.4*   < > 9.0*   < > 8.9*  8.5*   < >  --    < > 8.4*   < >  --    < > 8.4*   < > 9.5* 7.5* 7.5*  HCT  --   --    < > 27.3*   < > 28.3*   < > 28.3*  25.0*   < >  --    < > 26.6*   < >  --    < > 27.5*   < > 28.0* 25.1* 22.0*  PLT  --   --    < > 426*  --  249  --  233  --   --   --  192  --   --   --  178  --   --   --   --   APTT 27  --   --   --   --   --   --   --    < >  --   --   --   --  63*  --  63*  --   --  69*  --   LABPROT 14.2  --   --   --   --  16.9*  --   --   --   --   --   --   --   --   --   --   --   --   --   --   INR 1.0  --   --   --   --  1.3*  --   --   --   --   --   --   --   --   --   --   --   --   --   --   HEPARINUNFRC  --   --    < >  --    < > 0.46  --  0.49  --  <0.10*  --   --   --   --   --   --   --   --   --   --   CREATININE  --   --   --  1.09*   < > 1.16*  --  1.05*  --   --   --  0.90  --   --   --  0.92  --   --   --   --   TROPONINIHS  --  409*  --  186*  --   --   --   --   --   --   --   --   --   --   --   --   --   --   --   --    < > =  values in this interval not displayed.    Estimated Creatinine Clearance: 93.6 mL/min (by C-G formula based on SCr of 0.92 mg/dL).   Medical History: Past Medical  History:  Diagnosis Date   ADD (attention deficit disorder)    Arthritis 04/24/2008   Diffuse cystic mastopathy 04/24/2010   LEFT    Hypertension    Hypothyroidism    Mastitis     Medications:  Scheduled:   sodium chloride    Intravenous Once   Chlorhexidine Gluconate Cloth  6 each Topical Daily   docusate  100 mg Per Tube BID   famotidine  20 mg Per Tube BID   feeding supplement (PROSource TF20)  60 mL Per Tube BID   insulin aspart  0-20 Units Subcutaneous Q4H   mouth rinse  15 mL Mouth Rinse Q2H   polyethylene glycol  17 g Per Tube Daily   sodium chloride  flush  3 mL Intravenous Q12H   thiamine  100 mg Per Tube Daily   Infusions:   albumin human Stopped (10/22/23 1227)   bivalirudin (ANGIOMAX) 250 mg in sodium chloride  0.9 % 500 mL (0.5 mg/mL) infusion 0.08 mg/kg/hr (10/22/23 1600)   feeding supplement (VITAL 1.5 CAL) 1,000 mL (10/22/23 1647)   furosemide (LASIX) 200 mg in dextrose 5 % 100 mL (2 mg/mL) infusion 2 mg/hr (10/22/23 1630)   HYDROmorphone 5 mg/hr (10/22/23 1600)   meropenem (MERREM) IV Stopped (10/22/23 1505)   midazolam 10 mg/hr (10/22/23 1634)   milrinone 0.125 mcg/kg/min (10/22/23 1600)   vancomycin 1,250 mg (10/22/23 1640)    Assessment: 47 YOF admitted with submassive PE with right heart strain. RV/LV ration 1.4. CT with bilateral PE with severe clot burden. Patient not on anticoagulation prior to hospitalization. Patient also has ongoing vaginal bleeding due to uterine fibroids, noted to be a chronic issue. Pharmacy initially consulted for heparin dosing. Patient later decompensated, started on ECMO on 6/28 PM.  Patient received catheter directed alteplase overnight 6/28-6/29, heparin was paused for ~1 hour then resumed at 0100 until 0745 on 10/21/23. Heparin level was therapeutic at 0.49 in the morning. Pharmacy has now been consulted for bivalirudin dosing while patient is on ECMO.  Patient having ongoing vaginal bleeding due to uterine fibroids >  embolization  Bleeding ongoing but improved  Bivalirudin drip 0.08 mg/kg/hr with aptt 69 sec   Hgb stable 7.5, pltc 178 fibrinogen 281 LDH 295   Goal of Therapy:  aPTT 50-70 seconds Monitor platelets by anticoagulation protocol: Yes   Plan:  Reduce bivalirudin slightly to 0.07 mg/kg/hr given bleeding and at upper end of therapeutic  Aptt q12 5a/5p Monitor CBC and s/sx of bleeding daily  Rankin Sams, PharmD, BCPS, BCCCP Clinical Pharmacist

## 2023-10-22 NOTE — Progress Notes (Addendum)
 Advanced Heart Failure Rounding Note  Cardiologist: None  Chief Complaint: VA ECMO Subjective:    No pressors, stable MAP.   CVP 11, I/Os net negative -1452.  Lasix 4 mg/hr.   Few vaginal clots, no overt bleeding.   VA ECMO Flow 4.3 L/min Speed 3200 rpm Pven -53 DeltaP 29 Sweep 2.5 ABG 7.41/41/71 Lactate 1.5 PTT 63 on bivalirudin goal 50-80 Co-ox 76%  Vent FiO2 0.45  Objective:   Weight Range: 105.9 kg Body mass index is 36.57 kg/m.   Vital Signs:   Temp:  [97.9 F (36.6 C)-98.4 F (36.9 C)] 98.4 F (36.9 C) (06/30 0800) Pulse Rate:  [57-76] 73 (06/30 0752) Resp:  [12-24] 16 (06/30 0800) BP: (96)/(62-65) 96/65 (06/30 0210) SpO2:  [92 %-100 %] 98 % (06/30 0800) Arterial Line BP: (92-117)/(59-75) 95/63 (06/30 0800) FiO2 (%):  [45 %] 45 % (06/30 0800) Weight:  [105.9 kg] 105.9 kg (06/30 0500) Last BM Date : 10/19/23  Weight change: Filed Weights   10/20/23 0320 10/21/23 0500 10/22/23 0500  Weight: 106.2 kg 111.4 kg 105.9 kg    Intake/Output:   Intake/Output Summary (Last 24 hours) at 10/22/2023 0825 Last data filed at 10/22/2023 0800 Gross per 24 hour  Intake 2595.38 ml  Output 4110 ml  Net -1514.62 ml      Physical Exam    General: NAD Neck: internal jugular catheters Lungs: Clear to auscultation bilaterally with normal respiratory effort. CV: Nondisplaced PMI.  Heart regular S1/S2, no S3/S4, no murmur.  No peripheral edema.   Abdomen: Soft, nontender, no hepatosplenomegaly, no distention.  Skin: Intact without lesions or rashes.  Neurologic: Sedated on vent. Extremities: No clubbing or cyanosis.  HEENT: Normal.    Telemetry   NSR (personally reviewed)  Labs    CBC Recent Labs    10/21/23 1735 10/21/23 1813 10/22/23 0410 10/22/23 0413  WBC 9.4  --  10.3  --   HGB 8.4*   < > 8.4* 8.2*  HCT 26.6*   < > 27.5* 24.0*  MCV 83.1  --  84.4  --   PLT 192  --  178  --    < > = values in this interval not displayed.   Basic  Metabolic Panel Recent Labs    93/70/74 1548 10/21/23 1601 10/21/23 1735 10/21/23 1813 10/22/23 0410 10/22/23 0413  NA  --    < > 143   < > 147* 146*  K  --    < > 3.5   < > 3.7 3.7  CL  --   --  110  --  112*  --   CO2  --   --  22  --  25  --   GLUCOSE  --   --  103*  --  116*  --   BUN  --   --  9  --  9  --   CREATININE  --   --  0.90  --  0.92  --   CALCIUM  --   --  7.5*  --  7.5*  --   MG 2.7*  --   --   --  2.1  --   PHOS 3.3  --   --   --  2.3*  --    < > = values in this interval not displayed.   Liver Function Tests Recent Labs    10/19/23 1559 10/20/23 1525 10/20/23 2344  AST 28  --  18  ALT 12  --  9  ALKPHOS 22*  --  14*  BILITOT 0.4  --  1.2  PROT 7.3  --  5.1*  ALBUMIN 3.3* 3.1* 2.8*   No results for input(s): LIPASE, AMYLASE in the last 72 hours. Cardiac Enzymes No results for input(s): CKTOTAL, CKMB, CKMBINDEX, TROPONINI in the last 72 hours.  BNP: BNP (last 3 results) Recent Labs    10/20/23 0643  BNP 308.4*    ProBNP (last 3 results) No results for input(s): PROBNP in the last 8760 hours.   D-Dimer No results for input(s): DDIMER in the last 72 hours. Hemoglobin A1C Recent Labs    10/20/23 0643  HGBA1C 5.0   Fasting Lipid Panel No results for input(s): CHOL, HDL, LDLCALC, TRIG, CHOLHDL, LDLDIRECT in the last 72 hours. Thyroid Function Tests No results for input(s): TSH, T4TOTAL, T3FREE, THYROIDAB in the last 72 hours.  Invalid input(s): FREET3  Other results:   Imaging    DG CHEST PORT 1 VIEW Result Date: 10/22/2023 CLINICAL DATA:  [id number]. Patient receiving ECMO. Follow-up lung status. Most recently with acute pulmonary emboli. EXAM: PORTABLE CHEST 1 VIEW COMPARISON:  Portable chest yesterday at 5:40 a.m. FINDINGS: 5:24 a.m. ETT tip is 3.7 cm from the carina, NGT well inside the stomach but the side hole and tip are not in view. Left IJ Swan-Ganz catheter has been advanced into right  lower lobe pulmonary artery. Both a right IJ central line and ECMO catheter extending up from below terminate at the expected level of the inferior cavoatrial junction. Electrical pads overlie the field on the left. There is stable cardiomegaly. Increased opacity left lower lobe which could be atelectasis or consolidation. Small new left pleural effusion. Remaining hypoexpanded lungs appear clear. The mediastinum is stable. Central vascular prominence is present without overt edema. Thoracic cage is intact. IMPRESSION: 1. Increased opacity left lower lobe which could be atelectasis or consolidation. Small new left pleural effusion. 2. Stable cardiomegaly and central vascular prominence without overt edema. 3. Support apparatus as above. Electronically Signed   By: Francis Quam M.D.   On: 10/22/2023 06:01   IR THROMB F/U EVAL ART/VEN FINAL DAY (MS) Result Date: 10/21/2023 INDICATION: Submassive PE. RV/LV ratio 1.4. Failed conservative management, with hypoxemia requiring ECMO cannulation. Post initiation of bilateral pulmonary arterial thrombolysis on 10/20/2023. Patient has completed prescribed course of bilateral pulmonary arterial lytic infusion and presents today for repeat pulmonary arterial pressure measurements prior to infusion catheter removal. EXAM: PULMONARY ARTERIAL PRESSURE MEASUREMENT and INFUSION CATHETER(S) REMOVAL COMPARISON:  Chest XR, 10/21/2023. IR PE lytic catheter placement, 10/20/2023. CT AP MEDICATIONS: None CONTRAST:  None FLUOROSCOPY TIME:  None COMPLICATIONS: None immediate. TECHNIQUE: The patient was positioned supine on her hospital bed. Review of this morning's chest radiograph demonstrates unchanged positioning of bilateral pulmonary arterial infusion catheters with tip of the right-sided pulmonary arterial infusion catheter approximately 5-10 cm to the expected outflow of the main pulmonary artery. As such, the right pulmonary arterial catheter's infusion wire was removed and the  multi side-hole infusion catheter was retracted to the estimated location of the main pulmonary artery. Pressure measurements were acquired from this location. At this point, the procedure was terminated. All wires, catheters and sheaths were removed from the patient. Hemostasis was achieved at the right groin access site with manual compression. A dressing was placed. The patient tolerated the procedure well without immediate postprocedural complication. Procedure assisted by [facility], IR RT and Harlene Lesches, IR RT. FINDINGS: Acquired pressure measurements as follows: Preprocedural main pulmonary artery-58/25;  mean-34 mmHg (normal: < 25/10) Postprocedural main pulmonary artery-25/15; mean-20 mmHg IMPRESSION: Successful bilateral catheter-directed pulmonary arterial thrombolysis with (14 mmHg) reduction in pressure within the main pulmonary artery. Thom Hall, MD [facility] Electronically Signed   By: Thom Hall M.D.   On: 10/21/2023 12:57     Medications:     Scheduled Medications:  sodium chloride    Intravenous Once   Chlorhexidine Gluconate Cloth  6 each Topical Daily   docusate  100 mg Per Tube BID   famotidine  20 mg Per Tube BID   feeding supplement (PROSource TF20)  60 mL Per Tube BID   insulin aspart  0-20 Units Subcutaneous Q4H   mouth rinse  15 mL Mouth Rinse Q2H   polyethylene glycol  17 g Per Tube Daily   sodium chloride  flush  3 mL Intravenous Q12H   thiamine  100 mg Per Tube Daily    Infusions:  albumin human Stopped (10/22/23 0548)   bivalirudin (ANGIOMAX) 250 mg in sodium chloride  0.9 % 500 mL (0.5 mg/mL) infusion 0.08 mg/kg/hr (10/22/23 0800)   calcium gluconate     feeding supplement (VITAL 1.5 CAL) 20 mL/hr at 10/22/23 0800   furosemide (LASIX) 200 mg in dextrose 5 % 100 mL (2 mg/mL) infusion 4 mg/hr (10/22/23 0800)   HYDROmorphone 4 mg/hr (10/22/23 0800)   meropenem (MERREM) IV Stopped (10/22/23 0540)    midazolam 10 mg/hr (10/22/23 0800)   potassium PHOSPHATE IVPB (in mmol)     vancomycin Stopped (10/22/23 0507)    PRN Medications: albumin human, docusate sodium, HYDROmorphone, iohexol, midazolam, ondansetron (ZOFRAN) IV, mouth rinse, mouth rinse, polyethylene glycol, sodium chloride  flush    Assessment/Plan   1. 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 CT chest with on 10/19/23 with bilateral pulmonary emboli with severe clot burden; clot is in the segmental and distal branches, not amenable to thrombectomy.  Cannulated for V-A ECMO fem-fem on 10/20/23 for RV failure. RV on 6/28 with LV EF 65-70%, D septum, RV function moderately reduced.  Stable on VA ECMO, no pressors. CVP 10-11.  - Limited echo today for RV function.  Based on echo, hopefully can begin to wean VA ECMO flow.  - Keep I/Os net negative with Lasix 4 mg/hr.  2. Acute PE: With RV failure. Bilateral, received TPA to each PA by IR.  - Continue bivalirudin.  - Echo today to reassess RV => RV mildly dilated/moderately dysfunctional.  Flow decreased to 3 L/min with no change in RV appearance and stable MAP.  Will keep at 3 L/min today, wean farther tomorrow morning and if tolerates would aim for decannulation tomorrow.  3. Uterine fibroids with bleeding: Now s/p uterine artery embolization 10/20/23. No overt bleeding, few old clots.  Hgb 8.4 today.  4. Acute hypoxemic respiratory failure: - Vent per CCM. - Empiric vancomycin/meropenem.   CRITICAL CARE Performed by: Ezra Shuck  Total critical care time: 45 minutes  Critical care time was exclusive of separately billable procedures and treating other patients.  Critical care was necessary to treat or prevent imminent or life-threatening deterioration.  Critical care was time spent personally by me on the following activities: development of treatment plan with patient and/or surrogate as well as nursing, discussions with consultants, evaluation of patient's  response to treatment, examination of patient, obtaining history from patient or surrogate, ordering and performing treatments and interventions, ordering and review of laboratory studies, ordering and review of radiographic studies, pulse oximetry and re-evaluation  of patient's condition.   Length of Stay: 2  Ezra Shuck, MD  10/22/2023, 8:25 AM  Advanced Heart Failure Team Pager [phone] (M-F; 7a - 5p)  Please contact [facility] for night-coverage after hours (5p -7a ) and weekends on [email or web address]

## 2023-10-22 NOTE — Progress Notes (Signed)
 NAME:  Anita Aguirre, MRN:  [id number], DOB:  23-Sep-1975, LOS: 2 ADMISSION DATE:  10/20/2023, CONSULTATION DATE:  10/20/2023 REFERRING MD:  Dr Isadora, CHIEF COMPLAINT:  SOB/Chest pain  History of Present Illness:  48 y/o female with PMH for ADD (attention deficit disorder), Arthritis (04/24/2008), Diffuse cystic mastopathy (04/24/2010), Hypertension, Hypothyroidism,Mastitis and bleeding uterine fibroids who presented to [facility] with worsening SOB and chest pain for last 2 days worsening day of admission to hospital  Chest pain dull and not radiating.  She was found to have submassive Pulmonary embolism with right heart strain.  Her Troponin increased to 409, Lactic Acid down to 2.7 from 3.9. CT scan chest revealed, Bilateral pulmonary emboli with severe clot burden. 2. CT evidence of RIGHT ventricular strain. Positive for acute PE with CT evidence of right heart strain (RV/LV Ratio = 1.4) consistent with at least submassive (intermediate risk) PE. The presence of right heart strain has been associated with an increased risk of morbidity and mortality. Please refer to the Code PE Focused order set in EPIC. 3. Small pulmonary infarction in the RIGHT lower lobe. 4. Moderate hiatal hernia.  Patient says she has no previous h/o blood clots but does have a h/o spontaneous abortion.  She works from home and has a sit down job and her life style is very sedentary.  No family h/o Pulmonary emboli. At [facility] she was started on Heparin drip and Milrinone drip.  She is feeling better, currently not on pressors.  Case was d/w IR by Dr Isadora and she was not a good candidate for clot extraction but maybe a better case for catheter directed Thrombolysis despite have continually bleeding for several years, She says she has been having uterine bleeding since 2013 when she had a miscarriage.  Her uterine bleeding apparently from Fibroids has worsened over the last several months and she was being  considered for Hysterectomy at [facility] but has not made it there yet. No N/V/D, no Fever/chills, no recent flights or leg trauma.  She did c/o left calf pain over the last few days and increase size of left ankle. Denies LOC. Pertinent  Medical History  ADD (attention deficit disorder), Arthritis (04/24/2008), Diffuse cystic mastopathy (04/24/2010), Hypertension, Hypothyroidism, and Mastitis.   Significant Hospital Events: Including procedures, antibiotic start and stop dates in addition to other pertinent events   6/28: transfer from [place].  Underwent VA ECMO and catheter directed thrombolysis 6/29 remain on VA ECMO, lysis catheter removed by IR.  Continue to have vaginal bleeding/clots  Interim History / Subjective:  No overnight issues, remained afebrile On VA ECMO with flow 4.6 L, sweep at 2.5 Continue to have small amount of vaginal bleeding and clots   Objective    Blood pressure 96/65, pulse 71, temperature 98.4 F (36.9 C), resp. rate 16, weight 105.9 kg, SpO2 97%. PAP: (22-34)/(12-23) 28/16 CVP:  [10 mmHg-17 mmHg] 10 mmHg  Vent Mode: PRVC FiO2 (%):  [45 %-50 %] 45 % Set Rate:  [16 bmp] 16 bmp Vt Set:  [490 mL] 490 mL PEEP:  [5 cmH20] 5 cmH20 Plateau Pressure:  [20 cmH20-21 cmH20] 20 cmH20   Intake/Output Summary (Last 24 hours) at 10/22/2023 0741 Last data filed at 10/22/2023 0739 Gross per 24 hour  Intake 2593.41 ml  Output 4220 ml  Net -1626.59 ml   Filed Weights   10/20/23 0320 10/21/23 0500 10/22/23 0500  Weight: 106.2 kg 111.4 kg 105.9 kg    Examination: General: Crtitically ill-appearing obese female,  orally intubated HEENT: [place]/AT, eyes anicteric.  ETT and cortrak in place Neuro: Sedated, not following commands.  Eyes are closed.  Pupils 3 mm bilateral reactive to light Chest: Coarse breath sounds, no wheezes or rhonchi Heart: Regular rate and rhythm, no murmurs or gallops.  Pulses are dopplerable in bilateral lower leg Abdomen: Soft, nondistended, bowel  sounds present Extremities: Bilateral ECMO cannula present   Labs and images reviewed  Patient Lines/Drains/Airways Status     Active Line/Drains/Airways     Name Placement date Placement time Site Days   Arterial Line 10/20/23 Right Radial 10/20/23  1824  Radial  2   Peripheral IV 10/19/23 20 G Right Antecubital 10/19/23  1636  Antecubital  3   CVC Triple Lumen 10/20/23 Right Internal jugular 10/20/23  0600  -- 2   Peripheral IV (Ped) 10/19/23 20 G Hand 10/19/23  1637  -- 3   ECMO Drainage Single Lumen Cannula 10/20/23  1816  -- 2   ECMO Return Single Lumen Cannula 10/20/23  1816  -- 2   NG/OG Vented/Dual Lumen 14 Fr. Oral Marking at nare/corner of mouth 62 cm 10/21/23  0130  Oral  1   Urethral Catheter Lauraine Letters, RN Temperature probe 14 Fr. 10/19/23  2230  Temperature probe  3   Airway 7.5 mm 10/20/23  1804  -- 2   Pulmonary Artery Catheter Left --  --  -- --         Resolved problem list  Lactic acidosis SIRS response from ECMO cannulation, improved Acute kidney injury due to cardiorenal syndrome, improving  Assessment and Plan  Acute bilateral submassive PE with right heart strain receiving catheter directed thrombolysis Acute RV failure with cardiogenic shock status post VA ECMO on 6/28 Acute respiratory failure with hypoxia Right upper lobe lung infarction Vaginal bleeding status post uterine artery embolization Acute blood loss anemia from ECMO cannulation and vaginal bleeding Hypothyroidism Obesity Hypokalemia/hypophosphatemia   Pulmonary artery catheters were removed after she completed catheter directed thrombolysis by IR Remain on VA ECMO with 4.7 L flow, sweep of 2.5 Continue bival with PTT goal 50-70, currently in 60s, at goal Will repeat echocardiogram today to see RV function, if RV function looks good, will start weaning trial and possible decannulation tomorrow or day after Continue Lasix infusion at 4 mg/h Continue broad-spectrum antibiotics with  vancomycin and meropenem until ECMO cannula in place Continue to have small amount of vaginal bleeding with clots H&H is stable Monitor H&H with a goal hemoglobin 8-9 Did not require blood transfusion since yesterday Continue levothyroxine Started on tube feeds, diet and excise counseling when appropriate Continue aggressive electrolyte replacement Serum creatinine is back to baseline   Best Practice (right click and Reselect all SmartList Selections daily)   Diet/type: NPO w/ oral meds start tube feeds DVT prophylaxis Bival Pressure ulcer(s): Deferred to nursing notes GI prophylaxis: H2 blocker Lines: Central line, arterial line and ECMO cannulation, still needed Foley: Yes, still needed Code Status:  full code. Last date of goals of care discussion: 6/28 patient and her mother were updated at bedside, decision was to continue full scope of care   Labs   CBC: Recent Labs  Lab 10/20/23 0643 10/20/23 1525 10/20/23 2344 10/21/23 0055 10/21/23 0508 10/21/23 0754 10/21/23 1735 10/21/23 1813 10/21/23 2121 10/21/23 2246 10/22/23 0410 10/22/23 0413  WBC 18.0*  --  12.4*  --  10.9*  --  9.4  --   --   --  10.3  --  HGB 8.4*   < > 9.0*   < > 8.9*  8.5*   < > 8.4* 7.8* 8.2* 8.4* 8.4* 8.2*  HCT 27.3*   < > 28.3*   < > 28.3*  25.0*   < > 26.6* 23.0* 24.0* 27.1* 27.5* 24.0*  MCV 85.6  --  82.3  --  81.6  --  83.1  --   --   --  84.4  --   PLT 426*  --  249  --  233  --  192  --   --   --  178  --    < > = values in this interval not displayed.    Basic Metabolic Panel: Recent Labs  Lab 10/20/23 0643 10/20/23 1525 10/20/23 1534 10/20/23 1928 10/20/23 2325 10/20/23 2344 10/21/23 0055 10/21/23 0508 10/21/23 0754 10/21/23 1215 10/21/23 1216 10/21/23 1548 10/21/23 1601 10/21/23 1735 10/21/23 1813 10/21/23 2121 10/22/23 0410 10/22/23 0413  NA 138 135   < > 139  139   < > 138   < > 139  141   < >  --    < >  --    < > 143 145 146* 147* 146*  K 3.3* 3.4*   < >  3.7  3.7   < > 3.2*   < > 4.5  4.6   < >  --    < >  --    < > 3.5 3.6 3.5 3.7 3.7  CL 106 101  --  102  --  110  --  113*  --   --   --   --   --  110  --   --  112*  --   CO2 20* 18*  --   --   --  19*  --  16*  --   --   --   --   --  22  --   --  25  --   GLUCOSE 140* 136*  --  169*  --  131*  --  119*  --   --   --   --   --  103*  --   --  116*  --   BUN 14 14  --  13  --  12  --  11  --   --   --   --   --  9  --   --  9  --   CREATININE 1.09* 1.22*  --  1.20*  --  1.16*  --  1.05*  --   --   --   --   --  0.90  --   --  0.92  --   CALCIUM 8.6* 8.5*  --   --   --  7.6*  --  7.3*  --   --   --   --   --  7.5*  --   --  7.5*  --   MG 1.9  --   --   --   --   --   --  1.8  --  2.8*  --  2.7*  --   --   --   --  2.1  --   PHOS 4.9* 3.9  --   --   --   --   --   --   --  3.6  --  3.3  --   --   --   --  2.3*  --    < > =  values in this interval not displayed.   GFR: Estimated Creatinine Clearance: 93.6 mL/min (by C-G formula based on SCr of 0.92 mg/dL). Recent Labs  Lab 10/19/23 1559 10/19/23 1639 10/20/23 2344 10/21/23 0508 10/21/23 1735 10/21/23 1814 10/21/23 2132 10/22/23 0410 10/22/23 0413  PROCALCITON <0.10  --   --   --   --   --   --   --   --   WBC 15.2*   < > 12.4* 10.9* 9.4  --   --  10.3  --   LATICACIDVEN  --    < > 2.8* 1.2  --  2.3* 1.4  --  1.5   < > = values in this interval not displayed.    Liver Function Tests: Recent Labs  Lab 10/19/23 1559 10/20/23 1525 10/20/23 2344  AST 28  --  18  ALT 12  --  9  ALKPHOS 22*  --  14*  BILITOT 0.4  --  1.2  PROT 7.3  --  5.1*  ALBUMIN 3.3* 3.1* 2.8*   No results for input(s): LIPASE, AMYLASE in the last 168 hours. No results for input(s): AMMONIA in the last 168 hours.  ABG    Component Value Date/Time   PHART 7.407 10/22/2023 0413   PCO2ART 41.3 10/22/2023 0413   PO2ART 71 (L) 10/22/2023 0413   HCO3 26.0 10/22/2023 0413   TCO2 27 10/22/2023 0413   ACIDBASEDEF 2.0 10/21/2023 1813   O2SAT 94  10/22/2023 0413     Coagulation Profile: Recent Labs  Lab 10/19/23 1710 10/20/23 2344  INR 1.0 1.3*    Cardiac Enzymes: No results for input(s): CKTOTAL, CKMB, CKMBINDEX, TROPONINI in the last 168 hours.  HbA1C: Hgb A1c MFr Bld  Date/Time Value Ref Range Status  10/20/2023 06:43 AM 5.0 4.8 - 5.6 % Final    Comment:    (NOTE) Diagnosis of Diabetes The following HbA1c ranges recommended by the American Diabetes Association (ADA) may be used as an aid in the diagnosis of diabetes mellitus.  Hemoglobin             Suggested A1C NGSP%              Diagnosis  <5.7                   Non Diabetic  5.7-6.4                Pre-Diabetic  >6.4                   Diabetic  <7.0                   Glycemic control for                       adults with diabetes.      CBG: Recent Labs  Lab 10/21/23 1203 10/21/23 1559 10/21/23 1929 10/21/23 2316 10/22/23 0324  GLUCAP 87 91 84 84 116*    The patient is critically ill due to acute bilateral submassive PE/acute respiratory failure/cardiogenic shock status post VA ECMO.  Critical care was necessary to treat or prevent imminent or life-threatening deterioration.  Critical care was time spent personally by me on the following activities: development of treatment plan with patient and/or surrogate as well as nursing, discussions with consultants, evaluation of patient's response to treatment, examination of patient, obtaining history from patient or surrogate, ordering and performing treatments and interventions, ordering and review of  laboratory studies, ordering and review of radiographic studies, pulse oximetry, re-evaluation of patient's condition and participation in multidisciplinary rounds.   During this encounter critical care time was devoted to patient care services described in this note for 39 minutes.     Valinda Novas, MD [facility] See Amion for pager If no response to pager, please call  [phone] until 7pm After 7pm, Please call E-link [phone]

## 2023-10-22 NOTE — Progress Notes (Signed)
 PHARMACY ANTIBIOTIC CONSULT NOTE   Anita Aguirre a 48 y.o. female admitted on 6/28 with bilateral PE w/ RHS cannulated for VA ECMO 6/28 PM.  Pharmacy has been consulted for empiric coverage Vancomycin and Merrem dosing. Vancomycin random level 11 after vancomycin loading dose was given at Scr stable at 1.05, WBC 10.9, Lactate 1.2, afebrile. 6/30 vancomycin trough 12 slightly < goal on vancomycin 1gm q12h> will increase slightly 1250mg  q12h   Plan: Merrem 1g IV Q8h Vancomycin 1250mg  IV every 12 hours   Allergies:  Allergies  Allergen Reactions   Wellbutrin [Bupropion] Hives, Itching and Other (See Comments)    Skin crawling sensation    Filed Weights   10/20/23 0320 10/21/23 0500 10/22/23 0500  Weight: 106.2 kg (234 lb 2.1 oz) 111.4 kg (245 lb 9.5 oz) 105.9 kg (233 lb 7.5 oz)       Latest Ref Rng & Units 10/22/2023   11:40 AM 10/22/2023    9:37 AM 10/22/2023    9:27 AM  CBC  Hemoglobin 12.0 - 15.0 g/dL 9.5  7.8  7.9   Hematocrit 36.0 - 46.0 % 28.0  23.0  26.4     Antibiotics Given (last 72 hours)     Date/Time Action Medication Dose Rate   10/21/23 0005 New Bag/Given   vancomycin (VANCOREADY) IVPB 2000 mg/400 mL 2,000 mg 200 mL/hr   10/21/23 0022 New Bag/Given   meropenem (MERREM) 1 g in sodium chloride  0.9 % 100 mL IVPB 1 g 200 mL/hr   10/21/23 0619 New Bag/Given   meropenem (MERREM) 1 g in sodium chloride  0.9 % 100 mL IVPB 1 g 200 mL/hr   10/21/23 1327 New Bag/Given   meropenem (MERREM) 1 g in sodium chloride  0.9 % 100 mL IVPB 1 g 200 mL/hr   10/21/23 1637 New Bag/Given   vancomycin (VANCOCIN) IVPB 1000 mg/200 mL premix 1,000 mg 200 mL/hr   10/21/23 2135 New Bag/Given   meropenem (MERREM) 1 g in sodium chloride  0.9 % 100 mL IVPB 1 g 200 mL/hr   10/22/23 0403 New Bag/Given   vancomycin (VANCOCIN) IVPB 1000 mg/200 mL premix 1,000 mg 200 mL/hr   10/22/23 0510 New Bag/Given   meropenem (MERREM) 1 g in sodium chloride  0.9 % 100 mL IVPB 1 g 200 mL/hr        Antimicrobials this admission: Vancomycin 6/28 >  Merrem 6/28 >   Microbiology results: 6/28 MRSA PCR: negative   Olam Chalk Pharm.D. CPP, BCPS Clinical Pharmacist [phone] 10/22/2023 1:56 PM

## 2023-10-22 NOTE — Progress Notes (Addendum)
 Nutrition Follow-up  DOCUMENTATION CODES:   Not applicable   INTERVENTION:  Reverse Trendelenburg >10 degrees while TF infusing, HOB >30 when able to be maintained due to femoral cannulation   Continue tube feeding via OGT: Vital 1.5 at 50 ml/h (1200 ml per day) Prosource TF20 60 ml BID   Provides 1960 kcal, 121 gm protein, 917 ml free water daily    Monitor magnesium, potassium, and phosphorus BID for at least 3 days   Continue Thiamine 100 mg daily for 7 days   NUTRITION DIAGNOSIS:  Inadequate oral intake related to inability to eat as evidenced by NPO status.  GOAL:  Patient will meet greater than or equal to 90% of their needs  MONITOR:  TF tolerance, Skin, Diet advancement, Labs, I & O's  REASON FOR ASSESSMENT:  Consult Enteral/tube feeding initiation and management  ASSESSMENT:   Pt with PMH significant for: ADD, arthritis, HTN, hypothyroidism, cystic mastopathy, mastitis and bleeding of uterine fibroids. Presented to [facility] with c/o worsening SOB and chest pain x2 days. Found to have pulmonary embolism w/ R heart strain. Not a good candidate for clot extraction. Transferred to [facility] and underwent VA ECMO and catheter directed thrombolysis.  6/28 transferred to [facility], 3 units PRBCs transfused; ECMO initiated 6/29 intubated; TFs initiated 6/30 still advancing TFs, tPA completed and catheter removed; ECHO: LV EF 40-45%; RV systolic function severely reduced   No issues overnight. She remains sedated on VA ECMO and off pressors. Catheter for tPA removed. Continues on lung protective ventilation.   Patient is currently intubated on ventilator support Temp (24hrs), Avg:98.3 F (36.8 C), Min:97.9 F (36.6 C), Max:99 F (37.2 C) MAP (a-line): 74 mmHg  TFs continue to be advanced. Tolerating well. No BM yet. Abdomen soft and non-distended. UOP green in color r/t methylene blue.   Admit weight: 106.2kg  Current weight: 105.9kg +mild pitting edema (generalized)   Still  unable to discern UBW as patient unable to report and no family at bedside. Weight stable compared to admission. Noted with some edema on exam.  Intake/Output Summary (Last 24 hours) at 10/22/2023 1410 Last data filed at 10/22/2023 1300 Gross per 24 hour  Intake 3170.27 ml  Output 4515 ml  Net -1344.73 ml    Net IO Since Admission: -2,077.2 mL [10/22/23 1410]   Received calcium and phosphorus supplementation today. WBC have trended to within desirable range. Sodium high. Hgb down trending.  Drains/Lines: OGT(gastric) placed 6/29 RIJ: CVC triple lumen R radial: a-line Foley catheter UOP: 4L x24 hours   Meds: docusate, famotidine, SSI Novolog, Miralax, thiamine, IV ABX Drips: Dilaudid Versed 2g calcium gluconate 30mmol K PHOS   Labs Reviewed: Na+ 147 (H) K+ 3.7 PHOS 2.3 (L) Mg 2.7>2.1 Hgb 8.4>8.2>7.8 (L) WBC 10.9>9.4>10.3 (wdl) CBG ranges from 103-116 mg/dL over the last 24 hours HgbA1c 5.0 (09/2023)    NUTRITION - FOCUSED PHYSICAL EXAM:  Of note, patient's large body habitus, as well as edema may be masking additional/more severe muscle and fat depletions. Will re-assess as edema resolves.  Flowsheet Row Most Recent Value  Orbital Region Unable to assess  [ETT holder]  Upper Arm Region No depletion  Thoracic and Lumbar Region No depletion  Buccal Region Unable to assess  [ETT holder]  Temple Region Mild depletion  Clavicle Bone Region No depletion  Clavicle and Acromion Bone Region No depletion  Scapular Bone Region No depletion  Dorsal Hand Unable to assess  [mittens]  Patellar Region No depletion  Anterior Thigh Region No depletion  Posterior Calf Region Unable to assess  [edema]  Edema (RD Assessment) Mild  Hair Reviewed  Eyes Unable to assess  Mouth Unable to assess  Skin Reviewed  Nails Unable to assess    Diet Order:   Diet Order     None      EDUCATION NEEDS:  Not appropriate for education at this time  Skin:  Skin Assessment: Reviewed RN  Assessment  Last BM:  6/27  Height:  Ht Readings from Last 1 Encounters:  10/19/23 5' 7 (1.702 m)   Weight:  Wt Readings from Last 1 Encounters:  10/22/23 105.9 kg   Ideal Body Weight:  61.4 kg  BMI:  Body mass index is 36.57 kg/m.  Estimated Nutritional Needs:   Kcal:  1700-1900 kcals  Protein:  120-140g  Fluid:  >1.9L/day  Blair Deaner MS, RD, LDN Registered Dietitian Clinical Nutrition RD Inpatient Contact Info in Amion

## 2023-10-22 NOTE — Progress Notes (Signed)
 Patient ID: Anita Aguirre, female   DOB: 1976-01-24, 48 y.o.   MRN: [id number]  Patient was doing well initially with decreased flow to 3 L/min. However, several hours later she acute dropped flow to around 2.5 at same speed and Pven increased.  This occurred after some bucking on vent. PA pressure reading higher at 40/22, CVP reading 20.  Repeat lactate lower at 0.9.   I repeated a bedside echo, this was unchanged.  The RV was moderately dysfunctional and mildly dilated.  The IVC was small, NOT dilated.    IVC size and CVP are not matching.  Patient also noted to have some chugging.  Bivalirudin is therapeutic.    Plan:  - Will continue Lasix gtt but will give 1 albumin. If she has further chugging, will cut back on Lasix.  - Add milrinone 0.125 for RV support.  - Flow increased back to 3 L/min, ultimately requiring about the same rpms to obtain this as prior. Pvenous decreased to -20s range.  - Watch closely today.   CRITICAL CARE Performed by: Ezra Shuck  Total critical care time:  45 minutes  Critical care time was exclusive of separately billable procedures and treating other patients.  Critical care was necessary to treat or prevent imminent or life-threatening deterioration.  Critical care was time spent personally by me on the following activities: development of treatment plan with patient and/or surrogate as well as nursing, discussions with consultants, evaluation of patient's response to treatment, examination of patient, obtaining history from patient or surrogate, ordering and performing treatments and interventions, ordering and review of laboratory studies, ordering and review of radiographic studies, pulse oximetry and re-evaluation of patient's condition.

## 2023-10-22 NOTE — TOC Initial Note (Signed)
 Transition of Care [facility]) - Initial/Assessment Note    Patient Details  Name: JAKAIYA NETHERLAND MRN: [id number] Date of Birth: [birth year]  Transition of Care [facility]) CM/SW Contact:    Justina Delcia Czar, RN Phone Number: [phone] 10/22/2023, 2:11 PM  Clinical Narrative:                 Spoke to pt's mother. Pt works from home and mother has FMLA/STD paperwork to complete.  Will continue to follow up with dc needs.   Expected Discharge Plan: IP Rehab Facility Barriers to Discharge: Continued Medical Work up   Patient Goals and CMS Choice            Expected Discharge Plan and Services                                              Prior Living Arrangements/Services                       Activities of Daily Living   ADL Screening (condition at time of admission) Independently performs ADLs?: Yes (appropriate for developmental age) Is the patient deaf or have difficulty hearing?: No Does the patient have difficulty seeing, even when wearing glasses/contacts?: No Does the patient have difficulty concentrating, remembering, or making decisions?: No  Permission Sought/Granted                  Emotional Assessment   Attitude/Demeanor/Rapport: Intubated (Following Commands or Not Following Commands)          Admission diagnosis:  Acute pulmonary embolism with acute cor pulmonale, unspecified pulmonary embolism type (HCC) [I26.09] Patient Active Problem List   Diagnosis Date Noted   Acute pulmonary embolism with acute cor pulmonale, unspecified pulmonary embolism type (HCC) 10/20/2023   Cardiogenic shock (HCC) 10/20/2023   Bilateral pulmonary embolism (HCC) 10/19/2023   Myocardial injury 10/19/2023   Hypokalemia 10/19/2023   Elevated lactic acid level 10/19/2023   Leukocytosis 10/19/2023   Obesity (BMI 30-39.9) 10/19/2023   Pulmonary embolism and infarction (HCC) 10/19/2023   Hypertension    ADD (attention deficit disorder)     Hypothyroidism    Fibroid uterus 08/28/2018   Migraines 08/09/2018   GERD (gastroesophageal reflux disease) 08/09/2018   Menorrhagia with regular cycle 08/09/2018   PTSD (post-traumatic stress disorder) 01/16/2012   PCP:  Freddrick, No Pharmacy:   [id number] - [place], [place] - [address] AT [facility] OF [address] [place] [address] Phone: [phone] Fax: [phone]  [facility] - [place], [place] - [address] [place] [address] Phone: [phone] Fax: [phone]     Social Drivers of Health (SDOH) Social History: SDOH Screenings   Food Insecurity: Food Insecurity Present (10/20/2023)  Housing: High Risk (10/20/2023)  Transportation Needs: Unmet Transportation Needs (10/20/2023)  Utilities: Not At Risk (10/20/2023)  Social Connections: Unknown (10/20/2023)  Tobacco Use: Medium Risk (10/19/2023)   SDOH Interventions:     Readmission Risk Interventions     No data to display

## 2023-10-22 NOTE — Consult Note (Signed)
 Hospital Consult    Reason for Consult: Extracorporeal membrane oxygenation decannulation Requesting Physician: ICU MRN #:  [id number]  History of Present Illness: This is a 48 y.o. female placed on ECMO 628 for PE.  Vascular surgery was called as the patient has improved significantly to discuss timing for decannulation.  On exam, Rutha was resting comfortably.  She remains intubated, sedated. Arterial and reperfusion catheter left groin, venous return right groin   Past Medical History:  Diagnosis Date   ADD (attention deficit disorder)    Arthritis 04/24/2008   Diffuse cystic mastopathy 04/24/2010   LEFT    Hypertension    Hypothyroidism    Mastitis     Past Surgical History:  Procedure Laterality Date   ARTERIAL LINE INSERTION N/A 10/20/2023   Procedure: ARTERIAL LINE INSERTION;  Surgeon: Gardenia Led, DO;  Location: [facility] INVASIVE CV LAB;  Service: Cardiovascular;  Laterality: N/A;   ARTERIAL LINE INSERTION N/A 10/20/2023   Procedure: ARTERIAL LINE INSERTION;  Surgeon: Wonda Sharper, MD;  Location: [facility] INVASIVE CV LAB;  Service: Cardiovascular;  Laterality: N/A;   BREAST MASS EXCISION Left 2012   COLONOSCOPY  2005   [place] ? MD    ECMO CANNULATION N/A 10/20/2023   Procedure: ECMO CANNULATION;  Surgeon: Gardenia Led, DO;  Location: [facility] INVASIVE CV LAB;  Service: Cardiovascular;  Laterality: N/A;   INCISE AND DRAIN ABCESS  2010   IR ANGIOGRAM PELVIS SELECTIVE OR SUPRASELECTIVE  10/20/2023   IR ANGIOGRAM PULMONARY BILATERAL SELECTIVE  10/20/2023   IR ANGIOGRAM SELECTIVE EACH ADDITIONAL VESSEL  10/20/2023   IR ANGIOGRAM SELECTIVE EACH ADDITIONAL VESSEL  10/20/2023   IR ANGIOGRAM SELECTIVE EACH ADDITIONAL VESSEL  10/20/2023   IR ANGIOGRAM SELECTIVE EACH ADDITIONAL VESSEL  10/20/2023   IR EMBO TUMOR ORGAN ISCHEMIA INFARCT INC GUIDE ROADMAPPING  10/20/2023   IR INFUSION THROMBOL ARTERIAL INITIAL (MS)  10/20/2023   IR INFUSION THROMBOL ARTERIAL INITIAL (MS)  10/20/2023   IR  THROMB F/U EVAL ART/VEN FINAL DAY (MS)  10/21/2023   IR US  GUIDE VASC ACCESS RIGHT  10/20/2023   IR US  GUIDE VASC ACCESS RIGHT  10/20/2023   MASTECTOMY, PARTIAL  2013   RIGHT HEART CATH N/A 10/20/2023   Procedure: RIGHT HEART CATH;  Surgeon: Gardenia Led, DO;  Location: [facility] INVASIVE CV LAB;  Service: Cardiovascular;  Laterality: N/A;    Allergies  Allergen Reactions   Wellbutrin [Bupropion] Hives, Itching and Other (See Comments)    Skin crawling sensation    Prior to Admission medications   Medication Sig Start Date End Date Taking? Authorizing Provider  aspirin 81 MG chewable tablet Chew 81 mg by mouth daily.   Yes [provider]  Ferrous Gluconate (KP FERROUS GLUCONATE) 324 (37.5 Fe) MG TABS Take 1 tablet by mouth daily. 12/27/21  Yes [provider]  ibuprofen  (ADVIL ) 200 MG tablet Take 200 mg by mouth 2 (two) times daily as needed for headache or moderate pain (pain score 4-6).   Yes [provider]  levonorgestrel  (MIRENA ) 20 MCG/24HR IUD 1 Intra Uterine Device (1 each total) by Intrauterine route once for 1 dose. 09/26/18 12/01/23 Yes Leonce Garnette BIRCH, MD  levothyroxine (SYNTHROID) 88 MCG tablet Take 88 mcg by mouth daily before breakfast. 09/26/23  Yes [provider]  losartan-hydrochlorothiazide (HYZAAR) 50-12.5 MG tablet Take 1 tablet by mouth daily. 10/17/23  Yes [provider]  medroxyPROGESTERone  (PROVERA ) 5 MG tablet Take 5 mg by mouth See admin instructions. Take 1 tablet (5mg ) by mouth three times  daily for  5-10 days for heavy bleeding.   Yes [provider]  methylphenidate (RITALIN) 10 MG tablet Take 20 mg by mouth 2 (two) times daily. 08/06/18  Yes [provider]  SM VITAMIN D3 50 MCG (2000 UT) CAPS Take 2,000 Units by mouth daily. 09/26/23  Yes [provider]    Social History   Socioeconomic History   Marital status: Single    Spouse name: Not on file   Number of children: Not on file   Years of  education: Not on file   Highest education level: Not on file  Occupational History   Not on file  Tobacco Use   Smoking status: Former    Current packs/day: 1.00    Average packs/day: 1 pack/day for 10.0 years (10.0 ttl pk-yrs)    Types: Cigarettes   Smokeless tobacco: Never  Vaping Use   Vaping status: Every Day  Substance and Sexual Activity   Alcohol use: No   Drug use: No   Sexual activity: Not Currently    Birth control/protection: I.U.D.  Other Topics Concern   Not on file  Social History Narrative   Not on file   Social Drivers of Health   Financial Resource Strain: Not on file  Food Insecurity: Food Insecurity Present (10/20/2023)   Hunger Vital Sign    Worried About Running Out of Food in the Last Year: Sometimes true    Ran Out of Food in the Last Year: Never true  Transportation Needs: Unmet Transportation Needs (10/20/2023)   PRAPARE - Administrator, Civil Service (Medical): Yes    Lack of Transportation (Non-Medical): No  Physical Activity: Not on file  Stress: Not on file  Social Connections: Unknown (10/20/2023)   Social Connection and Isolation Panel    Frequency of Communication with Friends and Family: More than three times a week    Frequency of Social Gatherings with Friends and Family: Three times a week    Attends Religious Services: Patient declined    Active Member of Clubs or Organizations: Patient declined    Attends Banker Meetings: Patient declined    Marital Status: Patient declined  Intimate Partner Violence: Not At Risk (10/20/2023)   Humiliation, Afraid, Rape, and Kick questionnaire    Fear of Current or Ex-Partner: No    Emotionally Abused: No    Physically Abused: No    Sexually Abused: No   Family History  Problem Relation Age of Onset   Hypertension Mother    Throat cancer Paternal Aunt 35   Diabetes Paternal Grandmother     ROS: Otherwise negative unless mentioned in HPI  Physical  Examination  Vitals:   10/22/23 1515 10/22/23 1530  BP:    Pulse:    Resp: (!) 21 20  Temp: 98.4 F (36.9 C) 98.4 F (36.9 C)  SpO2:     Body mass index is 36.57 kg/m.  General: Intubated, sedated, critical Gait: Not observed HENT: Intubated Pulmonary: Intubated Cardiac: Extracorporeal membrane support with mobile variation in pulse pressure. Abdomen: soft, NT/ND, no masses Skin: without rashes Vascular Exam/Pulses: Triphasic signals in the right foot, monophasic signals in the left foot Arterial ECMO cannulas left groin, venous cannula right groin Extremities: without ischemic changes, without Gangrene , without cellulitis; without open wounds;  Musculoskeletal: no muscle wasting or atrophy  Neurologic: Sedated Psychiatric: Not observed Lymph:  Unremarkable  CBC    Component Value Date/Time   WBC 10.3 10/22/2023 0410   RBC  3.26 (L) 10/22/2023 0410   HGB 9.5 (L) 10/22/2023 1140   HGB 12.9 05/19/2012 0947   HCT 28.0 (L) 10/22/2023 1140   HCT 37.4 05/19/2012 0947   PLT 178 10/22/2023 0410   PLT 294 05/19/2012 0947   MCV 84.4 10/22/2023 0410   MCV 91 05/19/2012 0947   MCH 25.8 (L) 10/22/2023 0410   MCHC 30.5 10/22/2023 0410   RDW 20.3 (H) 10/22/2023 0410   RDW 14.7 (H) 05/19/2012 0947   LYMPHSABS 2.6 10/07/2021 1235   LYMPHSABS 2.8 05/19/2012 0947   MONOABS 0.7 10/07/2021 1235   MONOABS 0.8 05/19/2012 0947   EOSABS 0.4 10/07/2021 1235   EOSABS 0.3 05/19/2012 0947   BASOSABS 0.0 10/07/2021 1235   BASOSABS 0.1 05/19/2012 0947    BMET    Component Value Date/Time   NA 144 10/22/2023 1140   NA 143 05/19/2012 0947   K 4.5 10/22/2023 1140   K 3.8 05/19/2012 0947   CL 112 (H) 10/22/2023 0410   CL 105 05/19/2012 0947   CO2 25 10/22/2023 0410   CO2 26 05/19/2012 0947   GLUCOSE 116 (H) 10/22/2023 0410   GLUCOSE 94 05/19/2012 0947   BUN 9 10/22/2023 0410   BUN 8 05/19/2012 0947   CREATININE 0.92 10/22/2023 0410   CREATININE 0.97 05/19/2012 0947   CALCIUM  7.5 (L) 10/22/2023 0410   CALCIUM 9.3 05/19/2012 0947   GFRNONAA >60 10/22/2023 0410   GFRNONAA >60 05/19/2012 0947   GFRAA >60 08/07/2018 2007   GFRAA >60 05/19/2012 0947    COAGS: Lab Results  Component Value Date   INR 1.3 (H) 10/20/2023   INR 1.0 10/19/2023   INR 1.0 12/06/2006     ASSESSMENT/PLAN: This is a 48 y.o. female currently on extracorporeal membrane oxygenation in need of decannulation in the coming days.  After discussing with heart failure and critical care, this will likely occur on Wednesday pending continued improvement.  Will continue to follow closely.  Fonda FORBES Rim MD MS [facility] [phone] 10/22/2023  4:01 PM

## 2023-10-23 ENCOUNTER — Inpatient Hospital Stay (HOSPITAL_COMMUNITY): Payer: MEDICAID

## 2023-10-23 DIAGNOSIS — I2609 Other pulmonary embolism with acute cor pulmonale: Secondary | ICD-10-CM

## 2023-10-23 DIAGNOSIS — N939 Abnormal uterine and vaginal bleeding, unspecified: Secondary | ICD-10-CM

## 2023-10-23 DIAGNOSIS — J9601 Acute respiratory failure with hypoxia: Secondary | ICD-10-CM

## 2023-10-23 LAB — TYPE AND SCREEN
ABO/RH(D): O POS
Antibody Screen: NEGATIVE
Unit division: 0
Unit division: 0
Unit division: 0
Unit division: 0
Unit division: 0
Unit division: 0
Unit division: 0
Unit division: 0
Unit division: 0
Unit division: 0
Unit division: 0
Unit division: 0
Unit division: 0

## 2023-10-23 LAB — POCT I-STAT 7, (LYTES, BLD GAS, ICA,H+H)
Acid-Base Excess: 1 mmol/L (ref 0.0–2.0)
Acid-Base Excess: 1 mmol/L (ref 0.0–2.0)
Acid-Base Excess: 2 mmol/L (ref 0.0–2.0)
Acid-Base Excess: 2 mmol/L (ref 0.0–2.0)
Acid-Base Excess: 2 mmol/L (ref 0.0–2.0)
Acid-Base Excess: 2 mmol/L (ref 0.0–2.0)
Acid-Base Excess: 3 mmol/L — ABNORMAL HIGH (ref 0.0–2.0)
Acid-Base Excess: 3 mmol/L — ABNORMAL HIGH (ref 0.0–2.0)
Acid-Base Excess: 3 mmol/L — ABNORMAL HIGH (ref 0.0–2.0)
Acid-Base Excess: 3 mmol/L — ABNORMAL HIGH (ref 0.0–2.0)
Acid-Base Excess: 4 mmol/L — ABNORMAL HIGH (ref 0.0–2.0)
Bicarbonate: 25 mmol/L (ref 20.0–28.0)
Bicarbonate: 25.1 mmol/L (ref 20.0–28.0)
Bicarbonate: 25.6 mmol/L (ref 20.0–28.0)
Bicarbonate: 26.2 mmol/L (ref 20.0–28.0)
Bicarbonate: 26.4 mmol/L (ref 20.0–28.0)
Bicarbonate: 26.7 mmol/L (ref 20.0–28.0)
Bicarbonate: 26.8 mmol/L (ref 20.0–28.0)
Bicarbonate: 27.1 mmol/L (ref 20.0–28.0)
Bicarbonate: 27.4 mmol/L (ref 20.0–28.0)
Bicarbonate: 27.6 mmol/L (ref 20.0–28.0)
Bicarbonate: 27.7 mmol/L (ref 20.0–28.0)
Calcium, Ion: 1.16 mmol/L (ref 1.15–1.40)
Calcium, Ion: 1.17 mmol/L (ref 1.15–1.40)
Calcium, Ion: 1.17 mmol/L (ref 1.15–1.40)
Calcium, Ion: 1.17 mmol/L (ref 1.15–1.40)
Calcium, Ion: 1.18 mmol/L (ref 1.15–1.40)
Calcium, Ion: 1.18 mmol/L (ref 1.15–1.40)
Calcium, Ion: 1.19 mmol/L (ref 1.15–1.40)
Calcium, Ion: 1.19 mmol/L (ref 1.15–1.40)
Calcium, Ion: 1.19 mmol/L (ref 1.15–1.40)
Calcium, Ion: 1.19 mmol/L (ref 1.15–1.40)
Calcium, Ion: 1.2 mmol/L (ref 1.15–1.40)
HCT: 22 % — ABNORMAL LOW (ref 36.0–46.0)
HCT: 22 % — ABNORMAL LOW (ref 36.0–46.0)
HCT: 22 % — ABNORMAL LOW (ref 36.0–46.0)
HCT: 22 % — ABNORMAL LOW (ref 36.0–46.0)
HCT: 23 % — ABNORMAL LOW (ref 36.0–46.0)
HCT: 23 % — ABNORMAL LOW (ref 36.0–46.0)
HCT: 23 % — ABNORMAL LOW (ref 36.0–46.0)
HCT: 23 % — ABNORMAL LOW (ref 36.0–46.0)
HCT: 25 % — ABNORMAL LOW (ref 36.0–46.0)
HCT: 25 % — ABNORMAL LOW (ref 36.0–46.0)
HCT: 26 % — ABNORMAL LOW (ref 36.0–46.0)
Hemoglobin: 7.5 g/dL — ABNORMAL LOW (ref 12.0–15.0)
Hemoglobin: 7.5 g/dL — ABNORMAL LOW (ref 12.0–15.0)
Hemoglobin: 7.5 g/dL — ABNORMAL LOW (ref 12.0–15.0)
Hemoglobin: 7.5 g/dL — ABNORMAL LOW (ref 12.0–15.0)
Hemoglobin: 7.8 g/dL — ABNORMAL LOW (ref 12.0–15.0)
Hemoglobin: 7.8 g/dL — ABNORMAL LOW (ref 12.0–15.0)
Hemoglobin: 7.8 g/dL — ABNORMAL LOW (ref 12.0–15.0)
Hemoglobin: 7.8 g/dL — ABNORMAL LOW (ref 12.0–15.0)
Hemoglobin: 8.5 g/dL — ABNORMAL LOW (ref 12.0–15.0)
Hemoglobin: 8.5 g/dL — ABNORMAL LOW (ref 12.0–15.0)
Hemoglobin: 8.8 g/dL — ABNORMAL LOW (ref 12.0–15.0)
O2 Saturation: 100 %
O2 Saturation: 100 %
O2 Saturation: 93 %
O2 Saturation: 93 %
O2 Saturation: 94 %
O2 Saturation: 94 %
O2 Saturation: 95 %
O2 Saturation: 97 %
O2 Saturation: 97 %
O2 Saturation: 99 %
O2 Saturation: 99 %
Patient temperature: 36.6
Patient temperature: 36.6
Patient temperature: 36.6
Patient temperature: 36.6
Patient temperature: 36.6
Patient temperature: 36.7
Patient temperature: 36.7
Patient temperature: 36.8
Patient temperature: 36.8
Patient temperature: 36.8
Patient temperature: 36.9
Potassium: 3.7 mmol/L (ref 3.5–5.1)
Potassium: 3.7 mmol/L (ref 3.5–5.1)
Potassium: 3.7 mmol/L (ref 3.5–5.1)
Potassium: 3.8 mmol/L (ref 3.5–5.1)
Potassium: 3.8 mmol/L (ref 3.5–5.1)
Potassium: 3.8 mmol/L (ref 3.5–5.1)
Potassium: 3.8 mmol/L (ref 3.5–5.1)
Potassium: 3.9 mmol/L (ref 3.5–5.1)
Potassium: 3.9 mmol/L (ref 3.5–5.1)
Potassium: 3.9 mmol/L (ref 3.5–5.1)
Potassium: 4 mmol/L (ref 3.5–5.1)
Sodium: 146 mmol/L — ABNORMAL HIGH (ref 135–145)
Sodium: 146 mmol/L — ABNORMAL HIGH (ref 135–145)
Sodium: 147 mmol/L — ABNORMAL HIGH (ref 135–145)
Sodium: 147 mmol/L — ABNORMAL HIGH (ref 135–145)
Sodium: 147 mmol/L — ABNORMAL HIGH (ref 135–145)
Sodium: 147 mmol/L — ABNORMAL HIGH (ref 135–145)
Sodium: 147 mmol/L — ABNORMAL HIGH (ref 135–145)
Sodium: 147 mmol/L — ABNORMAL HIGH (ref 135–145)
Sodium: 147 mmol/L — ABNORMAL HIGH (ref 135–145)
Sodium: 148 mmol/L — ABNORMAL HIGH (ref 135–145)
Sodium: 149 mmol/L — ABNORMAL HIGH (ref 135–145)
TCO2: 26 mmol/L (ref 22–32)
TCO2: 26 mmol/L (ref 22–32)
TCO2: 27 mmol/L (ref 22–32)
TCO2: 27 mmol/L (ref 22–32)
TCO2: 28 mmol/L (ref 22–32)
TCO2: 28 mmol/L (ref 22–32)
TCO2: 28 mmol/L (ref 22–32)
TCO2: 28 mmol/L (ref 22–32)
TCO2: 29 mmol/L (ref 22–32)
TCO2: 29 mmol/L (ref 22–32)
TCO2: 29 mmol/L (ref 22–32)
pCO2 arterial: 34.4 mmHg (ref 32–48)
pCO2 arterial: 35.6 mmHg (ref 32–48)
pCO2 arterial: 35.8 mmHg (ref 32–48)
pCO2 arterial: 36 mmHg (ref 32–48)
pCO2 arterial: 36.1 mmHg (ref 32–48)
pCO2 arterial: 38.1 mmHg (ref 32–48)
pCO2 arterial: 38.2 mmHg (ref 32–48)
pCO2 arterial: 38.3 mmHg (ref 32–48)
pCO2 arterial: 38.9 mmHg (ref 32–48)
pCO2 arterial: 42 mmHg (ref 32–48)
pCO2 arterial: 42.2 mmHg (ref 32–48)
pH, Arterial: 7.41 (ref 7.35–7.45)
pH, Arterial: 7.423 (ref 7.35–7.45)
pH, Arterial: 7.438 (ref 7.35–7.45)
pH, Arterial: 7.452 — ABNORMAL HIGH (ref 7.35–7.45)
pH, Arterial: 7.454 — ABNORMAL HIGH (ref 7.35–7.45)
pH, Arterial: 7.457 — ABNORMAL HIGH (ref 7.35–7.45)
pH, Arterial: 7.459 — ABNORMAL HIGH (ref 7.35–7.45)
pH, Arterial: 7.464 — ABNORMAL HIGH (ref 7.35–7.45)
pH, Arterial: 7.466 — ABNORMAL HIGH (ref 7.35–7.45)
pH, Arterial: 7.467 — ABNORMAL HIGH (ref 7.35–7.45)
pH, Arterial: 7.499 — ABNORMAL HIGH (ref 7.35–7.45)
pO2, Arterial: 111 mmHg — ABNORMAL HIGH (ref 83–108)
pO2, Arterial: 133 mmHg — ABNORMAL HIGH (ref 83–108)
pO2, Arterial: 171 mmHg — ABNORMAL HIGH (ref 83–108)
pO2, Arterial: 189 mmHg — ABNORMAL HIGH (ref 83–108)
pO2, Arterial: 61 mmHg — ABNORMAL LOW (ref 83–108)
pO2, Arterial: 63 mmHg — ABNORMAL LOW (ref 83–108)
pO2, Arterial: 65 mmHg — ABNORMAL LOW (ref 83–108)
pO2, Arterial: 67 mmHg — ABNORMAL LOW (ref 83–108)
pO2, Arterial: 75 mmHg — ABNORMAL LOW (ref 83–108)
pO2, Arterial: 80 mmHg — ABNORMAL LOW (ref 83–108)
pO2, Arterial: 82 mmHg — ABNORMAL LOW (ref 83–108)

## 2023-10-23 LAB — BPAM RBC
Blood Product Expiration Date: 202507282359
Blood Product Expiration Date: 202508052359
Blood Product Expiration Date: 202507252359
Blood Product Expiration Date: 202507272359
Blood Product Expiration Date: 202507272359
Blood Product Expiration Date: 202507272359
Blood Product Expiration Date: 202507272359
Blood Product Expiration Date: 202507282359
Blood Product Expiration Date: 202507282359
Blood Product Expiration Date: 202507282359
Blood Product Expiration Date: 202507282359
Blood Product Expiration Date: 202507282359
ISSUE DATE / TIME: 202506281724
ISSUE DATE / TIME: 202506281724
ISSUE DATE / TIME: 202506281724
ISSUE DATE / TIME: 202506281937
ISSUE DATE / TIME: 202506281937
ISSUE DATE / TIME: 202506281937
ISSUE DATE / TIME: 202506290912
ISSUE DATE / TIME: 202506291009
ISSUE DATE / TIME: 202506291009
ISSUE DATE / TIME: 202506291149
ISSUE DATE / TIME: 202506301753
ISSUE DATE / TIME: 202507282359
PRODUCT CODE: 202506291009
Unit Type and Rh: 5100
Unit Type and Rh: 202507282359
Unit Type and Rh: 202507282359
Unit Type and Rh: 5100
Unit Type and Rh: 5100
Unit Type and Rh: 5100
Unit Type and Rh: 5100
Unit Type and Rh: 5100
Unit Type and Rh: 5100
Unit Type and Rh: 5100
Unit Type and Rh: 5100
Unit Type and Rh: 5100
Unit Type and Rh: 5100
Unit Type and Rh: 5100
Unit Type and Rh: 5100

## 2023-10-23 LAB — BASIC METABOLIC PANEL WITH GFR
Anion gap: 4 — ABNORMAL LOW (ref 5–15)
Anion gap: 11 (ref 5–15)
BUN: 16 mg/dL (ref 6–20)
BUN: 15 mg/dL (ref 6–20)
CO2: 32 mmol/L (ref 22–32)
CO2: 26 mmol/L (ref 22–32)
Calcium: 8.3 mg/dL — ABNORMAL LOW (ref 8.9–10.3)
Calcium: 8.8 mg/dL — ABNORMAL LOW (ref 8.9–10.3)
Chloride: 109 mmol/L (ref 98–111)
Chloride: 107 mmol/L (ref 98–111)
Creatinine, Ser: 0.87 mg/dL (ref 0.44–1.00)
Creatinine, Ser: 0.92 mg/dL (ref 0.44–1.00)
GFR, Estimated: >60 mL/min (ref >60)
GFR, Estimated: >60 mL/min (ref >60)
Glucose, Bld: 160 mg/dL — ABNORMAL HIGH (ref 70–99)
Glucose, Bld: 121 mg/dL — ABNORMAL HIGH (ref 70–99)
Potassium: 3.7 mmol/L (ref 3.5–5.1)
Potassium: 3.7 mmol/L (ref 3.5–5.1)
Sodium: 145 mmol/L (ref 135–145)
Sodium: 144 mmol/L (ref 135–145)

## 2023-10-23 LAB — CG4 I-STAT (LACTIC ACID)
Lactic Acid, Venous: 1.2 mmol/L (ref 0.5–1.9)
Lactic Acid, Venous: 1.9 mmol/L (ref 0.5–1.9)

## 2023-10-23 LAB — ANTIPHOSPHOLIPID SYNDROME EVAL, BLD
Anticardiolipin IgA: <9 U/mL (ref 0–11)
Anticardiolipin IgG: <9 GPL U/mL (ref 0–14)
Anticardiolipin IgM: 9 [MPL'U]/mL (ref 0–12)
DRVVT: 37.3 s (ref 0.0–47.0)
PTT Lupus Anticoagulant: 38 s (ref 0.0–43.5)
Phosphatydalserine, IgA: 3 {APS'U} (ref 0–19)
Phosphatydalserine, IgG: <9 U (ref 0–30)
Phosphatydalserine, IgM: 21 U (ref 0–30)

## 2023-10-23 LAB — LACTATE DEHYDROGENASE: LDH: 361 U/L — ABNORMAL HIGH (ref 98–192)

## 2023-10-23 LAB — COOXEMETRY PANEL
Carboxyhemoglobin: 2.6 % — ABNORMAL HIGH (ref 0.5–1.5)
Methemoglobin: 0.8 % (ref 0.0–1.5)
O2 Saturation: 80.7 %
Total hemoglobin: 8 g/dL — ABNORMAL LOW (ref 12.0–16.0)

## 2023-10-23 LAB — CBC
HCT: 25.9 % — ABNORMAL LOW (ref 36.0–46.0)
HCT: 26.4 % — ABNORMAL LOW (ref 36.0–46.0)
Hemoglobin: 8 g/dL — ABNORMAL LOW (ref 12.0–15.0)
Hemoglobin: 8.2 g/dL — ABNORMAL LOW (ref 12.0–15.0)
MCH: 26.8 pg (ref 26.0–34.0)
MCH: 26.9 pg (ref 26.0–34.0)
MCHC: 30.9 g/dL (ref 30.0–36.0)
MCHC: 31.1 g/dL (ref 30.0–36.0)
MCV: 86.9 fL (ref 80.0–100.0)
MCV: 86.6 fL (ref 80.0–100.0)
Platelets: 146 10*3/uL — ABNORMAL LOW (ref 150–400)
Platelets: 149 10*3/uL — ABNORMAL LOW (ref 150–400)
RBC: 2.98 MIL/uL — ABNORMAL LOW (ref 3.87–5.11)
RBC: 3.05 MIL/uL — ABNORMAL LOW (ref 3.87–5.11)
RDW: 19.2 % — ABNORMAL HIGH (ref 11.5–15.5)
RDW: 19.5 % — ABNORMAL HIGH (ref 11.5–15.5)
WBC: 17.8 10*3/uL — ABNORMAL HIGH (ref 4.0–10.5)
WBC: 20.5 10*3/uL — ABNORMAL HIGH (ref 4.0–10.5)
nRBC: 0.1 % (ref 0.0–0.2)
nRBC: 0.1 % (ref 0.0–0.2)

## 2023-10-23 LAB — APTT
aPTT: 64 s — ABNORMAL HIGH (ref 24–36)
aPTT: 72 s — ABNORMAL HIGH (ref 24–36)
aPTT: 63 s — ABNORMAL HIGH (ref 24–36)
aPTT: 62 s — ABNORMAL HIGH (ref 24–36)

## 2023-10-23 LAB — GLUCOSE, CAPILLARY
Glucose-Capillary: 108 mg/dL — ABNORMAL HIGH (ref 70–99)
Glucose-Capillary: 114 mg/dL — ABNORMAL HIGH (ref 70–99)
Glucose-Capillary: 121 mg/dL — ABNORMAL HIGH (ref 70–99)
Glucose-Capillary: 130 mg/dL — ABNORMAL HIGH (ref 70–99)
Glucose-Capillary: 161 mg/dL — ABNORMAL HIGH (ref 70–99)
Glucose-Capillary: 162 mg/dL — ABNORMAL HIGH (ref 70–99)
Glucose-Capillary: 87 mg/dL (ref 70–99)

## 2023-10-23 LAB — PREPARE RBC (CROSSMATCH)

## 2023-10-23 LAB — HEMOGLOBIN AND HEMATOCRIT, BLOOD
HCT: 26 % — ABNORMAL LOW (ref 36.0–46.0)
Hemoglobin: 7.9 g/dL — ABNORMAL LOW (ref 12.0–15.0)

## 2023-10-23 LAB — ECHOCARDIOGRAM LIMITED: Weight: 3876.57 [oz_av]

## 2023-10-23 LAB — POCT ACTIVATED CLOTTING TIME
Activated Clotting Time: 187 s
Activated Clotting Time: 199 s

## 2023-10-23 LAB — MAGNESIUM: Magnesium: 2.1 mg/dL (ref 1.7–2.4)

## 2023-10-23 LAB — FIBRINOGEN: Fibrinogen: 504 mg/dL — ABNORMAL HIGH (ref 210–475)

## 2023-10-23 MED ORDER — POTASSIUM CHLORIDE 20 MEQ PO PACK
60.0000 meq | PACK | Freq: Once | ORAL | Status: AC
  Administered 2023-10-23: 60 meq
  Filled 2023-10-23: qty 3

## 2023-10-23 MED ORDER — POLYETHYLENE GLYCOL 3350 17 G PO PACK
17.0000 g | PACK | Freq: Two times a day (BID) | ORAL | Status: DC
  Administered 2023-10-23 – 2023-10-25 (×4): 17 g
  Filled 2023-10-23 (×4): qty 1

## 2023-10-23 MED ORDER — POTASSIUM CHLORIDE 20 MEQ PO PACK
40.0000 meq | PACK | Freq: Once | ORAL | Status: AC
  Administered 2023-10-23: 40 meq
  Filled 2023-10-23: qty 2

## 2023-10-23 MED ORDER — ROCURONIUM BROMIDE 10 MG/ML (PF) SYRINGE
100.0000 mg | PREFILLED_SYRINGE | Freq: Once | INTRAVENOUS | Status: AC
  Administered 2023-10-23: 100 mg via INTRAVENOUS
  Filled 2023-10-23: qty 10

## 2023-10-23 MED ORDER — POTASSIUM CHLORIDE 10 MEQ/50ML IV SOLN
10.0000 meq | INTRAVENOUS | Status: AC
  Administered 2023-10-23 – 2023-10-24 (×4): 10 meq via INTRAVENOUS
  Filled 2023-10-23 (×4): qty 50

## 2023-10-23 MED ORDER — HEPARIN SODIUM (PORCINE) 1000 UNIT/ML IJ SOLN: 2000.0000 [IU] | Freq: Once | INTRAMUSCULAR | Status: DC

## 2023-10-23 MED ORDER — LEVOTHYROXINE SODIUM 88 MCG PO TABS
88.0000 ug | ORAL_TABLET | Freq: Every day | ORAL | Status: DC
  Administered 2023-10-24 – 2023-10-28 (×5): 88 ug
  Filled 2023-10-23 (×6): qty 1

## 2023-10-23 MED ORDER — ALBUTEROL SULFATE (2.5 MG/3ML) 0.083% IN NEBU
2.5000 mg | INHALATION_SOLUTION | RESPIRATORY_TRACT | Status: DC | PRN
  Administered 2023-10-23 – 2023-10-27 (×4): 2.5 mg via RESPIRATORY_TRACT
  Filled 2023-10-23 (×4): qty 3

## 2023-10-23 MED ORDER — SENNOSIDES-DOCUSATE SODIUM 8.6-50 MG PO TABS
2.0000 | ORAL_TABLET | Freq: Every day | ORAL | Status: DC
  Administered 2023-10-23 – 2023-10-25 (×2): 2
  Filled 2023-10-23 (×2): qty 2

## 2023-10-23 MED ORDER — METOCLOPRAMIDE HCL 5 MG/ML IJ SOLN
10.0000 mg | Freq: Four times a day (QID) | INTRAMUSCULAR | Status: AC
  Administered 2023-10-23 – 2023-10-24 (×4): 10 mg via INTRAVENOUS
  Filled 2023-10-23 (×4): qty 2

## 2023-10-23 MED ORDER — SODIUM CHLORIDE 0.9% IV SOLUTION: Freq: Once | INTRAVENOUS | Status: DC

## 2023-10-23 MED ORDER — HEPARIN SODIUM (PORCINE) 1000 UNIT/ML IJ SOLN
2000.0000 [IU] | Freq: Once | INTRAMUSCULAR | Status: AC
  Administered 2023-10-23: 2000 [IU] via INTRAVENOUS
  Filled 2023-10-23: qty 2

## 2023-10-23 MED ORDER — LACTULOSE 10 GM/15ML PO SOLN
20.0000 g | Freq: Three times a day (TID) | ORAL | Status: DC
  Administered 2023-10-23 – 2023-10-25 (×7): 20 g
  Filled 2023-10-23 (×7): qty 30

## 2023-10-23 NOTE — Progress Notes (Signed)
 NAME:  Anita Aguirre, MRN:  [id number], DOB:  [birth year], LOS: 3 ADMISSION DATE:  10/20/2023, CONSULTATION DATE:  10/20/2023 REFERRING MD:  Dr Isadora, CHIEF COMPLAINT:  SOB/Chest pain  History of Present Illness:  48 y/o female with PMH for ADD (attention deficit disorder), Arthritis (04/24/2008), Diffuse cystic mastopathy (04/24/2010), Hypertension, Hypothyroidism,Mastitis and bleeding uterine fibroids who presented to [facility] with worsening SOB and chest pain for last 2 days worsening day of admission to hospital  Chest pain dull and not radiating.  She was found to have submassive Pulmonary embolism with right heart strain.  Her Troponin increased to 409, Lactic Acid down to 2.7 from 3.9. CT scan chest revealed, Bilateral pulmonary emboli with severe clot burden. 2. CT evidence of RIGHT ventricular strain. Positive for acute PE with CT evidence of right heart strain (RV/LV Ratio = 1.4) consistent with at least submassive (intermediate risk) PE. The presence of right heart strain has been associated with an increased risk of morbidity and mortality. Please refer to the Code PE Focused order set in EPIC. 3. Small pulmonary infarction in the RIGHT lower lobe. 4. Moderate hiatal hernia.  Patient says she has no previous h/o blood clots but does have a h/o spontaneous abortion.  She works from home and has a sit down job and her life style is very sedentary.  No family h/o Pulmonary emboli. At [facility] she was started on Heparin drip and Milrinone drip.  She is feeling better, currently not on pressors.  Case was d/w IR by Dr Isadora and she was not a good candidate for clot extraction but maybe a better case for catheter directed Thrombolysis despite have continually bleeding for several years, She says she has been having uterine bleeding since 2013 when she had a miscarriage.  Her uterine bleeding apparently from Fibroids has worsened over the last several months and she was being  considered for Hysterectomy at [facility] but has not made it there yet. No N/V/D, no Fever/chills, no recent flights or leg trauma.  She did c/o left calf pain over the last few days and increase size of left ankle. Denies LOC. Pertinent  Medical History  ADD (attention deficit disorder), Arthritis (04/24/2008), Diffuse cystic mastopathy (04/24/2010), Hypertension, Hypothyroidism, and Mastitis.   Significant Hospital Events: Including procedures, antibiotic start and stop dates in addition to other pertinent events   6/28: transfer from [place].  Underwent VA ECMO and catheter directed thrombolysis 6/29 remain on VA ECMO, lysis catheter removed by IR.  Continue to have vaginal bleeding/clots 6/30 ECMO flow was decreased to 3 L, patient tolerated well, bedside echocardiogram showed mildly reduced RV function compared to prior.  Continued to have vaginal bleeding/clots  Interim History / Subjective:  Patient vomited and aspirated overnight, FiO2 went up to 100% ECMO flow remain at 3 L with sweep of 2 Remain afebrile  Objective    Blood pressure (!) 108/54, pulse 81, temperature 97.9 F (36.6 C), resp. rate 20, weight 109.9 kg, SpO2 100%. PAP: (28-44)/(13-33) 29/13 CVP:  [6 mmHg-24 mmHg] 8 mmHg  Vent Mode: PRVC FiO2 (%):  [45 %-100 %] 90 % Set Rate:  [16 bmp-20 bmp] 20 bmp Vt Set:  [490 mL] 490 mL PEEP:  [5 cmH20-10 cmH20] 10 cmH20 Plateau Pressure:  [16 cmH20-23 cmH20] 23 cmH20   Intake/Output Summary (Last 24 hours) at 10/23/2023 0854 Last data filed at 10/23/2023 0800 Gross per 24 hour  Intake 3915.41 ml  Output 3225 ml  Net 690.41 ml   [name]  Weights   10/21/23 0500 10/22/23 0500 10/23/23 0600  Weight: 111.4 kg 105.9 kg 109.9 kg    Examination: General: Crtitically ill-appearing female, orally intubated HEENT: [place]/AT, eyes anicteric.  ETT and  OGT in place Neuro: Sedated, not following commands.  Eyes are closed.  Pupils 3 mm bilateral reactive to light Chest: Coarse breath  sounds, no wheezes or rhonchi Heart: Regular rate and rhythm, no murmurs or gallops Abdomen: Soft, nondistended, bowel sounds present Extremities: Bilateral ECMO cannula noted, no bleeding around cannula site Groin: Patient continued to have bleeding per vagina with clots  Labs and images reviewed  Patient Lines/Drains/Airways Status     Active Line/Drains/Airways     Name Placement date Placement time Site Days   Arterial Line 10/20/23 Right Radial 10/20/23  1824  Radial  3   CVC Triple Lumen 10/20/23 Right Internal jugular 10/20/23  0600  -- 3   Peripheral IV (Ped) 10/19/23 20 G Hand 10/19/23  1637  -- 4   ECMO Drainage Single Lumen Cannula 10/20/23  1816  -- 3   ECMO Return Single Lumen Cannula 10/20/23  1816  -- 3   NG/OG Vented/Dual Lumen 14 Fr. Oral Marking at nare/corner of mouth 62 cm 10/21/23  0130  Oral  2   Urethral Catheter Lauraine Letters, RN Temperature probe 14 Fr. 10/19/23  2230  Temperature probe  4   Airway 7.5 mm 10/20/23  1804  -- 3   Pulmonary Artery Catheter Left --  --  -- --        Resolved problem list  Lactic acidosis SIRS response from ECMO cannulation, improved Acute kidney injury due to cardiorenal syndrome, improving Hypokalemia/hypophosphatemia  Assessment and Plan  Acute bilateral submassive PE with right heart strain status post catheter directed thrombolysis Acute RV failure with cardiogenic shock status post VA ECMO on 6/28 Acute respiratory failure with hypoxia Right upper lobe lung infarction Vaginal bleeding status post uterine artery embolization, continue to bleed Acute blood loss anemia from ECMO cannulation and vaginal bleeding Hypothyroidism Obesity  Patient remain on VA ECMO, ECMO flow was decreased to 3 L and sweep of 2 Plan to decrease ECMO flow to 2 L while doing echocardiogram to see RV function Yesterday RV function looked improved from prior, now mildly dilated with reduced function Continue anticoagulation with Bival with  PTT goal 50-70 Continue Lasix infusion at 4 mg/h Overnight she vomited and aspirated into airway leading to hypoxia Bronc was done, sample was sent for culture Continue lung protective ventilation Patient is currently on 100% FiO2 and PEEP of 10 Trend ABG and titrate FiO2 with PaO2 goal 60-80 Continue broad-spectrum antibiotics with vancomycin and meropenem OB/GYN was called because patient continued to have vaginal bleeding Monitor H&H and transfuse if less than 8 Continue levothyroxine Diet and exercise counseling when appropriate   Best Practice (right click and Reselect all SmartList Selections daily)   Diet/type: NPO w/ oral meds  DVT prophylaxis Bival Pressure ulcer(s): Deferred to nursing notes GI prophylaxis: H2 blocker Lines: Central line, arterial line and ECMO cannulation, still needed Foley: Yes, still needed Code Status:  full code. Last date of goals of care discussion: 6/28 patient and her mother were updated at bedside, decision was to continue full scope of care   Labs   CBC: Recent Labs  Lab 10/20/23 2344 10/21/23 0055 10/21/23 9491 10/21/23 0754 10/21/23 1735 10/21/23 1813 10/22/23 0410 10/22/23 0413 10/23/23 9641 10/23/23 0405 10/23/23 0518 10/23/23 9454 10/23/23 9390 10/23/23 9356 10/23/23 9270  WBC 12.4*  --  10.9*  --  9.4  --  10.3  --  17.8*  --   --   --   --   --   --   HGB 9.0*   < > 8.9*  8.5*   < > 8.4*   < > 8.4*   < > 8.0*   < > 7.8* 7.8* 7.5* 7.5* 7.5*  HCT 28.3*   < > 28.3*  25.0*   < > 26.6*   < > 27.5*   < > 25.9*   < > 23.0* 23.0* 22.0* 22.0* 22.0*  MCV 82.3  --  81.6  --  83.1  --  84.4  --  86.9  --   --   --   --   --   --   PLT 249  --  233  --  192  --  178  --  146*  --   --   --   --   --   --    < > = values in this interval not displayed.    Basic Metabolic Panel: Recent Labs  Lab 10/20/23 1525 10/20/23 1534 10/21/23 0508 10/21/23 0754 10/21/23 1215 10/21/23 1216 10/21/23 1548 10/21/23 1601 10/21/23 1735  10/21/23 1813 10/22/23 0410 10/22/23 0413 10/22/23 1618 10/22/23 1630 10/23/23 0358 10/23/23 0405 10/23/23 0518 10/23/23 0545 10/23/23 0609 10/23/23 0643 10/23/23 0729  NA 135   < > 139  141   < >  --    < >  --    < > 143   < > 147*   < > 144   < > 145   < > 146* 146* 147* 147* 147*  K 3.4*   < > 4.5  4.6   < >  --    < >  --    < > 3.5   < > 3.7   < > 4.2   < > 3.7   < > 3.8 3.9 3.9 3.9 4.0  CL 101   < > 113*  --   --   --   --   --  110  --  112*  --  108  --  109  --   --   --   --   --   --   CO2 18*   < > 16*  --   --   --   --   --  22  --  25  --  25  --  32  --   --   --   --   --   --   GLUCOSE 136*   < > 119*  --   --   --   --   --  103*  --  116*  --  96  --  160*  --   --   --   --   --   --   BUN 14   < > 11  --   --   --   --   --  9  --  9  --  11  --  16  --   --   --   --   --   --   CREATININE 1.22*   < > 1.05*  --   --   --   --   --  0.90  --  0.92  --  0.90  --  0.87  --   --   --   --   --   --  CALCIUM 8.5*   < > 7.3*  --   --   --   --   --  7.5*  --  7.5*  --  8.0*  --  8.3*  --   --   --   --   --   --   MG  --   --  1.8  --  2.8*  --  2.7*  --   --   --  2.1  --  2.1  --  2.1  --   --   --   --   --   --   PHOS 3.9  --   --   --  3.6  --  3.3  --   --   --  2.3*  --  4.4  --   --   --   --   --   --   --   --    < > = values in this interval not displayed.   GFR: Estimated Creatinine Clearance: 101 mL/min (by C-G formula based on SCr of 0.87 mg/dL). Recent Labs  Lab 10/19/23 1559 10/19/23 1639 10/21/23 9491 10/21/23 1735 10/21/23 1814 10/22/23 0410 10/22/23 0413 10/22/23 0933 10/22/23 1109 10/23/23 0358 10/23/23 0524  PROCALCITON <0.10  --   --   --   --   --   --   --   --   --   --   WBC 15.2*   < > 10.9* 9.4  --  10.3  --   --   --  17.8*  --   LATICACIDVEN  --    < > 1.2  --    < >  --  1.5 1.2 0.9  --  1.2   < > = values in this interval not displayed.    Liver Function Tests: Recent Labs  Lab 10/19/23 1559 10/20/23 1525  10/20/23 2344  AST 28  --  18  ALT 12  --  9  ALKPHOS 22*  --  14*  BILITOT 0.4  --  1.2  PROT 7.3  --  5.1*  ALBUMIN 3.3* 3.1* 2.8*   No results for input(s): LIPASE, AMYLASE in the last 168 hours. No results for input(s): AMMONIA in the last 168 hours.  ABG    Component Value Date/Time   PHART 7.467 (H) 10/23/2023 0729   PCO2ART 36.1 10/23/2023 0729   PO2ART 189 (H) 10/23/2023 0729   HCO3 26.2 10/23/2023 0729   TCO2 27 10/23/2023 0729   ACIDBASEDEF 2.0 10/21/2023 1813   O2SAT 100 10/23/2023 0729     Coagulation Profile: Recent Labs  Lab 10/19/23 1710 10/20/23 2344  INR 1.0 1.3*    Cardiac Enzymes: No results for input(s): CKTOTAL, CKMB, CKMBINDEX, TROPONINI in the last 168 hours.  HbA1C: Hgb A1c MFr Bld  Date/Time Value Ref Range Status  10/20/2023 06:43 AM 5.0 4.8 - 5.6 % Final    Comment:    (NOTE) Diagnosis of Diabetes The following HbA1c ranges recommended by the American Diabetes Association (ADA) may be used as an aid in the diagnosis of diabetes mellitus.  Hemoglobin             Suggested A1C NGSP%              Diagnosis  <5.7                   Non Diabetic  5.7-6.4  Pre-Diabetic  >6.4                   Diabetic  <7.0                   Glycemic control for                       adults with diabetes.      CBG: Recent Labs  Lab 10/22/23 1627 10/22/23 1943 10/23/23 0004 10/23/23 0403 10/23/23 0726  GLUCAP 102* 120* 161* 162* 87    The patient is critically ill due to acute bilateral submassive PE/acute respiratory failure/cardiogenic shock status post VA ECMO.  Critical care was necessary to treat or prevent imminent or life-threatening deterioration.  Critical care was time spent personally by me on the following activities: development of treatment plan with patient and/or surrogate as well as nursing, discussions with consultants, evaluation of patient's response to treatment, examination of patient,  obtaining history from patient or surrogate, ordering and performing treatments and interventions, ordering and review of laboratory studies, ordering and review of radiographic studies, pulse oximetry, re-evaluation of patient's condition and participation in multidisciplinary rounds.   During this encounter critical care time was devoted to patient care services described in this note for 38 minutes.     Valinda Novas, MD [facility] See Amion for pager If no response to pager, please call [phone] until 7pm After 7pm, Please call E-link [phone]

## 2023-10-23 NOTE — Progress Notes (Signed)
  Progress Note    10/23/2023 6:48 AM Hospital Day 3  Subjective:  intubated/sedated.  RN reports oxygen requirements increased this am  afebrile  Vitals:   10/23/23 0600 10/23/23 0630  BP:    Pulse: 83 81  Resp: 20 20  Temp: 98.1 F (36.7 C) 97.9 F (36.6 C)  SpO2: 94% 100%    Physical Exam: General:  no distress Lungs:  intubated on 100% FiO2 Extremities:  palpable right DP pulse; doppler flow left PT/pero  CBC    Component Value Date/Time   WBC 17.8 (H) 10/23/2023 0358   RBC 2.98 (L) 10/23/2023 0358   HGB 7.5 (L) 10/23/2023 0643   HGB 12.9 05/19/2012 0947   HCT 22.0 (L) 10/23/2023 0643   HCT 37.4 05/19/2012 0947   PLT 146 (L) 10/23/2023 0358   PLT 294 05/19/2012 0947   MCV 86.9 10/23/2023 0358   MCV 91 05/19/2012 0947   MCH 26.8 10/23/2023 0358   MCHC 30.9 10/23/2023 0358   RDW 19.2 (H) 10/23/2023 0358   RDW 14.7 (H) 05/19/2012 0947   LYMPHSABS 2.6 10/07/2021 1235   LYMPHSABS 2.8 05/19/2012 0947   MONOABS 0.7 10/07/2021 1235   MONOABS 0.8 05/19/2012 0947   EOSABS 0.4 10/07/2021 1235   EOSABS 0.3 05/19/2012 0947   BASOSABS 0.0 10/07/2021 1235   BASOSABS 0.1 05/19/2012 0947    BMET    Component Value Date/Time   NA 147 (H) 10/23/2023 0643   NA 143 05/19/2012 0947   K 3.9 10/23/2023 0643   K 3.8 05/19/2012 0947   CL 109 10/23/2023 0358   CL 105 05/19/2012 0947   CO2 32 10/23/2023 0358   CO2 26 05/19/2012 0947   GLUCOSE 160 (H) 10/23/2023 0358   GLUCOSE 94 05/19/2012 0947   BUN 16 10/23/2023 0358   BUN 8 05/19/2012 0947   CREATININE 0.87 10/23/2023 0358   CREATININE 0.97 05/19/2012 0947   CALCIUM 8.3 (L) 10/23/2023 0358   CALCIUM 9.3 05/19/2012 0947   GFRNONAA >60 10/23/2023 0358   GFRNONAA >60 05/19/2012 0947   GFRAA >60 08/07/2018 2007   GFRAA >60 05/19/2012 0947    INR    Component Value Date/Time   INR 1.3 (H) 10/20/2023 2344     Intake/Output Summary (Last 24 hours) at 10/23/2023 0648 Last data filed at 10/23/2023 9365 Gross per  24 hour  Intake 3666.03 ml  Output 3550 ml  Net 116.03 ml     Assessment/Plan:  47 y.o. female on ECMO for PE  Hospital Day 3  -plan for ECMO decannulation tomorrow if heart failure and CCM feel she can be decannulated.  -npo after MN   Lucie Apt, PA-C [facility] [phone] 10/23/2023 6:48 AM

## 2023-10-23 NOTE — Progress Notes (Signed)
 Patient ID: Anita Aguirre, female   DOB: October 14, 1975, 48 y.o.   MRN: [id number]     Advanced Heart Failure Rounding Note  Cardiologist: None  Chief Complaint: VA ECMO Subjective:    No pressors, stable MAP.   I/Os +597, on Lasix 2 mg/hr (decreased yesterday with chugging).   Episode overnight of high peak pressure and desaturation on vent, increased FiO2 to 1, now decreasing again down to 0.9.  Concern for aspiration with tube feeds noted around mouth. CXR with low volumes, minimal change.   Still with some vaginal oozing and clots, had 1 unit PRBCs yesterday.   Swan: PA 30/14 RA 10  VA ECMO Flow 3.3 L/min Speed 3300 rpm Pven -23 DeltaP 25 Sweep 2 ABG 7.46/36/133 Lactate 1.2 LDH 361 PTT 64 on bivalirudin goal 50-80 Co-ox 76%  Vent FiO2 0.9, Vt 490 cc  Objective:   Weight Range: 109.9 kg Body mass index is 37.95 kg/m.   Vital Signs:   Temp:  [97.9 F (36.6 C)-99 F (37.2 C)] 97.9 F (36.6 C) (07/01 0630) Pulse Rate:  [75-95] 81 (07/01 0630) Resp:  [13-23] 20 (07/01 0747) BP: (101-108)/(54-58) 108/54 (07/01 0340) SpO2:  [88 %-100 %] 100 % (07/01 0630) Arterial Line BP: (93-126)/(53-73) 104/56 (07/01 0630) FiO2 (%):  [45 %-100 %] 90 % (07/01 0747) Weight:  [109.9 kg] 109.9 kg (07/01 0600) Last BM Date : 11/18/23  Weight change: Filed Weights   10/21/23 0500 10/22/23 0500 10/23/23 0600  Weight: 111.4 kg 105.9 kg 109.9 kg    Intake/Output:   Intake/Output Summary (Last 24 hours) at 10/23/2023 0800 Last data filed at 10/23/2023 0634 Gross per 24 hour  Intake 3558.39 ml  Output 2840 ml  Net 718.39 ml      Physical Exam    General: Sedated on vent.  Neck: internal jugular lines, no thyromegaly or thyroid nodule.  Lungs: Clear to auscultation bilaterally with normal respiratory effort. CV: Nondisplaced PMI.  Heart regular S1/S2, no S3/S4, no murmur.  Trace ankle edema.   Abdomen: Soft, nontender, no hepatosplenomegaly, no distention.  Skin: Intact  without lesions or rashes.  Neurologic:Sedated on vent.  Extremities: No clubbing or cyanosis.  HEENT: Normal.   Telemetry   NSR (personally reviewed)  Labs    CBC Recent Labs    10/22/23 0410 10/22/23 0413 10/23/23 0358 10/23/23 0405 10/23/23 0643 10/23/23 0729  WBC 10.3  --  17.8*  --   --   --   HGB 8.4*   < > 8.0*   < > 7.5* 7.5*  HCT 27.5*   < > 25.9*   < > 22.0* 22.0*  MCV 84.4  --  86.9  --   --   --   PLT 178  --  146*  --   --   --    < > = values in this interval not displayed.   Basic Metabolic Panel Recent Labs    93/69/74 0410 10/22/23 0413 10/22/23 1618 10/22/23 1630 10/23/23 0358 10/23/23 0405 10/23/23 0643 10/23/23 0729  NA 147*   < > 144   < > 145   < > 147* 147*  K 3.7   < > 4.2   < > 3.7   < > 3.9 4.0  CL 112*  --  108  --  109  --   --   --   CO2 25  --  25  --  32  --   --   --   GLUCOSE  116*  --  96  --  160*  --   --   --   BUN 9  --  11  --  16  --   --   --   CREATININE 0.92  --  0.90  --  0.87  --   --   --   CALCIUM 7.5*  --  8.0*  --  8.3*  --   --   --   MG 2.1  --  2.1  --  2.1  --   --   --   PHOS 2.3*  --  4.4  --   --   --   --   --    < > = values in this interval not displayed.   Liver Function Tests Recent Labs    10/20/23 1525 10/20/23 2344  AST  --  18  ALT  --  9  ALKPHOS  --  14*  BILITOT  --  1.2  PROT  --  5.1*  ALBUMIN 3.1* 2.8*   No results for input(s): LIPASE, AMYLASE in the last 72 hours. Cardiac Enzymes No results for input(s): CKTOTAL, CKMB, CKMBINDEX, TROPONINI in the last 72 hours.  BNP: BNP (last 3 results) Recent Labs    10/20/23 0643  BNP 308.4*    ProBNP (last 3 results) No results for input(s): PROBNP in the last 8760 hours.   D-Dimer No results for input(s): DDIMER in the last 72 hours. Hemoglobin A1C No results for input(s): HGBA1C in the last 72 hours.  Fasting Lipid Panel No results for input(s): CHOL, HDL, LDLCALC, TRIG, CHOLHDL, LDLDIRECT in  the last 72 hours. Thyroid Function Tests No results for input(s): TSH, T4TOTAL, T3FREE, THYROIDAB in the last 72 hours.  Invalid input(s): FREET3  Other results:   Imaging    DG Chest 1 View Result Date: 10/23/2023 CLINICAL DATA:  ECMO patient. EXAM: CHEST  1 VIEW COMPARISON:  10/22/2023 FINDINGS: Endotracheal tube tip is 3.7 cm above the base of the carina. Right IJ central line tip overlies the right atrium. Left IJ pulmonary artery catheter tip is in the right lower lobe pulmonary artery. The cardio pericardial silhouette is enlarged. Low volume film with left base collapse/consolidation and vascular congestion. Telemetry leads overlie the chest. IMPRESSION: 1. Low volume film with left base collapse/consolidation and vascular congestion. 2. Support apparatus as above. Electronically Signed   By: Camellia Candle M.D.   On: 10/23/2023 07:51   DG CHEST PORT 1 VIEW Result Date: 10/22/2023 CLINICAL DATA:  Hypoxia. EXAM: PORTABLE CHEST 1 VIEW COMPARISON:  10/22/2023 and CT chest 10/20/2023. FINDINGS: Endotracheal tube terminates 3.2 cm above the carina. Nasogastric tube is followed into the stomach with the tip projecting beyond the inferior margin of the image. Right IJ central line tip is in the right atrium. Left IJ Swan-Ganz catheter tip is in the interlobar pulmonary artery. Deferred per later pad overlies the lower left chest and upper abdomen. Heart is enlarged. Lungs are low in volume with left basilar atelectasis. There may be a small left pleural effusion. IMPRESSION: Low lung volumes with left basilar atelectasis and probable small left pleural effusion. Electronically Signed   By: Newell Eke M.D.   On: 10/22/2023 13:03   ECHOCARDIOGRAM LIMITED Result Date: 10/22/2023    ECHOCARDIOGRAM LIMITED REPORT   [name] Date of Exam: 10/22/2023 Medical Rec #:  [id number]     Height:       67.0 in  Accession #:    [id number]    Weight:       233.5 lb Date of Birth:   19-Apr-1976     BSA:          2.160 m Patient Age:    47 years      BP:           96/63 mmHg Patient Gender: F             HR:           75 bpm. Exam Location:  Inpatient Procedure: Limited Echo, Cardiac Doppler and Color Doppler (Both Spectral and            Color Flow Doppler were utilized during procedure). Indications:    I26.02 Pulmonary embolus  History:        Patient has prior history of Echocardiogram examinations, most                 recent 10/20/2023. Risk Factors:Hypertension.  Sonographer:    Damien Senior RDCS Referring Phys: [phone] EZRA RAMAN [facility]  Sonographer Comments: Limited ECMO eval, Dr Rolan at bedside IMPRESSIONS  1. Patient on V-A ECMO via fem-fem cannulation flowing 4.3LPM during study.  2. Left ventricular ejection fraction, by estimation, is 40 to 45%. The left ventricle has mildly decreased function. The left ventricle has no regional wall motion abnormalities.  3. Right ventricular systolic function is severely reduced. The right ventricular size is mild to moderately enlarged. RVOT VTI 10.8cm. TAPSE 19.54mm. The basal RV at the level of the tricuspid valve has appropriate longitudinal motion, however, the mid to distal RV approaching the RVOT is dilated and hypokinetic consistent with acute cor pulmonale in the setting of pulmonary embolism. There is mild improvement in RV function when decreasing flows to 3LPM.  4. Tricuspid valve regurgitation is mild to moderate. FINDINGS  Left Ventricle: Left ventricular ejection fraction, by estimation, is 40 to 45%. The left ventricle has mildly decreased function. The left ventricle has no regional wall motion abnormalities. Right Ventricle: The right ventricular size is mildly enlarged. Right ventricular systolic function is severely reduced. Tricuspid Valve: The tricuspid valve is normal in structure. Tricuspid valve regurgitation is mild to moderate. Additional Comments: Spectral Doppler performed. Color Doppler performed.  RIGHT VENTRICLE RV S  prime:     16.20 cm/s TAPSE (M-mode): 2.0 cm AORTIC VALVE LVOT Vmax:   68.70 cm/s LVOT Vmean:  53.700 cm/s LVOT VTI:    0.130 m  SHUNTS Systemic VTI: 0.13 m Aditya Sabharwal Electronically signed by Ria Commander Signature Date/Time: 10/22/2023/9:10:50 AM    Final      Medications:     Scheduled Medications:  sodium chloride    Intravenous Once   Chlorhexidine Gluconate Cloth  6 each Topical Daily   famotidine  20 mg Per Tube BID   feeding supplement (PROSource TF20)  60 mL Per Tube BID   insulin aspart  0-20 Units Subcutaneous Q4H   lactulose  20 g Per Tube TID   mouth rinse  15 mL Mouth Rinse Q2H   polyethylene glycol  17 g Per Tube BID   potassium chloride   60 mEq Per Tube Once   senna-docusate  2 tablet Per Tube Daily   sodium chloride  flush  3 mL Intravenous Q12H   thiamine  100 mg Per Tube Daily    Infusions:  albumin human Stopped (10/22/23 1227)   bivalirudin (ANGIOMAX) 250 mg in sodium chloride  0.9 % 500 mL (0.5 mg/mL) infusion 0.07 mg/kg/hr (  10/23/23 0500)   feeding supplement (VITAL 1.5 CAL) Stopped (10/23/23 0430)   furosemide (LASIX) 200 mg in dextrose 5 % 100 mL (2 mg/mL) infusion 2 mg/hr (10/23/23 0500)   HYDROmorphone 5.5 mg/hr (10/23/23 0627)   meropenem (MERREM) IV 1 g (10/23/23 0553)   midazolam 10 mg/hr (10/23/23 0500)   milrinone 0.125 mcg/kg/min (10/23/23 0500)   vancomycin Stopped (10/23/23 0449)    PRN Medications: albumin human, albuterol, docusate sodium, HYDROmorphone, iohexol, midazolam, ondansetron (ZOFRAN) IV, mouth rinse, mouth rinse, polyethylene glycol, sodium chloride  flush    Assessment/Plan   1. 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 CT chest with on 10/19/23 with bilateral pulmonary emboli with severe clot burden; clot is in the segmental and distal branches, not amenable to thrombectomy.  Cannulated for V-A ECMO fem-fem on 10/20/23 for RV failure. RV on 6/28 with LV EF 65-70%, D septum, RV function moderately reduced.  Most  recent echo 6/30 with LV EF normal, RV moderately dysfunctional and mildly dilated, IVC small.  Stable on VA ECMO, no pressors. CVP 10.  Flow decreased to 3 L/min this morning.  - Will do formal flow wean this morning under echo guidance after ACT up to 250.  If he tolerates wean of flow to around 2 L/min transiently, will leave at 2.8 L/min overnight and aim for decannulation tomorrow (discussed with vascular surgery).  - Increase Lasix back to 4 mg/hr to keep I/Os negative.   2. Acute PE: With RV failure. Bilateral, received TPA to each PA by IR.  - Continue bivalirudin.  3. Uterine fibroids with bleeding: Now s/p uterine artery embolization 10/20/23. Still with oozing and clots, required 1 unit PRBCs yesterday.  - Follow CBC, repeat this afternoon => transfuse hgb < 8.   4. Acute hypoxemic respiratory failure:  Suspected aspiration overnight with high peak pressures and tube feeds around mouth.  FiO2 increased to 100%, ABG now improved and coming down on FiO2.  - Bronchoscopy this morning.  - Vent per CCM. - Empiric vancomycin/meropenem.   CRITICAL CARE Performed by: Ezra Shuck  Total critical care time: 45 minutes  Critical care time was exclusive of separately billable procedures and treating other patients.  Critical care was necessary to treat or prevent imminent or life-threatening deterioration.  Critical care was time spent personally by me on the following activities: development of treatment plan with patient and/or surrogate as well as nursing, discussions with consultants, evaluation of patient's response to treatment, examination of patient, obtaining history from patient or surrogate, ordering and performing treatments and interventions, ordering and review of laboratory studies, ordering and review of radiographic studies, pulse oximetry and re-evaluation of patient's condition.   Length of Stay: 3  Ezra Shuck, MD  10/23/2023, 8:00 AM  Advanced Heart Failure  Team Pager [phone] (M-F; 7a - 5p)  Please contact [facility] for night-coverage after hours (5p -7a ) and weekends on [email or web address]

## 2023-10-23 NOTE — Progress Notes (Signed)
 PHARMACY - ANTICOAGULATION CONSULT NOTE  Pharmacy Consult for bivalirudin Indication: pulmonary embolus/ECMO  Allergies  Allergen Reactions   Wellbutrin [Bupropion] Hives, Itching and Other (See Comments)    Skin crawling sensation    Patient Measurements: Weight: 109.9 kg (242 lb 4.6 oz)  Vital Signs: Temp: 98.4 F (36.9 C) (07/01 1900) Temp Source: Bladder (07/01 1850) Pulse Rate: 82 (07/01 1900)  Labs: Recent Labs    10/20/23 2344 10/21/23 0055 10/21/23 0508 10/21/23 0754 10/21/23 1549 10/21/23 1601 10/22/23 0410 10/22/23 0413 10/22/23 1618 10/22/23 1630 10/23/23 0358 10/23/23 0405 10/23/23 1114 10/23/23 1115 10/23/23 1452 10/23/23 1552 10/23/23 1600 10/23/23 1951 10/23/23 1952  HGB 9.0*   < > 8.9*  8.5*   < >  --    < > 8.4*   < > 7.5*   < > 8.0*   < > 8.2*   < >  --  7.9* 8.5* 8.5*  --   HCT 28.3*   < > 28.3*  25.0*   < >  --    < > 27.5*   < > 25.1*   < > 25.9*   < > 26.4*   < >  --  26.0* 25.0* 25.0*  --   PLT 249  --  233  --   --    < > 178  --   --   --  146*  --  149*  --   --   --   --   --   --   APTT  --   --   --    < >  --    < > 63*  --  69*  --  64*  --  72*  --  63*  --   --   --  62*  LABPROT 16.9*  --   --   --   --   --   --   --   --   --   --   --   --   --   --   --   --   --   --   INR 1.3*  --   --   --   --   --   --   --   --   --   --   --   --   --   --   --   --   --   --   HEPARINUNFRC 0.46  --  0.49  --  <0.10*  --   --   --   --   --   --   --   --   --   --   --   --   --   --   CREATININE 1.16*  --  1.05*  --   --    < > 0.92  --  0.90  --  0.87  --   --   --   --  0.92  --   --   --    < > = values in this interval not displayed.    Estimated Creatinine Clearance: 95.5 mL/min (by C-G formula based on SCr of 0.92 mg/dL).   Medical History: Past Medical History:  Diagnosis Date   ADD (attention deficit disorder)    Arthritis 04/24/2008   Diffuse cystic mastopathy 04/24/2010   LEFT    Hypertension    Hypothyroidism     Mastitis  Medications:  Scheduled:   sodium chloride    Intravenous Once   Chlorhexidine Gluconate Cloth  6 each Topical Daily   famotidine  20 mg Per Tube BID   feeding supplement (PROSource TF20)  60 mL Per Tube BID   insulin aspart  0-20 Units Subcutaneous Q4H   lactulose  20 g Per Tube TID   [START ON 10/24/2023] levothyroxine  88 mcg Per Tube Q0600   metoCLOPramide (REGLAN) injection  10 mg Intravenous Q6H   mouth rinse  15 mL Mouth Rinse Q2H   polyethylene glycol  17 g Per Tube BID   senna-docusate  2 tablet Per Tube Daily   sodium chloride  flush  3 mL Intravenous Q12H   thiamine  100 mg Per Tube Daily   Infusions:   albumin human Stopped (10/22/23 1227)   bivalirudin (ANGIOMAX) 250 mg in sodium chloride  0.9 % 500 mL (0.5 mg/mL) infusion 0.06 mg/kg/hr (10/23/23 1900)   feeding supplement (VITAL 1.5 CAL) Stopped (10/23/23 0430)   furosemide (LASIX) 200 mg in dextrose 5 % 100 mL (2 mg/mL) infusion 4 mg/hr (10/23/23 1900)   HYDROmorphone 5.5 mg/hr (10/23/23 1900)   meropenem (MERREM) IV Stopped (10/23/23 1334)   midazolam 10 mg/hr (10/23/23 2002)   milrinone 0.125 mcg/kg/min (10/23/23 1900)   vancomycin Stopped (10/23/23 1702)    Assessment: 47 YOF admitted with submassive PE with right heart strain. RV/LV ration 1.4. CT with bilateral PE with severe clot burden. Patient not on anticoagulation prior to hospitalization. Patient also has ongoing vaginal bleeding due to uterine fibroids, noted to be a chronic issue. Pharmacy initially consulted for heparin dosing. Patient later decompensated, started on ECMO on 6/28 PM.  -s/p catheter directed alteplase overnight 6/28-6/29, heparin was paused for ~1 hour then resumed at 0100 until 0745 on 10/21/23.  Heparin level was therapeutic at 0.49 in the morning. Transition to bivalirudin 6/29 AM.   -aPTT 64 sec on upper end of goal range on 0.07 mg/kg/h.  Hgb 8, pltc 146 trending down.  Received 1u PRBC yesterday (6/30).  LDH 361 (up),  fibrinogen 504 (up) -Plan trial ECMO wean today with tentative decannulation tomorrow.  Received 2000 units of heparin at 0900  -Patient having ongoing vaginal bleeding due to uterine fibroids > s/p embolization 6/28 but still needing 3-4 pad changes daily per RN.    2nd shift update: aPTT 63. Effects of heparin bolus from this AM likely no longer seen.   Goal of Therapy:  aPTT 50-70 seconds Monitor platelets by anticoagulation protocol: Yes   Plan:  Bivalirudin - decrease slightly to 0.060 mg/kg/hr 4h aPTT aPTT/CBC q12 5a/5p, LDH, fibrinogen daily Monitor CBC and s/sx of bleeding daily  Rankin Sams, PharmD, BCPS, BCCCP Clinical Pharmacist  Addendum: aPTT 62 on reduced dose. Reduce further to 0.055 mg/kg/hr. Recheck aPTT in 4 hours.   Rankin Sams, PharmD, BCPS, BCCCP Clinical Pharmacist

## 2023-10-23 NOTE — Procedures (Signed)
 Bronchoscopy Procedure Note  Anita Aguirre  [id number]  [birth year]  Date:10/23/23  Time:9:02 AM   Provider Performing:Denetta Fei   Procedure(s):  Flexible bronchoscopy with bronchial alveolar lavage (68375)  Indication(s) Aspiration pneumonia/acute respiratory failure with hypoxia  Consent Risks of the procedure as well as the alternatives and risks of each were explained to the patient and/or caregiver.  Consent for the procedure was obtained and is signed in the bedside chart  Anesthesia Versed, Dailudid and Rocuronium   Time Out Verified patient identification, verified procedure, site/side was marked, verified correct patient position, special equipment/implants available, medications/allergies/relevant history reviewed, required imaging and test results available.   Sterile Technique Usual hand hygiene, masks, gowns, and gloves were used   Procedure Description Bronchoscope advanced through endotracheal tube and into airway.  Airways were examined down to subsegmental level with findings noted below.   Following diagnostic evaluation, BAL(s) performed in right lower lobe with normal saline and return of yellowish fluid  Findings: Moderate amount of thin secretions noted on right side, suction and BAL performed   Complications/Tolerance None; patient tolerated the procedure well. Chest X-ray is not needed post procedure.   EBL Minimal   Specimen(s) BAL

## 2023-10-23 NOTE — Progress Notes (Signed)
 Echocardiogram 2D Echocardiogram has been performed.  Damien FALCON Olympia Adelsberger RDCS 10/23/2023, 9:59 AM

## 2023-10-23 NOTE — Progress Notes (Addendum)
 Patient ID: Anita Aguirre, female   DOB: 10-17-1975, 48 y.o.   MRN: [id number]  ECMO wean conducted under echo guidance.  ACT up to 200 prior.  We decreased flow stepwise from 3 L/min to 2.5 L/min to 2 L/min.  RV unchanged and PA pressure only slight increase (PA systolic pressure remained in 69d).  We transiently clamped off ECMO, RV function appeared improved with clamping.  PA systolic pressure remained around 36-38 mmHg.  ABG 7.45/36/172.  ECMO was unclamped and flow left at 2.8 L/min.   Will keep ECMO flow at 2.8 L/min overnight and if she remains stable, plan decannulation with vascular in OR tomorrow.  Discussed with CCM and vascular surgery.   CRITICAL CARE Performed by: Ezra Shuck  Total critical care time: 55 minutes  Critical care time was exclusive of separately billable procedures and treating other patients.  Critical care was necessary to treat or prevent imminent or life-threatening deterioration.  Critical care was time spent personally by me on the following activities: development of treatment plan with patient and/or surrogate as well as nursing, discussions with consultants, evaluation of patient's response to treatment, examination of patient, obtaining history from patient or surrogate, ordering and performing treatments and interventions, ordering and review of laboratory studies, ordering and review of radiographic studies, pulse oximetry and re-evaluation of patient's condition.  Ezra Shuck 10/23/2023 9:49 AM

## 2023-10-23 NOTE — Progress Notes (Addendum)
 Nutrition Brief Note  Patient with vomiting/aspiration event overnight and increased oxygen requirements this morning. Tube feeds being held. Had advanced to 28ml/hr with goal rate of 54ml/hr. Scheduled to be made NPO at midnight for potential ECMO decannulation tomorrow, pending stability overnight at stable flow rate. Will re-assess ability to re-start at trickle rate tomorrow. RN reports plan for patient to remain intubated. Bowel regimen adjusted, per RN. No BM since 6/27.    Estimated Nutritional Needs:  Kcal:  1700-1900 kcals Protein:  120-140g Fluid:  >1.9L/day   INTERVENTION:  Reverse Trendelenburg >10 degrees while TF infusing, HOB >30 when able to be maintained due to femoral cannulation   Hold tube feeding via OGT, and re-assess ability to start s/p decannulation. If appropriate, re-initiate: Vital 1.5 at 50 ml/h (1200 ml per day) Re-initiate at trickle rate and hold Prosource TF20 60 ml BID   Provides 1960 kcal, 121 gm protein, 917 ml free water daily    Monitor magnesium, potassium, and phosphorus BID for at least 3 days   Continue Thiamine 100 mg daily for 7 days   NUTRITION DIAGNOSIS:  Inadequate oral intake related to inability to eat as evidenced by NPO status.   GOAL:  Patient will meet greater than or equal to 90% of their needs   MONITOR:  TF tolerance, Skin, Diet advancement, Labs, I & O's  Blair Deaner MS, RD, LDN Registered Dietitian Clinical Nutrition RD Inpatient Contact Info in Amion

## 2023-10-23 NOTE — Progress Notes (Signed)
 Pt with episode of high peak pressure on the ventilator despite suctioning. Upon arrival pts peak pressures 36-39,desaturation top 85%, pt not biting on ETT and synchronous with the ventilator. Pt lavage suctioned on 100% FiO2 for a moderate amount of pale tan/white secretions with minimal improvement in peak pressures.SpO2 increased to 96%. PRN albuterol treatment given at this time for wheezing. Post neb treatment pt with improvement in peak pressures to 25-27. RT will continue to monitor and be available as needed.

## 2023-10-23 NOTE — Progress Notes (Signed)
 PHARMACY - ANTICOAGULATION CONSULT NOTE  Pharmacy Consult for bivalirudin Indication: pulmonary embolus/ECMO  Allergies  Allergen Reactions   Wellbutrin [Bupropion] Hives, Itching and Other (See Comments)    Skin crawling sensation    Patient Measurements: Weight: 109.9 kg (242 lb 4.6 oz)  Vital Signs: Temp: 98.2 F (36.8 C) (07/01 1530) Temp Source: Bladder (07/01 1200) Pulse Rate: 86 (07/01 1537)  Labs: Recent Labs    10/20/23 2344 10/21/23 0055 10/21/23 0508 10/21/23 0754 10/21/23 1549 10/21/23 1601 10/22/23 0410 10/22/23 0413 10/22/23 1618 10/22/23 1630 10/23/23 0358 10/23/23 0405 10/23/23 0948 10/23/23 1114 10/23/23 1115 10/23/23 1452  HGB 9.0*   < > 8.9*  8.5*   < >  --    < > 8.4*   < > 7.5*   < > 8.0*   < > 7.8* 8.2* 8.8*  --   HCT 28.3*   < > 28.3*  25.0*   < >  --    < > 27.5*   < > 25.1*   < > 25.9*   < > 23.0* 26.4* 26.0*  --   PLT 249  --  233  --   --    < > 178  --   --   --  146*  --   --  149*  --   --   APTT  --   --   --    < >  --    < > 63*  --  69*  --  64*  --   --  72*  --  63*  LABPROT 16.9*  --   --   --   --   --   --   --   --   --   --   --   --   --   --   --   INR 1.3*  --   --   --   --   --   --   --   --   --   --   --   --   --   --   --   HEPARINUNFRC 0.46  --  0.49  --  <0.10*  --   --   --   --   --   --   --   --   --   --   --   CREATININE 1.16*  --  1.05*  --   --    < > 0.92  --  0.90  --  0.87  --   --   --   --   --    < > = values in this interval not displayed.    Estimated Creatinine Clearance: 101 mL/min (by C-G formula based on SCr of 0.87 mg/dL).   Medical History: Past Medical History:  Diagnosis Date   ADD (attention deficit disorder)    Arthritis 04/24/2008   Diffuse cystic mastopathy 04/24/2010   LEFT    Hypertension    Hypothyroidism    Mastitis     Medications:  Scheduled:   Chlorhexidine Gluconate Cloth  6 each Topical Daily   famotidine  20 mg Per Tube BID   feeding supplement (PROSource  TF20)  60 mL Per Tube BID   insulin aspart  0-20 Units Subcutaneous Q4H   lactulose  20 g Per Tube TID   [START ON 10/24/2023] levothyroxine  88 mcg Per Tube Q0600   metoCLOPramide (REGLAN) injection  10 mg  Intravenous Q6H   mouth rinse  15 mL Mouth Rinse Q2H   polyethylene glycol  17 g Per Tube BID   senna-docusate  2 tablet Per Tube Daily   sodium chloride  flush  3 mL Intravenous Q12H   thiamine  100 mg Per Tube Daily   Infusions:   albumin human Stopped (10/22/23 1227)   bivalirudin (ANGIOMAX) 250 mg in sodium chloride  0.9 % 500 mL (0.5 mg/mL) infusion 0.065 mg/kg/hr (10/23/23 1500)   feeding supplement (VITAL 1.5 CAL) Stopped (10/23/23 0430)   furosemide (LASIX) 200 mg in dextrose 5 % 100 mL (2 mg/mL) infusion 4 mg/hr (10/23/23 1500)   HYDROmorphone 5.5 mg/hr (10/23/23 1500)   meropenem (MERREM) IV Stopped (10/23/23 1334)   midazolam 10 mg/hr (10/23/23 1500)   milrinone 0.125 mcg/kg/min (10/23/23 1500)   vancomycin 1,250 mg (10/23/23 1532)    Assessment: 47 YOF admitted with submassive PE with right heart strain. RV/LV ration 1.4. CT with bilateral PE with severe clot burden. Patient not on anticoagulation prior to hospitalization. Patient also has ongoing vaginal bleeding due to uterine fibroids, noted to be a chronic issue. Pharmacy initially consulted for heparin dosing. Patient later decompensated, started on ECMO on 6/28 PM.  -s/p catheter directed alteplase overnight 6/28-6/29, heparin was paused for ~1 hour then resumed at 0100 until 0745 on 10/21/23.  Heparin level was therapeutic at 0.49 in the morning. Transition to bivalirudin 6/29 AM.   -aPTT 64 sec on upper end of goal range on 0.07 mg/kg/h.  Hgb 8, pltc 146 trending down.  Received 1u PRBC yesterday (6/30).  LDH 361 (up), fibrinogen 504 (up) -Plan trial ECMO wean today with tentative decannulation tomorrow.  Received 2000 units of heparin at 0900  -Patient having ongoing vaginal bleeding due to uterine fibroids > s/p  embolization 6/28 but still needing 3-4 pad changes daily per RN.    2nd shift update: aPTT 63. Effects of heparin bolus from this AM likely no longer seen.   Goal of Therapy:  aPTT 50-70 seconds Monitor platelets by anticoagulation protocol: Yes   Plan:  Bivalirudin - decrease slightly to 0.060 mg/kg/hr 4h aPTT aPTT/CBC q12 5a/5p, LDH, fibrinogen daily Monitor CBC and s/sx of bleeding daily  Rankin Sams, PharmD, BCPS, BCCCP Clinical Pharmacist

## 2023-10-23 NOTE — Progress Notes (Signed)
 ECMO Wean Trial  A wean trial was started at 0938 per Dr. Rolan.  Vent settings: FiO2: 100   PEEP:  10  ECMO Settings: Flows: 3   RPMs: 3000  Per Dr.McLean instructed to decrease flows to 2.5 at 0938, to 2.0 at 0939, clamped and off ECMO at 0943 for a total of 1 minute 30 seconds, back on ECMO at 0945 with flows set to 2.8.  Dr. Harold and Dr. Rolan at bedside through the trial.     ECMO wean trial passed,

## 2023-10-23 NOTE — Progress Notes (Signed)
 OBSTETRICS AND GYNECOLOGY ATTENDING CONSULT NOTE  History of Present Illness: Anita Aguirre is an 48 y.o. [id number] female who was admitted for pulmonary embolism, on ECMO in ICU  Patient is currently intubated, on ECMO,  and unable to provide history. No family at bedside. Dr. Harold and RN Sarah at bedside who help supply the history.  Patient reported to hospital 3 days ago with shortness of breath, diagnosed with PE. She has had chronic abnormal uterine bleeding that has been ongoing since about 2013 due to fibroids. Reportedly not on any hormone therapy. She has had a history of multiple miscarriages (negative APLA workup this admission).   Per her RN, she has been bleeding through approximately 3-4 medium pads per shift. Also passing some clots. She had a uterine artery embolization on 6/28. Noted to be a successful bilateral embolization with gelfoam to near-stasis.  On chart review, pelvic ultrasound from 2023 noted uterus of volume 694 ml, Globular heterogeneous uterus. Subcentimeter cystic changes of the myometrium. Couple of intramural uterine masses measuring 6.8 x 5.5 x 3.7 cm and 5.8 x 4.4 x 4.9 cm. Findings suggestive of adenomyosis and intramural uterine fibroids.  She reportedly has been transfused a few units however in the setting of ECMO this is unreliable for assessing blood loss.  Pertinent OB/GYN History: No LMP recorded. (Menstrual status: IUD). OB History  Gravida Para Term Preterm AB Living  3 0 0 0 3 0  SAB IAB Ectopic Multiple Live Births  3 0 0 0 0    # Outcome Date GA Lbr Len/2nd Weight Sex Type Anes PTL Lv  3 SAB           2 SAB           1 SAB             Obstetric Comments  FIRST MENSTRUAL 11    Patient Active Problem List   Diagnosis Date Noted   Acute respiratory failure with hypoxia (HCC) 10/23/2023   Abnormal uterine bleeding 10/23/2023   Acute pulmonary embolism with acute cor pulmonale, unspecified pulmonary embolism type (HCC)  10/20/2023   Cardiogenic shock (HCC) 10/20/2023   Bilateral pulmonary embolism (HCC) 10/19/2023   Myocardial injury 10/19/2023   Hypokalemia 10/19/2023   Elevated lactic acid level 10/19/2023   Leukocytosis 10/19/2023   Obesity (BMI 30-39.9) 10/19/2023   Pulmonary embolism and infarction (HCC) 10/19/2023   Hypertension    ADD (attention deficit disorder)    Hypothyroidism    Fibroid uterus 08/28/2018   Migraines 08/09/2018   GERD (gastroesophageal reflux disease) 08/09/2018   Menorrhagia with regular cycle 08/09/2018   PTSD (post-traumatic stress disorder) 01/16/2012    Past Medical History:  Diagnosis Date   ADD (attention deficit disorder)    Arthritis 04/24/2008   Diffuse cystic mastopathy 04/24/2010   LEFT    Hypertension    Hypothyroidism    Mastitis     Past Surgical History:  Procedure Laterality Date   ARTERIAL LINE INSERTION N/A 10/20/2023   Procedure: ARTERIAL LINE INSERTION;  Surgeon: Gardenia Led, DO;  Location: [facility] INVASIVE CV LAB;  Service: Cardiovascular;  Laterality: N/A;   ARTERIAL LINE INSERTION N/A 10/20/2023   Procedure: ARTERIAL LINE INSERTION;  Surgeon: Wonda Sharper, MD;  Location: [facility] INVASIVE CV LAB;  Service: Cardiovascular;  Laterality: N/A;   BREAST MASS EXCISION Left 2012   COLONOSCOPY  2005   [place] ? MD    ECMO CANNULATION N/A 10/20/2023   Procedure: ECMO CANNULATION;  Surgeon: Gardenia,  Aditya, DO;  Location: [facility] INVASIVE CV LAB;  Service: Cardiovascular;  Laterality: N/A;   INCISE AND DRAIN ABCESS  2010   IR ANGIOGRAM PELVIS SELECTIVE OR SUPRASELECTIVE  10/20/2023   IR ANGIOGRAM PULMONARY BILATERAL SELECTIVE  10/20/2023   IR ANGIOGRAM SELECTIVE EACH ADDITIONAL VESSEL  10/20/2023   IR ANGIOGRAM SELECTIVE EACH ADDITIONAL VESSEL  10/20/2023   IR ANGIOGRAM SELECTIVE EACH ADDITIONAL VESSEL  10/20/2023   IR ANGIOGRAM SELECTIVE EACH ADDITIONAL VESSEL  10/20/2023   IR EMBO TUMOR ORGAN ISCHEMIA INFARCT INC GUIDE ROADMAPPING  10/20/2023   IR  INFUSION THROMBOL ARTERIAL INITIAL (MS)  10/20/2023   IR INFUSION THROMBOL ARTERIAL INITIAL (MS)  10/20/2023   IR THROMB F/U EVAL ART/VEN FINAL DAY (MS)  10/21/2023   IR US  GUIDE VASC ACCESS RIGHT  10/20/2023   IR US  GUIDE VASC ACCESS RIGHT  10/20/2023   MASTECTOMY, PARTIAL  2013   RIGHT HEART CATH N/A 10/20/2023   Procedure: RIGHT HEART CATH;  Surgeon: Gardenia Led, DO;  Location: [facility] INVAS[facility] CV LAB;  Service: Cardiovascular;  Laterality: N/A;    Family History  Problem Relation Age of Onset   Hypertension Mother    Throat cancer Paternal Aunt 6   Diabetes Paternal Grandmother     Social History:  reports that she has quit smoking. Her smoking use included cigarettes. She has a 10 pack-year smoking history. She has never used smokeless tobacco. She reports that she does not drink alcohol and does not use drugs.  Allergies:  Allergies  Allergen Reactions   Wellbutrin [Bupropion] Hives, Itching and Other (See Comments)    Skin crawling sensation    Medications: I have reviewed the patient's current medications.  Review of Systems: Pertinent items are noted in HPI.  Focused Physical Examination: BP (!) 108/54   Pulse 84   Temp 98.2 F (36.8 C)   Resp 20   Wt 109.9 kg   SpO2 100%   BMI 37.95 kg/m  CONSTITUTIONAL: Well-developed, well-nourished female, intubated PSYCHIATRIC: sedated and intubated ABDOMEN: Soft, obese, no masses noted   PELVIC: internal exam deferred, pad evaluated and is approximately 30% saturated, no active bleeding noted, no clots noted    Labs and Imaging: Results for orders placed or performed during the hospital encounter of 10/20/23 (from the past 72 hours)  Glucose, capillary     Status: Abnormal   Collection Time: 10/20/23 11:32 AM  Result Value Ref Range   Glucose-Capillary 147 (H) 70 - 99 mg/dL    Comment: Glucose reference range applies only to samples taken after fasting for at least 8 hours.  Cooxemetry Panel (carboxy, met, total hgb,  O2 sat)     Status: Abnormal   Collection Time: 10/20/23 11:33 AM  Result Value Ref Range   Total hemoglobin 8.8 (L) 12.0 - 16.0 g/dL   O2 Saturation 52.0 %   Carboxyhemoglobin 1.7 (H) 0.5 - 1.5 %   Methemoglobin <0.7 0.0 - 1.5 %    Comment: Performed at [facility], [address], [place], [place] [address]  MRSA Next Gen by PCR, Nasal     Status: None   Collection Time: 10/20/23  3:25 PM   Specimen: Nasal Mucosa; Nasal Swab  Result Value Ref Range   MRSA by PCR Next Gen NOT DETECTED NOT DETECTED    Comment: (NOTE) The GeneXpert MRSA Assay (FDA approved for NASAL specimens only), is one component of a comprehensive MRSA colonization surveillance program. It is not intended to diagnose MRSA infection nor to  guide or monitor treatment for MRSA infections. Test performance is not FDA approved in patients less than 35 years old. Performed at [facility], [address], [place], [place] [address]   Hemoglobin and hematocrit, blood     Status: Abnormal   Collection Time: 10/20/23  3:25 PM  Result Value Ref Range   Hemoglobin 8.4 (L) 12.0 - 15.0 g/dL   HCT 72.7 (L) 63.9 - 53.9 %    Comment: Performed at [facility], [address] [place], [place] [address]  Heparin level (unfractionated)     Status: Abnormal   Collection Time: 10/20/23  3:25 PM  Result Value Ref Range   Heparin Unfractionated 0.29 (L) 0.30 - 0.70 IU/mL    Comment: (NOTE) The clinical reportable range upper limit is being lowered to >1.10 to align with the FDA approved guidance for the current laboratory assay.  If heparin results are below expected values, and patient dosage has  been confirmed, suggest follow up testing of antithrombin III levels. Performed at [facility], [address] [place], [place] [address]   Renal function panel     Status: Abnormal   Collection Time: 10/20/23  3:25 PM  Result Value Ref Range   Sodium 135 135 - 145 mmol/L   Potassium 3.4 (L) 3.5 - 5.1  mmol/L   Chloride 101 98 - 111 mmol/L   CO2 18 (L) 22 - 32 mmol/L   Glucose, Bld 136 (H) 70 - 99 mg/dL    Comment: Glucose reference range applies only to samples taken after fasting for at least 8 hours.   BUN 14 6 - 20 mg/dL   Creatinine, Ser 8.77 (H) 0.44 - 1.00 mg/dL   Calcium 8.5 (L) 8.9 - 10.3 mg/dL   Phosphorus 3.9 2.5 - 4.6 mg/dL   Albumin 3.1 (L) 3.5 - 5.0 g/dL   GFR, Estimated 55 (L) >60 mL/min    Comment: (NOTE) Calculated using the CKD-EPI Creatinine Equation (2021)    Anion gap 16 (H) 5 - 15    Comment: Performed at [facility] Lab, [address], [place], [place] [address]  I-STAT 7, (LYTES, BLD GAS, ICA, H+H)     Status: Abnormal   Collection Time: 10/20/23  3:34 PM  Result Value Ref Range   pH, Arterial 7.456 (H) 7.35 - 7.45   pCO2 arterial 23.9 (L) 32 - 48 mmHg   pO2, Arterial 74 (L) 83 - 108 mmHg   Bicarbonate 16.7 (L) 20.0 - 28.0 mmol/L   TCO2 17 (L) 22 - 32 mmol/L   O2 Saturation 95 %   Acid-base deficit 6.0 (H) 0.0 - 2.0 mmol/L   Sodium 136 135 - 145 mmol/L   Potassium 3.4 (L) 3.5 - 5.1 mmol/L   Calcium, Ion 1.12 (L) 1.15 - 1.40 mmol/L   HCT 26.0 (L) 36.0 - 46.0 %   Hemoglobin 8.8 (L) 12.0 - 15.0 g/dL   Patient temperature 61.9 C    Sample type ARTERIAL   CG4 I-STAT (Lactic acid)     Status: Abnormal   Collection Time: 10/20/23  3:39 PM  Result Value Ref Range   Lactic Acid, Venous 2.2 (HH) 0.5 - 1.9 mmol/L   Comment NOTIFIED PHYSICIAN   Glucose, capillary     Status: Abnormal   Collection Time: 10/20/23  3:40 PM  Result Value Ref Range   Glucose-Capillary 125 (H) 70 - 99 mg/dL    Comment: Glucose reference range applies only to samples  taken after fasting for at least 8 hours.  Type and screen [facility]     Status: None   Collection Time: 10/20/23  4:40 PM  Result Value Ref Range   ABO/RH(D) O POS    Antibody Screen NEG    Sample Expiration 10/19/2023,2359   Type and screen [facility]     Status: None  (Preliminary result)   Collection Time: 10/20/23  5:22 PM  Result Value Ref Range   ABO/RH(D) O POS    Antibody Screen NEG    Sample Expiration 10/23/2023,2359    Unit Number [id number]    Blood Component Type RED CELLS,LR    Unit division 00    Status of Unit ISSUED,FINAL    Unit tag comment EMERGENCY RELEASE    Transfusion Status OK TO TRANSFUSE    Crossmatch Result COMPATIBLE    Unit Number [id number]    Blood Component Type RED CELLS,LR    Unit division 00    Status of Unit REL FROM [facility]    Unit tag comment EMERGENCY RELEASE    Transfusion Status OK TO TRANSFUSE    Crossmatch Result COMPATIBLE    Unit Number [id number]    Blood Component Type RED CELLS,LR    Unit division 00    Status of Unit ISSUED,FINAL    Unit tag comment EMERGENCY RELEASE    Transfusion Status OK TO TRANSFUSE    Crossmatch Result COMPATIBLE    Unit Number [id number]    Blood Component Type RED CELLS,LR    Unit division 00    Status of Unit ISSUED,FINAL    Unit tag comment EMERGENCY RELEASE    Transfusion Status OK TO TRANSFUSE    Crossmatch Result COMPATIBLE    Unit Number [id number]    Blood Component Type RED CELLS,LR    Unit division 00    Status of Unit ISSUED,FINAL    Transfusion Status OK TO TRANSFUSE    Crossmatch Result      Compatible Performed at [facility], [address] [place], [place] [address]    Unit Number [id number]    Blood Component Type RED CELLS,LR    Unit division 00    Status of Unit ALLOCATED    Transfusion Status OK TO TRANSFUSE    Crossmatch Result Compatible    Unit Number [id number]    Blood Component Type RED CELLS,LR    Unit division 00    Status of Unit ALLOCATED    Transfusion Status OK TO TRANSFUSE    Crossmatch Result Compatible    Unit Number [id number]    Blood Component Type RED CELLS,LR    Unit division 00    Status of Unit ALLOCATED    Transfusion Status OK TO TRANSFUSE    Crossmatch Result Compatible     Unit Number [id number]    Blood Component Type RED CELLS,LR    Unit division 00    Status of Unit REL FROM [facility]    Transfusion Status OK TO TRANSFUSE    Crossmatch Result Compatible    Unit Number [id number]    Blood Component Type RED CELLS,LR    Unit division 00    Status of Unit REL FROM [facility]    Transfusion Status OK TO TRANSFUSE    Crossmatch Result Compatible    Unit Number [id number]    Blood Component Type RED CELLS,LR    Unit division 00    Status of Unit REL FROM [facility]    Transfusion  Status OK TO TRANSFUSE    Crossmatch Result Compatible    Unit Number [id number]    Blood Component Type RED CELLS,LR    Unit division 00    Status of Unit REL FROM [facility]    Transfusion Status OK TO TRANSFUSE    Crossmatch Result Compatible    Unit Number [id number]    Blood Component Type RED CELLS,LR    Unit division 00    Status of Unit ALLOCATED    Transfusion Status OK TO TRANSFUSE    Crossmatch Result Compatible   Prepare RBC     Status: None   Collection Time: 10/20/23  5:22 PM  Result Value Ref Range   Order Confirmation      ORDER PROCESSED BY BLOOD BANK Performed at [facility], [address] [place], [place] [address]   POCT Activated clotting time     Status: None   Collection Time: 10/20/23  5:55 PM  Result Value Ref Range   Activated Clotting Time 129 seconds    Comment: Reference range 74-137 seconds for patients not on anticoagulant therapy.  I-STAT 7, (LYTES, BLD GAS, ICA, H+H)     Status: Abnormal   Collection Time: 10/20/23  6:45 PM  Result Value Ref Range   pH, Arterial 7.335 (L) 7.35 - 7.45   pCO2 arterial 35.5 32 - 48 mmHg   pO2, Arterial 105 83 - 108 mmHg   Bicarbonate 19.0 (L) 20.0 - 28.0 mmol/L   TCO2 20 (L) 22 - 32 mmol/L   O2 Saturation 98 %   Acid-base deficit 6.0 (H) 0.0 - 2.0 mmol/L   Sodium 136 135 - 145 mmol/L   Potassium 3.6 3.5 - 5.1 mmol/L   Calcium, Ion 1.04 (L) 1.15 - 1.40 mmol/L   HCT 21.0 (L) 36.0 - 46.0 %    Hemoglobin 7.1 (L) 12.0 - 15.0 g/dL   Patient temperature 63.4 C    Sample type ARTERIAL   POCT Activated clotting time     Status: None   Collection Time: 10/20/23  6:51 PM  Result Value Ref Range   Activated Clotting Time 170 seconds    Comment: Reference range 74-137 seconds for patients not on anticoagulant therapy.  CG4 I-STAT (Lactic acid)     Status: None   Collection Time: 10/20/23  7:28 PM  Result Value Ref Range   Lactic Acid, Venous 1.9 0.5 - 1.9 mmol/L  I-STAT 7, (LYTES, BLD GAS, ICA, H+H)     Status: Abnormal   Collection Time: 10/20/23  7:28 PM  Result Value Ref Range   pH, Arterial 7.365 7.35 - 7.45   pCO2 arterial 40.2 32 - 48 mmHg   pO2, Arterial 122 (H) 83 - 108 mmHg   Bicarbonate 22.9 20.0 - 28.0 mmol/L   TCO2 24 22 - 32 mmol/L   O2 Saturation 99 %   Acid-base deficit 2.0 0.0 - 2.0 mmol/L   Sodium 139 135 - 145 mmol/L   Potassium 3.7 3.5 - 5.1 mmol/L   Calcium, Ion 0.91 (L) 1.15 - 1.40 mmol/L   HCT 23.0 (L) 36.0 - 46.0 %   Hemoglobin 7.8 (L) 12.0 - 15.0 g/dL   Collection site art line    Drawn by Nurse    Sample type ARTERIAL   I-STAT, chem 8     Status: Abnormal   Collection Time: 10/20/23  7:28 PM  Result Value Ref Range   Sodium 139 135 - 145 mmol/L   Potassium 3.7 3.5 -  5.1 mmol/L   Chloride 102 98 - 111 mmol/L   BUN 13 6 - 20 mg/dL   Creatinine, Ser 8.79 (H) 0.44 - 1.00 mg/dL   Glucose, Bld 830 (H) 70 - 99 mg/dL    Comment: Glucose reference range applies only to samples taken after fasting for at least 8 hours.   Calcium, Ion 0.92 (L) 1.15 - 1.40 mmol/L   TCO2 22 22 - 32 mmol/L   Hemoglobin 8.2 (L) 12.0 - 15.0 g/dL   HCT 75.9 (L) 63.9 - 53.9 %  Glucose, capillary     Status: Abnormal   Collection Time: 10/20/23 10:55 PM  Result Value Ref Range   Glucose-Capillary 130 (H) 70 - 99 mg/dL    Comment: Glucose reference range applies only to samples taken after fasting for at least 8 hours.  I-STAT 7, (LYTES, BLD GAS, ICA, H+H)     Status: Abnormal    Collection Time: 10/20/23 11:25 PM  Result Value Ref Range   pH, Arterial 7.383 7.35 - 7.45   pCO2 arterial 30.3 (L) 32 - 48 mmHg   pO2, Arterial 157 (H) 83 - 108 mmHg   Bicarbonate 18.2 (L) 20.0 - 28.0 mmol/L   TCO2 19 (L) 22 - 32 mmol/L   O2 Saturation 99 %   Acid-base deficit 6.0 (H) 0.0 - 2.0 mmol/L   Sodium 140 135 - 145 mmol/L   Potassium 3.3 (L) 3.5 - 5.1 mmol/L   Calcium, Ion 1.10 (L) 1.15 - 1.40 mmol/L   HCT 25.0 (L) 36.0 - 46.0 %   Hemoglobin 8.5 (L) 12.0 - 15.0 g/dL   Patient temperature 63.9 C    Sample type ARTERIAL   Heparin level (unfractionated) every 6 hours x 4 post-procedure     Status: None   Collection Time: 10/20/23 11:44 PM  Result Value Ref Range   Heparin Unfractionated 0.46 0.30 - 0.70 IU/mL    Comment: (NOTE) The clinical reportable range upper limit is being lowered to >1.10 to align with the FDA approved guidance for the current laboratory assay.  If heparin results are below expected values, and patient dosage has  been confirmed, suggest follow up testing of antithrombin III levels. Performed at [facility], [address], [place], [place] [address]   Comprehensive metabolic panel     Status: Abnormal   Collection Time: 10/20/23 11:44 PM  Result Value Ref Range   Sodium 138 135 - 145 mmol/L   Potassium 3.2 (L) 3.5 - 5.1 mmol/L   Chloride 110 98 - 111 mmol/L   CO2 19 (L) 22 - 32 mmol/L   Glucose, Bld 131 (H) 70 - 99 mg/dL    Comment: Glucose reference range applies only to samples taken after fasting for at least 8 hours.   BUN 12 6 - 20 mg/dL   Creatinine, Ser 8.83 (H) 0.44 - 1.00 mg/dL   Calcium 7.6 (L) 8.9 - 10.3 mg/dL   Total Protein 5.1 (L) 6.5 - 8.1 g/dL   Albumin 2.8 (L) 3.5 - 5.0 g/dL   AST 18 15 - 41 U/L   ALT 9 0 - 44 U/L   Alkaline Phosphatase 14 (L) 38 - 126 U/L   Total Bilirubin 1.2 0.0 - 1.2 mg/dL   GFR, Estimated 58 (L) >60 mL/min    Comment: (NOTE) Calculated using the CKD-EPI Creatinine Equation (2021)     Anion gap 9 5 - 15    Comment: Performed at [facility], [address], [place], [place] [address]  CBC     Status: Abnormal   Collection Time: 10/20/23 11:44 PM  Result Value Ref Range   WBC 12.4 (H) 4.0 - 10.5 K/uL   RBC 3.44 (L) 3.87 - 5.11 MIL/uL   Hemoglobin 9.0 (L) 12.0 - 15.0 g/dL   HCT 71.6 (L) 63.9 - 53.9 %   MCV 82.3 80.0 - 100.0 fL   MCH 26.2 26.0 - 34.0 pg   MCHC 31.8 30.0 - 36.0 g/dL   RDW 80.3 (H) 88.4 - 84.4 %   Platelets 249 150 - 400 K/uL   nRBC 0.0 0.0 - 0.2 %    Comment: Performed at [facility], [address], [place], [place] [address]  Protime-INR     Status: Abnormal   Collection Time: 10/20/23 11:44 PM  Result Value Ref Range   Prothrombin Time 16.9 (H) 11.4 - 15.2 seconds   INR 1.3 (H) 0.8 - 1.2    Comment: (NOTE) INR goal varies based on device and disease states. Performed at [facility], [address], [place], [place] [address]   Fibrinogen     Status: None   Collection Time: 10/20/23 11:44 PM  Result Value Ref Range   Fibrinogen 311 210 - 475 mg/dL    Comment: (NOTE) Fibrinogen results may be underestimated in patients receiving thrombolytic therapy. Performed at [facility], [address], [place], [place] [address]   Lactic acid, plasma     Status: Abnormal   Collection Time: 10/20/23 11:44 PM  Result Value Ref Range   Lactic Acid, Venous 2.8 (HH) 0.5 - 1.9 mmol/L    Comment: CRITICAL RESULT CALLED TO, READ BACK BY AND VERIFIED WITH EMERSON FINDER RN [phone] [facility] Performed at [facility], [address], [place], [place] [address]   I-STAT 7, (LYTES, BLD GAS, ICA, H+H)     Status: Abnormal   Collection Time: 10/21/23 12:55 AM  Result Value Ref Range   pH, Arterial 7.336 (L) 7.35 - 7.45   pCO2 arterial 33.3 32 - 48 mmHg   pO2, Arterial 192 (H) 83 - 108 mmHg   Bicarbonate 18.0 (L) 20.0 - 28.0 mmol/L   TCO2 19 (L) 22 - 32 mmol/L   O2 Saturation 100 %   Acid-base deficit 7.0 (H) 0.0 - 2.0  mmol/L   Sodium 141 135 - 145 mmol/L   Potassium 3.5 3.5 - 5.1 mmol/L   Calcium, Ion 1.10 (L) 1.15 - 1.40 mmol/L   HCT 25.0 (L) 36.0 - 46.0 %   Hemoglobin 8.5 (L) 12.0 - 15.0 g/dL   Patient temperature 63.6 C    Sample type ARTERIAL   Glucose, capillary     Status: Abnormal   Collection Time: 10/21/23 12:58 AM  Result Value Ref Range   Glucose-Capillary 114 (H) 70 - 99 mg/dL    Comment: Glucose reference range applies only to samples taken after fasting for at least 8 hours.  Glucose, capillary     Status: Abnormal   Collection Time: 10/21/23  5:06 AM  Result Value Ref Range   Glucose-Capillary 120 (H) 70 - 99 mg/dL    Comment: Glucose reference range applies only to samples taken after fasting for at least 8 hours.  CBC     Status: Abnormal   Collection Time: 10/21/23  5:08 AM  Result Value Ref Range   WBC 10.9 (H) 4.0 - 10.5 K/uL   RBC 3.47 (L) 3.87 - 5.11  MIL/uL   Hemoglobin 8.9 (L) 12.0 - 15.0 g/dL   HCT 71.6 (L) 63.9 - 53.9 %   MCV 81.6 80.0 - 100.0 fL   MCH 25.6 (L) 26.0 - 34.0 pg   MCHC 31.4 30.0 - 36.0 g/dL   RDW 80.0 (H) 88.4 - 84.4 %   Platelets 233 150 - 400 K/uL   nRBC 0.0 0.0 - 0.2 %    Comment: Performed at [facility], [address] [place], [place] [address]  Basic metabolic panel     Status: Abnormal   Collection Time: 10/21/23  5:08 AM  Result Value Ref Range   Sodium 139 135 - 145 mmol/L   Potassium 4.5 3.5 - 5.1 mmol/L   Chloride 113 (H) 98 - 111 mmol/L   CO2 16 (L) 22 - 32 mmol/L   Glucose, Bld 119 (H) 70 - 99 mg/dL    Comment: Glucose reference range applies only to samples taken after fasting for at least 8 hours.   BUN 11 6 - 20 mg/dL   Creatinine, Ser 8.94 (H) 0.44 - 1.00 mg/dL   Calcium 7.3 (L) 8.9 - 10.3 mg/dL   GFR, Estimated >39 >39 mL/min    Comment: (NOTE) Calculated using the CKD-EPI Creatinine Equation (2021)    Anion gap 10 5 - 15    Comment: Performed at [facility], [address] [place], [place] [address]   Magnesium     Status: None   Collection Time: 10/21/23  5:08 AM  Result Value Ref Range   Magnesium 1.8 1.7 - 2.4 mg/dL    Comment: Performed at [facility], [address], [place], [place] [address]  Cooxemetry Panel (carboxy, met, total hgb, O2 sat)     Status: Abnormal   Collection Time: 10/21/23  5:08 AM  Result Value Ref Range   Total hemoglobin 9.3 (L) 12.0 - 16.0 g/dL   O2 Saturation 22.7 %   Carboxyhemoglobin 1.6 (H) 0.5 - 1.5 %   Methemoglobin 1.7 (H) 0.0 - 1.5 %    Comment: Performed at [facility], [address], [place], [place] [address]  Heparin level (unfractionated) every 6 hours x 4 post-procedure     Status: None   Collection Time: 10/21/23  5:08 AM  Result Value Ref Range   Heparin Unfractionated 0.49 0.30 - 0.70 IU/mL    Comment: (NOTE) The clinical reportable range upper limit is being lowered to >1.10 to align with the FDA approved guidance for the current laboratory assay.  If heparin results are below expected values, and patient dosage has  been confirmed, suggest follow up testing of antithrombin III levels. Performed at [facility], [address], [place], [place] [address]   Fibrinogen every 6 hours x 4 post-procedure     Status: None   Collection Time: 10/21/23  5:08 AM  Result Value Ref Range   Fibrinogen 324 210 - 475 mg/dL    Comment: (NOTE) Fibrinogen results may be underestimated in patients receiving thrombolytic therapy. Performed at [facility], [address] [place], [place] [address]   I-STAT 7, (LYTES, BLD GAS, ICA, H+H)     Status: Abnormal   Collection Time: 10/21/23  5:08 AM  Result Value Ref Range   pH, Arterial 7.395 7.35 - 7.45   pCO2 arterial 29.8 (L) 32 - 48 mmHg   pO2, Arterial 151 (H) 83 - 108 mmHg   Bicarbonate 18.3 (L) 20.0 - 28.0  mmol/L   TCO2 19 (L) 22 - 32 mmol/L   O2 Saturation 99 %   Acid-base deficit 6.0 (H) 0.0 - 2.0 mmol/L   Sodium 141 135 - 145 mmol/L   Potassium 4.6 3.5 -  5.1 mmol/L   Calcium, Ion 1.13 (L) 1.15 - 1.40 mmol/L   HCT 25.0 (L) 36.0 - 46.0 %   Hemoglobin 8.5 (L) 12.0 - 15.0 g/dL   Patient temperature 63.4 C    Sample type ARTERIAL   CG4 I-STAT (Lactic acid)     Status: None   Collection Time: 10/21/23  5:08 AM  Result Value Ref Range   Lactic Acid, Venous 1.2 0.5 - 1.9 mmol/L  Glucose, capillary     Status: Abnormal   Collection Time: 10/21/23  7:51 AM  Result Value Ref Range   Glucose-Capillary 117 (H) 70 - 99 mg/dL    Comment: Glucose reference range applies only to samples taken after fasting for at least 8 hours.  I-STAT 7, (LYTES, BLD GAS, ICA, H+H)     Status: Abnormal   Collection Time: 10/21/23  7:54 AM  Result Value Ref Range   pH, Arterial 7.363 7.35 - 7.45   pCO2 arterial 31.6 (L) 32 - 48 mmHg   pO2, Arterial 95 83 - 108 mmHg   Bicarbonate 18.0 (L) 20.0 - 28.0 mmol/L   TCO2 19 (L) 22 - 32 mmol/L   O2 Saturation 97 %   Acid-base deficit 7.0 (H) 0.0 - 2.0 mmol/L   Sodium 141 135 - 145 mmol/L   Potassium 4.4 3.5 - 5.1 mmol/L   Calcium, Ion 1.16 1.15 - 1.40 mmol/L   HCT 26.0 (L) 36.0 - 46.0 %   Hemoglobin 8.8 (L) 12.0 - 15.0 g/dL   Patient temperature 63.2 C    Sample type ARTERIAL   Glucose, capillary     Status: None   Collection Time: 10/21/23 12:03 PM  Result Value Ref Range   Glucose-Capillary 87 70 - 99 mg/dL    Comment: Glucose reference range applies only to samples taken after fasting for at least 8 hours.   Comment 1 Notify RN    Comment 2 Document in Chart   Vancomycin, random     Status: None   Collection Time: 10/21/23 12:15 PM  Result Value Ref Range   Vancomycin Rm 11 ug/mL    Comment:        Random Vancomycin therapeutic range is dependent on dosage and time of specimen collection. A peak range is 20-40 ug/mL A trough range is 5-15 ug/mL        Performed at [facility], [address], [place], [place] [address]   Magnesium     Status: Abnormal   Collection Time: 10/21/23 12:15 PM  Result  Value Ref Range   Magnesium 2.8 (H) 1.7 - 2.4 mg/dL    Comment: Performed at [facility], [address], [place], [place] [address]  Phosphorus     Status: None   Collection Time: 10/21/23 12:15 PM  Result Value Ref Range   Phosphorus 3.6 2.5 - 4.6 mg/dL    Comment: Performed at [facility], [address], [place], [place] [address]  I-STAT 7, (LYTES, BLD GAS, ICA, H+H)     Status: Abnormal   Collection Time: 10/21/23 12:16 PM  Result Value Ref Range   pH, Arterial 7.419 7.35 - 7.45   pCO2 arterial 35.4 32 - 48 mmHg   pO2, Arterial 69 (L)  83 - 108 mmHg   Bicarbonate 22.9 20.0 - 28.0 mmol/L   TCO2 24 22 - 32 mmol/L   O2 Saturation 94 %   Acid-base deficit 1.0 0.0 - 2.0 mmol/L   Sodium 144 135 - 145 mmol/L   Potassium 4.1 3.5 - 5.1 mmol/L   Calcium, Ion 1.12 (L) 1.15 - 1.40 mmol/L   HCT 23.0 (L) 36.0 - 46.0 %   Hemoglobin 7.8 (L) 12.0 - 15.0 g/dL   Patient temperature 63.1 C    Sample type ARTERIAL   APTT     Status: Abnormal   Collection Time: 10/21/23  3:00 PM  Result Value Ref Range   aPTT 72 (H) 24 - 36 seconds    Comment:        IF BASELINE aPTT IS ELEVATED, SUGGEST PATIENT RISK ASSESSMENT BE USED TO DETERMINE APPROPRIATE ANTICOAGULANT THERAPY. Performed at [facility], [address] [place], [place] [address]   Hemoglobin and hematocrit, blood     Status: Abnormal   Collection Time: 10/21/23  3:48 PM  Result Value Ref Range   Hemoglobin 8.4 (L) 12.0 - 15.0 g/dL   HCT 72.9 (L) 63.9 - 53.9 %    Comment: Performed at [facility], [address], [place], [place] [address]  Magnesium     Status: Abnormal   Collection Time: 10/21/23  3:48 PM  Result Value Ref Range   Magnesium 2.7 (H) 1.7 - 2.4 mg/dL    Comment: Performed at [facility], [address], [place], [place] [address]  Phosphorus     Status: None   Collection Time: 10/21/23  3:48 PM  Result Value Ref Range   Phosphorus 3.3 2.5 - 4.6 mg/dL    Comment: Performed at  [facility], [address], [place], [place] [address]  Heparin level (unfractionated) every 6 hours x 4 post-procedure     Status: Abnormal   Collection Time: 10/21/23  3:49 PM  Result Value Ref Range   Heparin Unfractionated <0.10 (L) 0.30 - 0.70 IU/mL    Comment: (NOTE) The clinical reportable range upper limit is being lowered to >1.10 to align with the FDA approved guidance for the current laboratory assay.  If heparin results are below expected values, and patient dosage has  been confirmed, suggest follow up testing of antithrombin III levels. Performed at [facility], [address] [place], [place] [address]   Glucose, capillary     Status: None   Collection Time: 10/21/23  3:59 PM  Result Value Ref Range   Glucose-Capillary 91 70 - 99 mg/dL    Comment: Glucose reference range applies only to samples taken after fasting for at least 8 hours.  I-STAT 7, (LYTES, BLD GAS, ICA, H+H)     Status: Abnormal   Collection Time: 10/21/23  4:01 PM  Result Value Ref Range   pH, Arterial 7.421 7.35 - 7.45   pCO2 arterial 34.0 32 - 48 mmHg   pO2, Arterial 76 (L) 83 - 108 mmHg   Bicarbonate 22.2 20.0 - 28.0 mmol/L   TCO2 23 22 - 32 mmol/L   O2 Saturation 96 %   Acid-base deficit 2.0 0.0 - 2.0 mmol/L   Sodium 143 135 - 145 mmol/L   Potassium 3.6 3.5 - 5.1 mmol/L   Calcium, Ion 1.10 (L) 1.15 - 1.40 mmol/L   HCT 27.0 (L) 36.0 - 46.0 %   Hemoglobin 9.2 (L) 12.0 - 15.0 g/dL  Patient temperature 36.7 C    Sample type ARTERIAL   CBC every 6 hours x 4 post-procedure     Status: Abnormal   Collection Time: 10/21/23  5:35 PM  Result Value Ref Range   WBC 9.4 4.0 - 10.5 K/uL   RBC 3.20 (L) 3.87 - 5.11 MIL/uL   Hemoglobin 8.4 (L) 12.0 - 15.0 g/dL   HCT 73.3 (L) 63.9 - 53.9 %   MCV 83.1 80.0 - 100.0 fL   MCH 26.3 26.0 - 34.0 pg   MCHC 31.6 30.0 - 36.0 g/dL   RDW 79.7 (H) 88.4 - 84.4 %   Platelets 192 150 - 400 K/uL   nRBC 0.0 0.0 - 0.2 %    Comment: Performed at [facility], [address] [place], [place] [address]  Basic metabolic panel     Status: Abnormal   Collection Time: 10/21/23  5:35 PM  Result Value Ref Range   Sodium 143 135 - 145 mmol/L   Potassium 3.5 3.5 - 5.1 mmol/L   Chloride 110 98 - 111 mmol/L   CO2 22 22 - 32 mmol/L   Glucose, Bld 103 (H) 70 - 99 mg/dL    Comment: Glucose reference range applies only to samples taken after fasting for at least 8 hours.   BUN 9 6 - 20 mg/dL   Creatinine, Ser 9.09 0.44 - 1.00 mg/dL   Calcium 7.5 (L) 8.9 - 10.3 mg/dL   GFR, Estimated >39 >39 mL/min    Comment: (NOTE) Calculated using the CKD-EPI Creatinine Equation (2021)    Anion gap 11 5 - 15    Comment: Performed at [facility], [address], [place], [place] [address]  I-STAT 7, (LYTES, BLD GAS, ICA, H+H)     Status: Abnormal   Collection Time: 10/21/23  6:13 PM  Result Value Ref Range   pH, Arterial 7.417 7.35 - 7.45   pCO2 arterial 35.1 32 - 48 mmHg   pO2, Arterial 70 (L) 83 - 108 mmHg   Bicarbonate 22.7 20.0 - 28.0 mmol/L   TCO2 24 22 - 32 mmol/L   O2 Saturation 95 %   Acid-base deficit 2.0 0.0 - 2.0 mmol/L   Sodium 145 135 - 145 mmol/L   Potassium 3.6 3.5 - 5.1 mmol/L   Calcium, Ion 1.11 (L) 1.15 - 1.40 mmol/L   HCT 23.0 (L) 36.0 - 46.0 %   Hemoglobin 7.8 (L) 12.0 - 15.0 g/dL   Patient temperature 63.3 C    Sample type ARTERIAL   Lactic acid, plasma     Status: Abnormal   Collection Time: 10/21/23  6:14 PM  Result Value Ref Range   Lactic Acid, Venous 2.3 (HH) 0.5 - 1.9 mmol/L    Comment: CRITICAL VALUE NOTED. VALUE IS CONSISTENT WITH PREVIOUSLY REPORTED/CALLED VALUE Performed at [facility], [address], [place], [place] [address]   Glucose, capillary     Status: None   Collection Time: 10/21/23  7:29 PM  Result Value Ref Range   Glucose-Capillary 84 70 - 99 mg/dL    Comment: Glucose reference range applies only to samples taken after fasting for at least 8 hours.  APTT     Status: Abnormal    Collection Time: 10/21/23  9:20 PM  Result Value Ref Range   aPTT 63 (H) 24 - 36 seconds    Comment:        IF BASELINE aPTT IS ELEVATED, SUGGEST PATIENT RISK ASSESSMENT BE  USED TO DETERMINE APPROPRIATE ANTICOAGULANT THERAPY. Performed at [facility], [address] [place], [place] [address]   Fibrinogen     Status: None   Collection Time: 10/21/23  9:20 PM  Result Value Ref Range   Fibrinogen 281 210 - 475 mg/dL    Comment: (NOTE) Fibrinogen results may be underestimated in patients receiving thrombolytic therapy. Performed at [facility], [address], [place], [place] [address]   I-STAT 7, (LYTES, BLD GAS, ICA, H+H)     Status: Abnormal   Collection Time: 10/21/23  9:21 PM  Result Value Ref Range   pH, Arterial 7.428 7.35 - 7.45   pCO2 arterial 36.8 32 - 48 mmHg   pO2, Arterial 76 (L) 83 - 108 mmHg   Bicarbonate 24.4 20.0 - 28.0 mmol/L   TCO2 25 22 - 32 mmol/L   O2 Saturation 96 %   Acid-Base Excess 0.0 0.0 - 2.0 mmol/L   Sodium 146 (H) 135 - 145 mmol/L   Potassium 3.5 3.5 - 5.1 mmol/L   Calcium, Ion 1.10 (L) 1.15 - 1.40 mmol/L   HCT 24.0 (L) 36.0 - 46.0 %   Hemoglobin 8.2 (L) 12.0 - 15.0 g/dL   Patient temperature 63.1 C    Sample type ARTERIAL   CG4 I-STAT (Lactic acid)     Status: None   Collection Time: 10/21/23  9:32 PM  Result Value Ref Range   Lactic Acid, Venous 1.4 0.5 - 1.9 mmol/L  Hemoglobin and hematocrit, blood     Status: Abnormal   Collection Time: 10/21/23 10:46 PM  Result Value Ref Range   Hemoglobin 8.4 (L) 12.0 - 15.0 g/dL   HCT 72.8 (L) 63.9 - 53.9 %    Comment: Performed at [facility], [address], [place], [place] [address]  Glucose, capillary     Status: None   Collection Time: 10/21/23 11:16 PM  Result Value Ref Range   Glucose-Capillary 84 70 - 99 mg/dL    Comment: Glucose reference range applies only to samples taken after fasting for at least 8 hours.  Glucose, capillary     Status: Abnormal   Collection  Time: 10/22/23  3:24 AM  Result Value Ref Range   Glucose-Capillary 116 (H) 70 - 99 mg/dL    Comment: Glucose reference range applies only to samples taken after fasting for at least 8 hours.  Vancomycin, trough     Status: Abnormal   Collection Time: 10/22/23  3:25 AM  Result Value Ref Range   Vancomycin Tr 12 (L) 15 - 20 ug/mL    Comment: Performed at [facility], [address] [place], [place] [address]  CBC     Status: Abnormal   Collection Time: 10/22/23  4:10 AM  Result Value Ref Range   WBC 10.3 4.0 - 10.5 K/uL   RBC 3.26 (L) 3.87 - 5.11 MIL/uL   Hemoglobin 8.4 (L) 12.0 - 15.0 g/dL   HCT 72.4 (L) 63.9 - 53.9 %   MCV 84.4 80.0 - 100.0 fL   MCH 25.8 (L) 26.0 - 34.0 pg   MCHC 30.5 30.0 - 36.0 g/dL   RDW 79.6 (H) 88.4 - 84.4 %   Platelets 178 150 - 400 K/uL   nRBC 0.0 0.0 - 0.2 %    Comment: Performed at [facility], [address] [place], [place] [address]  Basic metabolic panel     Status: Abnormal   Collection Time: 10/22/23  4:10 AM  Result Value Ref Range   Sodium 147 (H) 135 - 145 mmol/L   Potassium 3.7 3.5 - 5.1 mmol/L   Chloride 112 (H) 98 - 111 mmol/L   CO2 25 22 - 32 mmol/L   Glucose, Bld 116 (H) 70 - 99 mg/dL    Comment: Glucose reference range applies only to samples taken after fasting for at least 8 hours.   BUN 9 6 - 20 mg/dL   Creatinine, Ser 9.07 0.44 - 1.00 mg/dL   Calcium 7.5 (L) 8.9 - 10.3 mg/dL   GFR, Estimated >39 >39 mL/min    Comment: (NOTE) Calculated using the CKD-EPI Creatinine Equation (2021)    Anion gap 10 5 - 15    Comment: Performed at [facility], [address] [place], [place] [address]  Magnesium     Status: None   Collection Time: 10/22/23  4:10 AM  Result Value Ref Range   Magnesium 2.1 1.7 - 2.4 mg/dL    Comment: Performed at [facility], [address], [place], [place] [address]  Phosphorus     Status: Abnormal   Collection Time: 10/22/23  4:10 AM  Result Value Ref Range   Phosphorus 2.3 (L)  2.5 - 4.6 mg/dL    Comment: Performed at [facility], [address] [place], [place] [address]  Lactate dehydrogenase     Status: Abnormal   Collection Time: 10/22/23  4:10 AM  Result Value Ref Range   LDH 286 (H) 98 - 192 U/L    Comment: Performed at [facility], [address], [place], [place] [address]  APTT     Status: Abnormal   Collection Time: 10/22/23  4:10 AM  Result Value Ref Range   aPTT 63 (H) 24 - 36 seconds    Comment:        IF BASELINE aPTT IS ELEVATED, SUGGEST PATIENT RISK ASSESSMENT BE USED TO DETERMINE APPROPRIATE ANTICOAGULANT THERAPY. Performed at [facility], [address], [place], [place] [address]   Cooxemetry Panel (carboxy, met, total hgb, O2 sat)     Status: Abnormal   Collection Time: 10/22/23  4:12 AM  Result Value Ref Range   Total hemoglobin 7.7 (L) 12.0 - 16.0 g/dL   O2 Saturation 23.8 %   Carboxyhemoglobin 2.1 (H) 0.5 - 1.5 %   Methemoglobin <0.7 0.0 - 1.5 %    Comment: Performed at [facility], [address], [place], [place] [address]  CG4 I-STAT (Lactic acid)     Status: None   Collection Time: 10/22/23  4:13 AM  Result Value Ref Range   Lactic Acid, Venous 1.5 0.5 - 1.9 mmol/L  I-STAT 7, (LYTES, BLD GAS, ICA, H+H)     Status: Abnormal   Collection Time: 10/22/23  4:13 AM  Result Value Ref Range   pH, Arterial 7.407 7.35 - 7.45   pCO2 arterial 41.3 32 - 48 mmHg   pO2, Arterial 71 (L) 83 - 108 mmHg   Bicarbonate 26.0 20.0 - 28.0 mmol/L   TCO2 27 22 - 32 mmol/L   O2 Saturation 94 %   Acid-Base Excess 1.0 0.0 - 2.0 mmol/L   Sodium 146 (H) 135 - 145 mmol/L   Potassium 3.7 3.5 - 5.1 mmol/L   Calcium, Ion 1.09 (L) 1.15 - 1.40 mmol/L   HCT 24.0 (L) 36.0 - 46.0 %   Hemoglobin 8.2 (L) 12.0 - 15.0 g/dL   Patient temperature  36.9 C    Sample type ARTERIAL   Glucose, capillary     Status: Abnormal   Collection Time: 10/22/23  7:59 AM  Result Value Ref Range   Glucose-Capillary 123 (H) 70 - 99 mg/dL     Comment: Glucose reference range applies only to samples taken after fasting for at least 8 hours.  I-STAT 7, (LYTES, BLD GAS, ICA, H+H)     Status: Abnormal   Collection Time: 10/22/23  8:29 AM  Result Value Ref Range   pH, Arterial 7.424 7.35 - 7.45   pCO2 arterial 40.5 32 - 48 mmHg   pO2, Arterial 77 (L) 83 - 108 mmHg   Bicarbonate 26.5 20.0 - 28.0 mmol/L   TCO2 28 22 - 32 mmol/L   O2 Saturation 96 %   Acid-Base Excess 2.0 0.0 - 2.0 mmol/L   Sodium 146 (H) 135 - 145 mmol/L   Potassium 4.0 3.5 - 5.1 mmol/L   Calcium, Ion 1.08 (L) 1.15 - 1.40 mmol/L   HCT 23.0 (L) 36.0 - 46.0 %   Hemoglobin 7.8 (L) 12.0 - 15.0 g/dL   Patient temperature 62.9 C    Sample type ARTERIAL   Hemoglobin and hematocrit, blood     Status: Abnormal   Collection Time: 10/22/23  9:27 AM  Result Value Ref Range   Hemoglobin 7.9 (L) 12.0 - 15.0 g/dL   HCT 73.5 (L) 63.9 - 53.9 %    Comment: Performed at [facility], [address] [place], [place] [address]  CG4 I-STAT (Lactic acid)     Status: None   Collection Time: 10/22/23  9:33 AM  Result Value Ref Range   Lactic Acid, Venous 1.2 0.5 - 1.9 mmol/L  I-STAT 7, (LYTES, BLD GAS, ICA, H+H)     Status: Abnormal   Collection Time: 10/22/23  9:37 AM  Result Value Ref Range   pH, Arterial 7.401 7.35 - 7.45   pCO2 arterial 45.9 32 - 48 mmHg   pO2, Arterial 72 (L) 83 - 108 mmHg   Bicarbonate 28.6 (H) 20.0 - 28.0 mmol/L   TCO2 30 22 - 32 mmol/L   O2 Saturation 94 %   Acid-Base Excess 3.0 (H) 0.0 - 2.0 mmol/L   Sodium 145 135 - 145 mmol/L   Potassium 4.2 3.5 - 5.1 mmol/L   Calcium, Ion 1.23 1.15 - 1.40 mmol/L   HCT 23.0 (L) 36.0 - 46.0 %   Hemoglobin 7.8 (L) 12.0 - 15.0 g/dL   Patient temperature 63.0 C    Sample type ARTERIAL   CG4 I-STAT (Lactic acid)     Status: None   Collection Time: 10/22/23 11:09 AM  Result Value Ref Range   Lactic Acid, Venous 0.9 0.5 - 1.9 mmol/L  Cooxemetry Panel (carboxy, met, total hgb, O2 sat)     Status: Abnormal    Collection Time: 10/22/23 11:16 AM  Result Value Ref Range   Total hemoglobin 9.0 (L) 12.0 - 16.0 g/dL   O2 Saturation 22.6 %   Carboxyhemoglobin 2.0 (H) 0.5 - 1.5 %   Methemoglobin <0.7 0.0 - 1.5 %    Comment: Performed at [facility], [address] [place], [place] [address]  Glucose, capillary     Status: Abnormal   Collection Time: 10/22/23 11:38 AM  Result Value Ref Range   Glucose-Capillary 151 (H) 70 - 99 mg/dL    Comment: Glucose reference range applies only to samples taken after fasting for at least 8 hours.  I-STAT 7, (LYTES, BLD GAS, ICA, H+H)     Status: Abnormal   Collection Time: 10/22/23 11:40 AM  Result Value Ref Range   pH, Arterial 7.315 (L) 7.35 - 7.45   pCO2 arterial 53.7 (H) 32 - 48 mmHg   pO2, Arterial 79 (L) 83 - 108 mmHg   Bicarbonate 27.3 20.0 - 28.0 mmol/L   TCO2 29 22 - 32 mmol/L   O2 Saturation 94 %   Acid-Base Excess 1.0 0.0 - 2.0 mmol/L   Sodium 144 135 - 145 mmol/L   Potassium 4.5 3.5 - 5.1 mmol/L   Calcium, Ion 1.18 1.15 - 1.40 mmol/L   HCT 28.0 (L) 36.0 - 46.0 %   Hemoglobin 9.5 (L) 12.0 - 15.0 g/dL   Patient temperature 62.7 C    Sample type ARTERIAL   Lactate dehydrogenase     Status: Abnormal   Collection Time: 10/22/23 11:59 AM  Result Value Ref Range   LDH 295 (H) 98 - 192 U/L    Comment: Performed at [facility], [address], [place], [place] [address]  Cooxemetry Panel (carboxy, met, total hgb, O2 sat)     Status: Abnormal   Collection Time: 10/22/23  2:28 PM  Result Value Ref Range   Total hemoglobin 7.7 (L) 12.0 - 16.0 g/dL   O2 Saturation 27.6 %   Carboxyhemoglobin 2.5 (H) 0.5 - 1.5 %   Methemoglobin <0.7 0.0 - 1.5 %    Comment: Performed at [facility], [address] [place], [place] [address]  Hemoglobin and hematocrit, blood     Status: Abnormal   Collection Time: 10/22/23  4:18 PM  Result Value Ref Range   Hemoglobin 7.5 (L) 12.0 - 15.0 g/dL   HCT 74.8 (L) 63.9 - 53.9 %    Comment: Performed at  [facility], [address], [place], [place] [address]  Magnesium     Status: None   Collection Time: 10/22/23  4:18 PM  Result Value Ref Range   Magnesium 2.1 1.7 - 2.4 mg/dL    Comment: Performed at [facility], [address] [place], [place] [address]  Phosphorus     Status: None   Collection Time: 10/22/23  4:18 PM  Result Value Ref Range   Phosphorus 4.4 2.5 - 4.6 mg/dL    Comment: Performed at [facility], [address], [place], [place] [address]  APTT     Status: Abnormal   Collection Time: 10/22/23  4:18 PM  Result Value Ref Range   aPTT 69 (H) 24 - 36 seconds    Comment:        IF BASELINE aPTT IS ELEVATED, SUGGEST PATIENT RISK ASSESSMENT BE USED TO DETERMINE APPROPRIATE ANTICOAGULANT THERAPY. Performed at [facility], [address], [place], [place] [address]   Basic metabolic panel     Status: Abnormal   Collection Time: 10/22/23  4:18 PM  Result Value Ref Range   Sodium 144 135 - 145 mmol/L   Potassium 4.2 3.5 - 5.1 mmol/L   Chloride 108 98 - 111 mmol/L   CO2 25 22 - 32 mmol/L   Glucose, Bld 96 70 - 99 mg/dL    Comment: Glucose reference range applies only to samples taken after fasting for at least 8 hours.   BUN 11 6 - 20 mg/dL   Creatinine, Ser 9.09 0.44 - 1.00 mg/dL   Calcium 8.0 (L) 8.9 - 10.3 mg/dL  GFR, Estimated >60 >60 mL/min    Comment: (NOTE) Calculated using the CKD-EPI Creatinine Equation (2021)    Anion gap 11 5 - 15    Comment: Performed at [facility], [address], [place], [place] [address]  Glucose, capillary     Status: Abnormal   Collection Time: 10/22/23  4:27 PM  Result Value Ref Range   Glucose-Capillary 102 (H) 70 - 99 mg/dL    Comment: Glucose reference range applies only to samples taken after fasting for at least 8 hours.  I-STAT 7, (LYTES, BLD GAS, ICA, H+H)     Status: Abnormal   Collection Time: 10/22/23  4:30 PM  Result Value Ref Range   pH, Arterial 7.431 7.35 - 7.45   pCO2  arterial 40.9 32 - 48 mmHg   pO2, Arterial 72 (L) 83 - 108 mmHg   Bicarbonate 27.3 20.0 - 28.0 mmol/L   TCO2 28 22 - 32 mmol/L   O2 Saturation 95 %   Acid-Base Excess 3.0 (H) 0.0 - 2.0 mmol/L   Sodium 145 135 - 145 mmol/L   Potassium 4.2 3.5 - 5.1 mmol/L   Calcium, Ion 1.16 1.15 - 1.40 mmol/L   HCT 22.0 (L) 36.0 - 46.0 %   Hemoglobin 7.5 (L) 12.0 - 15.0 g/dL   Patient temperature 63.0 C    Sample type ARTERIAL   Prepare RBC (crossmatch)     Status: None   Collection Time: 10/22/23  5:39 PM  Result Value Ref Range   Order Confirmation      ORDER PROCESSED BY BLOOD BANK Performed at [facility], [address], [place], [place] [address]   Glucose, capillary     Status: Abnormal   Collection Time: 10/22/23  7:43 PM  Result Value Ref Range   Glucose-Capillary 120 (H) 70 - 99 mg/dL    Comment: Glucose reference range applies only to samples taken after fasting for at least 8 hours.  I-STAT 7, (LYTES, BLD GAS, ICA, H+H)     Status: Abnormal   Collection Time: 10/22/23  7:45 PM  Result Value Ref Range   pH, Arterial 7.442 7.35 - 7.45   pCO2 arterial 36.7 32 - 48 mmHg   pO2, Arterial 71 (L) 83 - 108 mmHg   Bicarbonate 25.1 20.0 - 28.0 mmol/L   TCO2 26 22 - 32 mmol/L   O2 Saturation 95 %   Acid-Base Excess 1.0 0.0 - 2.0 mmol/L   Sodium 147 (H) 135 - 145 mmol/L   Potassium 4.1 3.5 - 5.1 mmol/L   Calcium, Ion 1.14 (L) 1.15 - 1.40 mmol/L   HCT 24.0 (L) 36.0 - 46.0 %   Hemoglobin 8.2 (L) 12.0 - 15.0 g/dL   Patient temperature 63.3 C    Sample type ARTERIAL   Hemoglobin and hematocrit, blood     Status: Abnormal   Collection Time: 10/22/23 10:51 PM  Result Value Ref Range   Hemoglobin 8.2 (L) 12.0 - 15.0 g/dL   HCT 73.4 (L) 63.9 - 53.9 %    Comment: Performed at [facility], [address], [place], [place] [address]  Glucose, capillary     Status: Abnormal   Collection Time: 10/23/23 12:04 AM  Result Value Ref Range   Glucose-Capillary 161 (H) 70 - 99 mg/dL     Comment: Glucose reference range applies only to samples taken after fasting for at least 8 hours.  I-STAT 7, (LYTES, BLD GAS, ICA, H+H)  Status: Abnormal   Collection Time: 10/23/23 12:06 AM  Result Value Ref Range   pH, Arterial 7.410 7.35 - 7.45   pCO2 arterial 42.0 32 - 48 mmHg   pO2, Arterial 75 (L) 83 - 108 mmHg   Bicarbonate 26.7 20.0 - 28.0 mmol/L   TCO2 28 22 - 32 mmol/L   O2 Saturation 95 %   Acid-Base Excess 2.0 0.0 - 2.0 mmol/L   Sodium 147 (H) 135 - 145 mmol/L   Potassium 3.8 3.5 - 5.1 mmol/L   Calcium, Ion 1.19 1.15 - 1.40 mmol/L   HCT 23.0 (L) 36.0 - 46.0 %   Hemoglobin 7.8 (L) 12.0 - 15.0 g/dL   Patient temperature 63.3 C    Sample type ARTERIAL   CBC     Status: Abnormal   Collection Time: 10/23/23  3:58 AM  Result Value Ref Range   WBC 17.8 (H) 4.0 - 10.5 K/uL   RBC 2.98 (L) 3.87 - 5.11 MIL/uL   Hemoglobin 8.0 (L) 12.0 - 15.0 g/dL   HCT 74.0 (L) 63.9 - 53.9 %   MCV 86.9 80.0 - 100.0 fL   MCH 26.8 26.0 - 34.0 pg   MCHC 30.9 30.0 - 36.0 g/dL   RDW 80.7 (H) 88.4 - 84.4 %   Platelets 146 (L) 150 - 400 K/uL   nRBC 0.1 0.0 - 0.2 %    Comment: Performed at [facility], [address], [place], [place] [address]  Basic metabolic panel     Status: Abnormal   Collection Time: 10/23/23  3:58 AM  Result Value Ref Range   Sodium 145 135 - 145 mmol/L   Potassium 3.7 3.5 - 5.1 mmol/L   Chloride 109 98 - 111 mmol/L   CO2 32 22 - 32 mmol/L   Glucose, Bld 160 (H) 70 - 99 mg/dL    Comment: Glucose reference range applies only to samples taken after fasting for at least 8 hours.   BUN 16 6 - 20 mg/dL   Creatinine, Ser 9.12 0.44 - 1.00 mg/dL   Calcium 8.3 (L) 8.9 - 10.3 mg/dL   GFR, Estimated >39 >39 mL/min    Comment: (NOTE) Calculated using the CKD-EPI Creatinine Equation (2021)    Anion gap 4 (L) 5 - 15    Comment: Performed at [facility], [address], [place], [place] [address]  Magnesium     Status: None   Collection Time: 10/23/23  3:58 AM   Result Value Ref Range   Magnesium 2.1 1.7 - 2.4 mg/dL    Comment: Performed at [facility], [address], [place], [place] [address]  Lactate dehydrogenase     Status: Abnormal   Collection Time: 10/23/23  3:58 AM  Result Value Ref Range   LDH 361 (H) 98 - 192 U/L    Comment: Performed at [facility], [address], [place], [place] [address]  APTT     Status: Abnormal   Collection Time: 10/23/23  3:58 AM  Result Value Ref Range   aPTT 64 (H) 24 - 36 seconds    Comment:        IF BASELINE aPTT IS ELEVATED, SUGGEST PATIENT RISK ASSESSMENT BE USED TO DETERMINE APPROPRIATE ANTICOAGULANT THERAPY. Performed at [facility], [address], [place], [place] [address]   Fibrinogen     Status: Abnormal   Collection Time: 10/23/23  3:58 AM  Result Value Ref Range   Fibrinogen 504 (  H) 210 - 475 mg/dL    Comment: (NOTE) Fibrinogen results may be underestimated in patients receiving thrombolytic therapy. Performed at [facility], [address] [place], [place] [address]   Glucose, capillary     Status: Abnormal   Collection Time: 10/23/23  4:03 AM  Result Value Ref Range   Glucose-Capillary 162 (H) 70 - 99 mg/dL    Comment: Glucose reference range applies only to samples taken after fasting for at least 8 hours.  I-STAT 7, (LYTES, BLD GAS, ICA, H+H)     Status: Abnormal   Collection Time: 10/23/23  4:05 AM  Result Value Ref Range   pH, Arterial 7.423 7.35 - 7.45   pCO2 arterial 42.2 32 - 48 mmHg   pO2, Arterial 67 (L) 83 - 108 mmHg   Bicarbonate 27.7 20.0 - 28.0 mmol/L   TCO2 29 22 - 32 mmol/L   O2 Saturation 94 %   Acid-Base Excess 3.0 (H) 0.0 - 2.0 mmol/L   Sodium 147 (H) 135 - 145 mmol/L   Potassium 3.7 3.5 - 5.1 mmol/L   Calcium, Ion 1.18 1.15 - 1.40 mmol/L   HCT 22.0 (L) 36.0 - 46.0 %   Hemoglobin 7.5 (L) 12.0 - 15.0 g/dL   Patient temperature 63.3 C    Sample type ARTERIAL   I-STAT 7, (LYTES, BLD GAS, ICA, H+H)     Status: Abnormal    Collection Time: 10/23/23  5:18 AM  Result Value Ref Range   pH, Arterial 7.438 7.35 - 7.45   pCO2 arterial 38.9 32 - 48 mmHg   pO2, Arterial 65 (L) 83 - 108 mmHg   Bicarbonate 26.4 20.0 - 28.0 mmol/L   TCO2 28 22 - 32 mmol/L   O2 Saturation 94 %   Acid-Base Excess 2.0 0.0 - 2.0 mmol/L   Sodium 146 (H) 135 - 145 mmol/L   Potassium 3.8 3.5 - 5.1 mmol/L   Calcium, Ion 1.18 1.15 - 1.40 mmol/L   HCT 23.0 (L) 36.0 - 46.0 %   Hemoglobin 7.8 (L) 12.0 - 15.0 g/dL   Patient temperature 63.3 C    Sample type ARTERIAL   Cooxemetry Panel (carboxy, met, total hgb, O2 sat)     Status: Abnormal   Collection Time: 10/23/23  5:22 AM  Result Value Ref Range   Total hemoglobin 8.0 (L) 12.0 - 16.0 g/dL   O2 Saturation 19.2 %   Carboxyhemoglobin 2.6 (H) 0.5 - 1.5 %   Methemoglobin 0.8 0.0 - 1.5 %    Comment: Performed at [facility], [address], [place], [place] [address]  CG4 I-STAT (Lactic acid)     Status: None   Collection Time: 10/23/23  5:24 AM  Result Value Ref Range   Lactic Acid, Venous 1.2 0.5 - 1.9 mmol/L  I-STAT 7, (LYTES, BLD GAS, ICA, H+H)     Status: Abnormal   Collection Time: 10/23/23  5:45 AM  Result Value Ref Range   pH, Arterial 7.457 (H) 7.35 - 7.45   pCO2 arterial 38.3 32 - 48 mmHg   pO2, Arterial 61 (L) 83 - 108 mmHg   Bicarbonate 27.1 20.0 - 28.0 mmol/L   TCO2 28 22 - 32 mmol/L   O2 Saturation 93 %   Acid-Base Excess 3.0 (H) 0.0 - 2.0 mmol/L   Sodium 146 (H) 135 - 145 mmol/L   Potassium 3.9 3.5 - 5.1 mmol/L   Calcium, Ion 1.17 1.15 - 1.40 mmol/L   HCT 23.0 (L)  36.0 - 46.0 %   Hemoglobin 7.8 (L) 12.0 - 15.0 g/dL   Patient temperature 63.3 C    Sample type ARTERIAL   I-STAT 7, (LYTES, BLD GAS, ICA, H+H)     Status: Abnormal   Collection Time: 10/23/23  6:09 AM  Result Value Ref Range   pH, Arterial 7.454 (H) 7.35 - 7.45   pCO2 arterial 35.6 32 - 48 mmHg   pO2, Arterial 63 (L) 83 - 108 mmHg   Bicarbonate 25.0 20.0 - 28.0 mmol/L   TCO2 26 22 - 32 mmol/L    O2 Saturation 93 %   Acid-Base Excess 1.0 0.0 - 2.0 mmol/L   Sodium 147 (H) 135 - 145 mmol/L   Potassium 3.9 3.5 - 5.1 mmol/L   Calcium, Ion 1.17 1.15 - 1.40 mmol/L   HCT 22.0 (L) 36.0 - 46.0 %   Hemoglobin 7.5 (L) 12.0 - 15.0 g/dL   Patient temperature 63.2 C    Sample type ARTERIAL   I-STAT 7, (LYTES, BLD GAS, ICA, H+H)     Status: Abnormal   Collection Time: 10/23/23  6:43 AM  Result Value Ref Range   pH, Arterial 7.459 (H) 7.35 - 7.45   pCO2 arterial 36.0 32 - 48 mmHg   pO2, Arterial 133 (H) 83 - 108 mmHg   Bicarbonate 25.6 20.0 - 28.0 mmol/L   TCO2 27 22 - 32 mmol/L   O2 Saturation 99 %   Acid-Base Excess 2.0 0.0 - 2.0 mmol/L   Sodium 147 (H) 135 - 145 mmol/L   Potassium 3.9 3.5 - 5.1 mmol/L   Calcium, Ion 1.19 1.15 - 1.40 mmol/L   HCT 22.0 (L) 36.0 - 46.0 %   Hemoglobin 7.5 (L) 12.0 - 15.0 g/dL   Patient temperature 63.3 C    Sample type ARTERIAL   Glucose, capillary     Status: None   Collection Time: 10/23/23  7:26 AM  Result Value Ref Range   Glucose-Capillary 87 70 - 99 mg/dL    Comment: Glucose reference range applies only to samples taken after fasting for at least 8 hours.  I-STAT 7, (LYTES, BLD GAS, ICA, H+H)     Status: Abnormal   Collection Time: 10/23/23  7:29 AM  Result Value Ref Range   pH, Arterial 7.467 (H) 7.35 - 7.45   pCO2 arterial 36.1 32 - 48 mmHg   pO2, Arterial 189 (H) 83 - 108 mmHg   Bicarbonate 26.2 20.0 - 28.0 mmol/L   TCO2 27 22 - 32 mmol/L   O2 Saturation 100 %   Acid-Base Excess 2.0 0.0 - 2.0 mmol/L   Sodium 147 (H) 135 - 145 mmol/L   Potassium 4.0 3.5 - 5.1 mmol/L   Calcium, Ion 1.16 1.15 - 1.40 mmol/L   HCT 22.0 (L) 36.0 - 46.0 %   Hemoglobin 7.5 (L) 12.0 - 15.0 g/dL   Patient temperature 63.2 C    Sample type ARTERIAL   POCT Activated clotting time     Status: None   Collection Time: 10/23/23  7:38 AM  Result Value Ref Range   Activated Clotting Time 187 seconds    Comment: Reference range 74-137 seconds for patients not on  anticoagulant therapy.  POCT Activated clotting time     Status: None   Collection Time: 10/23/23  9:23 AM  Result Value Ref Range   Activated Clotting Time 199 seconds    Comment: Reference range 74-137 seconds for patients not on anticoagulant therapy.    DG  Chest 1 View Result Date: 10/23/2023 CLINICAL DATA:  ECMO patient. EXAM: CHEST  1 VIEW COMPARISON:  10/22/2023 FINDINGS: Endotracheal tube tip is 3.7 cm above the base of the carina. Right IJ central line tip overlies the right atrium. Left IJ pulmonary artery catheter tip is in the right lower lobe pulmonary artery. The cardio pericardial silhouette is enlarged. Low volume film with left base collapse/consolidation and vascular congestion. Telemetry leads overlie the chest. IMPRESSION: 1. Low volume film with left base collapse/consolidation and vascular congestion. 2. Support apparatus as above. Electronically Signed   By: Camellia Candle M.D.   On: 10/23/2023 07:51   DG CHEST PORT 1 VIEW Result Date: 10/22/2023 CLINICAL DATA:  Hypoxia. EXAM: PORTABLE CHEST 1 VIEW COMPARISON:  10/22/2023 and CT chest 10/20/2023. FINDINGS: Endotracheal tube terminates 3.2 cm above the carina. Nasogastric tube is followed into the stomach with the tip projecting beyond the inferior margin of the image. Right IJ central line tip is in the right atrium. Left IJ Swan-Ganz catheter tip is in the interlobar pulmonary artery. Deferred per later pad overlies the lower left chest and upper abdomen. Heart is enlarged. Lungs are low in volume with left basilar atelectasis. There may be a small left pleural effusion. IMPRESSION: Low lung volumes with left basilar atelectasis and probable small left pleural effusion. Electronically Signed   By: Newell Eke M.D.   On: 10/22/2023 13:03   ECHOCARDIOGRAM LIMITED Result Date: 10/22/2023    ECHOCARDIOGRAM LIMITED REPORT   [name] Date of Exam: 10/22/2023 Medical Rec #:  [id number]     Height:       67.0 in  Accession #:    [id number]    Weight:       233.5 lb Date of Birth:  08-Mar-1976     BSA:          2.160 m Patient Age:    47 years      BP:           96/63 mmHg Patient Gender: F             HR:           75 bpm. Exam Location:  Inpatient Procedure: Limited Echo, Cardiac Doppler and Color Doppler (Both Spectral and            Color Flow Doppler were utilized during procedure). Indications:    I26.02 Pulmonary embolus  History:        Patient has prior history of Echocardiogram examinations, most                 recent 10/20/2023. Risk Factors:Hypertension.  Sonographer:    Damien Senior RDCS Referring Phys: [phone] EZRA RAMAN [facility]  Sonographer Comments: Limited ECMO eval, Dr Rolan at bedside IMPRESSIONS  1. Patient on V-A ECMO via fem-fem cannulation flowing 4.3LPM during study.  2. Left ventricular ejection fraction, by estimation, is 40 to 45%. The left ventricle has mildly decreased function. The left ventricle has no regional wall motion abnormalities.  3. Right ventricular systolic function is severely reduced. The right ventricular size is mild to moderately enlarged. RVOT VTI 10.8cm. TAPSE 19.73mm. The basal RV at the level of the tricuspid valve has appropriate longitudinal motion, however, the mid to distal RV approaching the RVOT is dilated and hypokinetic consistent with acute cor pulmonale in the setting of pulmonary embolism. There is mild improvement in RV function when decreasing flows to 3LPM.  4. Tricuspid valve regurgitation is  mild to moderate. FINDINGS  Left Ventricle: Left ventricular ejection fraction, by estimation, is 40 to 45%. The left ventricle has mildly decreased function. The left ventricle has no regional wall motion abnormalities. Right Ventricle: The right ventricular size is mildly enlarged. Right ventricular systolic function is severely reduced. Tricuspid Valve: The tricuspid valve is normal in structure. Tricuspid valve regurgitation is mild to moderate. Additional Comments: Spectral  Doppler performed. Color Doppler performed.  RIGHT VENTRICLE RV S prime:     16.20 cm/s TAPSE (M-mode): 2.0 cm AORTIC VALVE LVOT Vmax:   68.70 cm/s LVOT Vmean:  53.700 cm/s LVOT VTI:    0.130 m  SHUNTS Systemic VTI: 0.13 m Aditya Sabharwal Electronically signed by Ria Commander Signature Date/Time: 10/22/2023/9:10:50 AM    Final    DG CHEST PORT 1 VIEW Result Date: 10/22/2023 CLINICAL DATA:  [id number]. Patient receiving ECMO. Follow-up lung status. Most recently with acute pulmonary emboli. EXAM: PORTABLE CHEST 1 VIEW COMPARISON:  Portable chest yesterday at 5:40 a.m. FINDINGS: 5:24 a.m. ETT tip is 3.7 cm from the carina, NGT well inside the stomach but the side hole and tip are not in view. Left IJ Swan-Ganz catheter has been advanced into right lower lobe pulmonary artery. Both a right IJ central line and ECMO catheter extending up from below terminate at the expected level of the inferior cavoatrial junction. Electrical pads overlie the field on the left. There is stable cardiomegaly. Increased opacity left lower lobe which could be atelectasis or consolidation. Small new left pleural effusion. Remaining hypoexpanded lungs appear clear. The mediastinum is stable. Central vascular prominence is present without overt edema. Thoracic cage is intact. IMPRESSION: 1. Increased opacity left lower lobe which could be atelectasis or consolidation. Small new left pleural effusion. 2. Stable cardiomegaly and central vascular prominence without overt edema. 3. Support apparatus as above. Electronically Signed   By: Francis Quam M.D.   On: 10/22/2023 06:01   IR THROMB F/U EVAL ART/VEN FINAL DAY (MS) Result Date: 10/21/2023 INDICATION: Submassive PE. RV/LV ratio 1.4. Failed conservative management, with hypoxemia requiring ECMO cannulation. Post initiation of bilateral pulmonary arterial thrombolysis on 10/20/2023. Patient has completed prescribed course of bilateral pulmonary arterial lytic infusion and presents today  for repeat pulmonary arterial pressure measurements prior to infusion catheter removal. EXAM: PULMONARY ARTERIAL PRESSURE MEASUREMENT and INFUSION CATHETER(S) REMOVAL COMPARISON:  Chest XR, 10/21/2023. IR PE lytic catheter placement, 10/20/2023. CT AP MEDICATIONS: None CONTRAST:  None FLUOROSCOPY TIME:  None COMPLICATIONS: None immediate. TECHNIQUE: The patient was positioned supine on her hospital bed. Review of this morning's chest radiograph demonstrates unchanged positioning of bilateral pulmonary arterial infusion catheters with tip of the right-sided pulmonary arterial infusion catheter approximately 5-10 cm to the expected outflow of the main pulmonary artery. As such, the right pulmonary arterial catheter's infusion wire was removed and the multi side-hole infusion catheter was retracted to the estimated location of the main pulmonary artery. Pressure measurements were acquired from this location. At this point, the procedure was terminated. All wires, catheters and sheaths were removed from the patient. Hemostasis was achieved at the right groin access site with manual compression. A dressing was placed. The patient tolerated the procedure well without immediate postprocedural complication. Procedure assisted by [facility], IR RT and Harlene Lesches, IR RT. FINDINGS: Acquired pressure measurements as follows: Preprocedural main pulmonary artery-58/25; mean-34 mmHg (normal: < 25/10) Postprocedural main pulmonary artery-25/15; mean-20 mmHg IMPRESSION: Successful bilateral catheter-directed pulmonary arterial thrombolysis with (14 mmHg) reduction in pressure within the main pulmonary artery.  Thom Hall, MD [facility] Radiology Electronically Signed   By: Thom Hall M.D.   On: 10/21/2023 12:57     Assessment/Plan: Abnormal uterine bleeding: in the setting of fibroid uterus and suspected adenomyosis, exacerbated by active anticoagulation due to PE. Patient  has already undergone uterine artery embolization but continues to bleed. Pelvic exam deferred as patient is sedated and intubated. Recommend medical management with potent progestin, megace 40 mg BID. Can decrease this to daily if bleeding decreases/stops. Can also increase as needed. Not a surgical candidate and not a great candidate for IV estrogen in the setting of acute hemorrhage given her active PE. She is on anticoagulation currently so any minimal risk of thrombosis posed by potent progestin is greatly outweighed by the benefit of gaining control of her bleeding. This was discussed with her primary team in detail. Patient will also need uterine sampling to rule out hyperplasia/cancer however this can be deferred until she is more stable.   -- megace 40 mg BID (titrate as needed)  -- recommend uterine sampling with biopsy when patient is more stable  Appreciate care of Anita Aguirre by her primary team  Please call [phone] [facility] OB/GYN Consult Attending Monday-Friday 8am - 5pm) or [phone] [facility] OB/GYN Attending On Call all day, every day) for any gynecologic concerns at any time.   Thank you for involving us  in the care of this patient.  Total consultation time including face-to-face time with patient, reviewing chart and documentation, consultation with ICU team: 65 minutes  Rollo ONEIDA Bring, MD, FACOG Obstetrician & Gynecologist, [facility] for [facility], [facility]

## 2023-10-23 NOTE — Progress Notes (Signed)
 Patient ID: [name], female   DOB: [birth year], 48 y.o.   MRN: [id number] ECLS support: Cannulation date 10/20/23 Indication: RVF 2/2 PE   Configuration: V-A ECMO   Drainage cannula: 25Fr Right femoral vein  Return cannula: 19Fr left femoral artery Antegrade perfusion: 6Fr braided sheath SFA   Pump speed: 3300 rpm Pump flow: 3.3 L/min   Sweep gas: 2   Circuit check: No thrombus Anticoagulant: Bivalirudin.  Anticoagulation targets: 50-80   Changes in support:  - Echo today, full wean of support with plan to decannulate tomorrow if tolerates   Anticipated goals/duration of support:  24-48 hrs  Ezra Shuck 10/23/2023 7:55 AM

## 2023-10-23 NOTE — Progress Notes (Addendum)
 PHARMACY - ANTICOAGULATION CONSULT NOTE  Pharmacy Consult for bivalirudin Indication: pulmonary embolus/ECMO  Allergies  Allergen Reactions   Wellbutrin [Bupropion] Hives, Itching and Other (See Comments)    Skin crawling sensation    Patient Measurements: Weight: 109.9 kg (242 lb 4.6 oz)  Vital Signs: Temp: 98.2 F (36.8 C) (07/01 1115) Temp Source: Bladder (07/01 0800) BP: 108/54 (07/01 0340) Pulse Rate: 82 (07/01 1115)  Labs: Recent Labs    10/20/23 2344 10/21/23 0055 10/21/23 0508 10/21/23 0754 10/21/23 1549 10/21/23 1601 10/21/23 1735 10/21/23 1813 10/22/23 0410 10/22/23 0413 10/22/23 1618 10/22/23 1630 10/23/23 0358 10/23/23 0405 10/23/23 0729 10/23/23 0948 10/23/23 1115  HGB 9.0*   < > 8.9*  8.5*   < >  --    < > 8.4*   < > 8.4*   < > 7.5*   < > 8.0*   < > 7.5* 7.8* 8.8*  HCT 28.3*   < > 28.3*  25.0*   < >  --    < > 26.6*   < > 27.5*   < > 25.1*   < > 25.9*   < > 22.0* 23.0* 26.0*  PLT 249  --  233  --   --   --  192  --  178  --   --   --  146*  --   --   --   --   APTT  --   --   --    < >  --   --   --    < > 63*  --  69*  --  64*  --   --   --   --   LABPROT 16.9*  --   --   --   --   --   --   --   --   --   --   --   --   --   --   --   --   INR 1.3*  --   --   --   --   --   --   --   --   --   --   --   --   --   --   --   --   HEPARINUNFRC 0.46  --  0.49  --  <0.10*  --   --   --   --   --   --   --   --   --   --   --   --   CREATININE 1.16*  --  1.05*  --   --   --  0.90  --  0.92  --  0.90  --  0.87  --   --   --   --    < > = values in this interval not displayed.    Estimated Creatinine Clearance: 101 mL/min (by C-G formula based on SCr of 0.87 mg/dL).   Medical History: Past Medical History:  Diagnosis Date   ADD (attention deficit disorder)    Arthritis 04/24/2008   Diffuse cystic mastopathy 04/24/2010   LEFT    Hypertension    Hypothyroidism    Mastitis     Medications:  Scheduled:   Chlorhexidine Gluconate Cloth  6 each  Topical Daily   famotidine  20 mg Per Tube BID   feeding supplement (PROSource TF20)  60 mL Per Tube BID   insulin aspart  0-20 Units Subcutaneous Q4H  lactulose  20 g Per Tube TID   mouth rinse  15 mL Mouth Rinse Q2H   polyethylene glycol  17 g Per Tube BID   senna-docusate  2 tablet Per Tube Daily   sodium chloride  flush  3 mL Intravenous Q12H   thiamine  100 mg Per Tube Daily   Infusions:   albumin human Stopped (10/22/23 1227)   bivalirudin (ANGIOMAX) 250 mg in sodium chloride  0.9 % 500 mL (0.5 mg/mL) infusion 0.065 mg/kg/hr (10/23/23 1100)   feeding supplement (VITAL 1.5 CAL) Stopped (10/23/23 0430)   furosemide (LASIX) 200 mg in dextrose 5 % 100 mL (2 mg/mL) infusion 4 mg/hr (10/23/23 1100)   HYDROmorphone 5.5 mg/hr (10/23/23 1100)   meropenem (MERREM) IV Stopped (10/23/23 9376)   midazolam 10 mg/hr (10/23/23 1100)   milrinone 0.125 mcg/kg/min (10/23/23 1100)   vancomycin Stopped (10/23/23 0449)    Assessment: 47 YOF admitted with submassive PE with right heart strain. RV/LV ration 1.4. CT with bilateral PE with severe clot burden. Patient not on anticoagulation prior to hospitalization. Patient also has ongoing vaginal bleeding due to uterine fibroids, noted to be a chronic issue. Pharmacy initially consulted for heparin dosing. Patient later decompensated, started on ECMO on 6/28 PM.  -s/p catheter directed alteplase overnight 6/28-6/29, heparin was paused for ~1 hour then resumed at 0100 until 0745 on 10/21/23.  Heparin level was therapeutic at 0.49 in the morning. Transition to bivalirudin 6/29 AM.   -aPTT 64 sec on upper end of goal range on 0.07 mg/kg/h.  Hgb 8, pltc 146 trending down.  Received 1u PRBC yesterday (6/30).  LDH 361 (up), fibrinogen 504 (up) -Plan trial ECMO wean today with tentative decannulation tomorrow.  Received 2000 units of heparin at 0900  -Patient having ongoing vaginal bleeding due to uterine fibroids > s/p embolization 6/28 but still needing 3-4 pad  changes daily per RN.    Goal of Therapy:  aPTT 50-70 seconds Monitor platelets by anticoagulation protocol: Yes   Plan:  Bivalirudin slightly to 0.065 mg/kg/hr 4h aPTT aPTT/CBC q12 5a/5p, LDH, fibrinogen daily Monitor CBC and s/sx of bleeding daily  ADDENDUM - 1200 aPTT 72 sec, impacted by heparin bolus this AM.  Hgb 8.2, pltc 149 stable from this morning.  F/u repeat aPTT@1500 .   Maurilio Fila, PharmD Clinical Pharmacist 10/23/2023  11:36 AM

## 2023-10-24 ENCOUNTER — Inpatient Hospital Stay (HOSPITAL_COMMUNITY): Payer: MEDICAID

## 2023-10-24 ENCOUNTER — Inpatient Hospital Stay (HOSPITAL_COMMUNITY): Payer: Self-pay | Admitting: Anesthesiology

## 2023-10-24 ENCOUNTER — Encounter (HOSPITAL_COMMUNITY)
Admission: EM | Disposition: A | Discharge status: Home / Self Care | Payer: Self-pay | Source: Other Acute Inpatient Hospital | Attending: Internal Medicine

## 2023-10-24 ENCOUNTER — Encounter (HOSPITAL_COMMUNITY): Payer: Self-pay

## 2023-10-24 ENCOUNTER — Other Ambulatory Visit: Payer: Self-pay

## 2023-10-24 DIAGNOSIS — I743 Embolism and thrombosis of arteries of the lower extremities: Secondary | ICD-10-CM

## 2023-10-24 DIAGNOSIS — I519 Heart disease, unspecified: Secondary | ICD-10-CM

## 2023-10-24 DIAGNOSIS — I110 Hypertensive heart disease with heart failure: Secondary | ICD-10-CM

## 2023-10-24 DIAGNOSIS — I509 Heart failure, unspecified: Secondary | ICD-10-CM

## 2023-10-24 DIAGNOSIS — M79661 Pain in right lower leg: Secondary | ICD-10-CM

## 2023-10-24 DIAGNOSIS — Z87891 Personal history of nicotine dependence: Secondary | ICD-10-CM

## 2023-10-24 DIAGNOSIS — S75092A Other specified injury of femoral artery, left leg, initial encounter: Secondary | ICD-10-CM

## 2023-10-24 DIAGNOSIS — E039 Hypothyroidism, unspecified: Secondary | ICD-10-CM

## 2023-10-24 LAB — COOXEMETRY PANEL
Carboxyhemoglobin: 2.3 % — ABNORMAL HIGH (ref 0.5–1.5)
Carboxyhemoglobin: 2.5 % — ABNORMAL HIGH (ref 0.5–1.5)
Methemoglobin: 1 % (ref 0.0–1.5)
Methemoglobin: <0.7 % (ref 0.0–1.5)
O2 Saturation: 73.3 %
O2 Saturation: 83.4 %
Total hemoglobin: 8.8 g/dL — ABNORMAL LOW (ref 12.0–16.0)
Total hemoglobin: 9.1 g/dL — ABNORMAL LOW (ref 12.0–16.0)

## 2023-10-24 LAB — POCT I-STAT 7, (LYTES, BLD GAS, ICA,H+H)
Acid-Base Excess: 3 mmol/L — ABNORMAL HIGH (ref 0.0–2.0)
Acid-Base Excess: 5 mmol/L — ABNORMAL HIGH (ref 0.0–2.0)
Acid-Base Excess: 5 mmol/L — ABNORMAL HIGH (ref 0.0–2.0)
Acid-Base Excess: 7 mmol/L — ABNORMAL HIGH (ref 0.0–2.0)
Acid-Base Excess: 7 mmol/L — ABNORMAL HIGH (ref 0.0–2.0)
Bicarbonate: 29 mmol/L — ABNORMAL HIGH (ref 20.0–28.0)
Bicarbonate: 29.4 mmol/L — ABNORMAL HIGH (ref 20.0–28.0)
Bicarbonate: 30.1 mmol/L — ABNORMAL HIGH (ref 20.0–28.0)
Bicarbonate: 30.5 mmol/L — ABNORMAL HIGH (ref 20.0–28.0)
Bicarbonate: 30.6 mmol/L — ABNORMAL HIGH (ref 20.0–28.0)
Calcium, Ion: 1.21 mmol/L (ref 1.15–1.40)
Calcium, Ion: 1.23 mmol/L (ref 1.15–1.40)
Calcium, Ion: 1.24 mmol/L (ref 1.15–1.40)
Calcium, Ion: 1.24 mmol/L (ref 1.15–1.40)
Calcium, Ion: 1.29 mmol/L (ref 1.15–1.40)
HCT: 23 % — ABNORMAL LOW (ref 36.0–46.0)
HCT: 24 % — ABNORMAL LOW (ref 36.0–46.0)
HCT: 24 % — ABNORMAL LOW (ref 36.0–46.0)
HCT: 24 % — ABNORMAL LOW (ref 36.0–46.0)
HCT: 25 % — ABNORMAL LOW (ref 36.0–46.0)
Hemoglobin: 7.8 g/dL — ABNORMAL LOW (ref 12.0–15.0)
Hemoglobin: 8.2 g/dL — ABNORMAL LOW (ref 12.0–15.0)
Hemoglobin: 8.2 g/dL — ABNORMAL LOW (ref 12.0–15.0)
Hemoglobin: 8.2 g/dL — ABNORMAL LOW (ref 12.0–15.0)
Hemoglobin: 8.5 g/dL — ABNORMAL LOW (ref 12.0–15.0)
O2 Saturation: 92 %
O2 Saturation: 98 %
O2 Saturation: 98 %
O2 Saturation: 98 %
O2 Saturation: 99 %
Patient temperature: 36.5
Patient temperature: 36.8
Patient temperature: 36.8
Patient temperature: 36.9
Patient temperature: 37
Potassium: 3.6 mmol/L (ref 3.5–5.1)
Potassium: 3.7 mmol/L (ref 3.5–5.1)
Potassium: 3.8 mmol/L (ref 3.5–5.1)
Potassium: 3.8 mmol/L (ref 3.5–5.1)
Potassium: 3.8 mmol/L (ref 3.5–5.1)
Sodium: 148 mmol/L — ABNORMAL HIGH (ref 135–145)
Sodium: 148 mmol/L — ABNORMAL HIGH (ref 135–145)
Sodium: 148 mmol/L — ABNORMAL HIGH (ref 135–145)
Sodium: 149 mmol/L — ABNORMAL HIGH (ref 135–145)
Sodium: 149 mmol/L — ABNORMAL HIGH (ref 135–145)
TCO2: 30 mmol/L (ref 22–32)
TCO2: 31 mmol/L (ref 22–32)
TCO2: 32 mmol/L (ref 22–32)
TCO2: 32 mmol/L (ref 22–32)
TCO2: 32 mmol/L (ref 22–32)
pCO2 arterial: 38.5 mmHg (ref 32–48)
pCO2 arterial: 38.8 mmHg (ref 32–48)
pCO2 arterial: 39.3 mmHg (ref 32–48)
pCO2 arterial: 40 mmHg (ref 32–48)
pCO2 arterial: 58 mmHg — ABNORMAL HIGH (ref 32–48)
pH, Arterial: 7.32 — ABNORMAL LOW (ref 7.35–7.45)
pH, Arterial: 7.474 — ABNORMAL HIGH (ref 7.35–7.45)
pH, Arterial: 7.485 — ABNORMAL HIGH (ref 7.35–7.45)
pH, Arterial: 7.499 — ABNORMAL HIGH (ref 7.35–7.45)
pH, Arterial: 7.503 — ABNORMAL HIGH (ref 7.35–7.45)
pO2, Arterial: 112 mmHg — ABNORMAL HIGH (ref 83–108)
pO2, Arterial: 67 mmHg — ABNORMAL LOW (ref 83–108)
pO2, Arterial: 91 mmHg (ref 83–108)
pO2, Arterial: 93 mmHg (ref 83–108)
pO2, Arterial: 94 mmHg (ref 83–108)

## 2023-10-24 LAB — CBC
HCT: 26.9 % — ABNORMAL LOW (ref 36.0–46.0)
HCT: 28.5 % — ABNORMAL LOW (ref 36.0–46.0)
Hemoglobin: 8.3 g/dL — ABNORMAL LOW (ref 12.0–15.0)
Hemoglobin: 8.7 g/dL — ABNORMAL LOW (ref 12.0–15.0)
MCH: 26.9 pg (ref 26.0–34.0)
MCH: 26.9 pg (ref 26.0–34.0)
MCHC: 30.9 g/dL (ref 30.0–36.0)
MCHC: 30.5 g/dL (ref 30.0–36.0)
MCV: 87.1 fL (ref 80.0–100.0)
MCV: 88.2 fL (ref 80.0–100.0)
Platelets: 151 10*3/uL (ref 150–400)
Platelets: 139 10*3/uL — ABNORMAL LOW (ref 150–400)
RBC: 3.09 MIL/uL — ABNORMAL LOW (ref 3.87–5.11)
RBC: 3.23 MIL/uL — ABNORMAL LOW (ref 3.87–5.11)
RDW: 19.1 % — ABNORMAL HIGH (ref 11.5–15.5)
RDW: 18.6 % — ABNORMAL HIGH (ref 11.5–15.5)
WBC: 19.8 10*3/uL — ABNORMAL HIGH (ref 4.0–10.5)
WBC: 20.8 10*3/uL — ABNORMAL HIGH (ref 4.0–10.5)
nRBC: 0.1 % (ref 0.0–0.2)
nRBC: 0.1 % (ref 0.0–0.2)

## 2023-10-24 LAB — BASIC METABOLIC PANEL WITH GFR
Anion gap: 11 (ref 5–15)
Anion gap: 11 (ref 5–15)
BUN: 15 mg/dL (ref 6–20)
BUN: 17 mg/dL (ref 6–20)
CO2: 27 mmol/L (ref 22–32)
CO2: 29 mmol/L (ref 22–32)
Calcium: 9 mg/dL (ref 8.9–10.3)
Calcium: 8.7 mg/dL — ABNORMAL LOW (ref 8.9–10.3)
Chloride: 109 mmol/L (ref 98–111)
Chloride: 109 mmol/L (ref 98–111)
Creatinine, Ser: 0.84 mg/dL (ref 0.44–1.00)
Creatinine, Ser: 0.96 mg/dL (ref 0.44–1.00)
GFR, Estimated: >60 mL/min (ref >60)
GFR, Estimated: >60 mL/min (ref >60)
Glucose, Bld: 131 mg/dL — ABNORMAL HIGH (ref 70–99)
Glucose, Bld: 135 mg/dL — ABNORMAL HIGH (ref 70–99)
Potassium: 3.7 mmol/L (ref 3.5–5.1)
Potassium: 3.6 mmol/L (ref 3.5–5.1)
Sodium: 147 mmol/L — ABNORMAL HIGH (ref 135–145)
Sodium: 149 mmol/L — ABNORMAL HIGH (ref 135–145)

## 2023-10-24 LAB — CG4 I-STAT (LACTIC ACID)
Lactic Acid, Venous: 1 mmol/L (ref 0.5–1.9)
Lactic Acid, Venous: 0.7 mmol/L (ref 0.5–1.9)
Lactic Acid, Venous: 0.6 mmol/L (ref 0.5–1.9)

## 2023-10-24 LAB — MAGNESIUM
Magnesium: 2.4 mg/dL (ref 1.7–2.4)
Magnesium: 2.4 mg/dL (ref 1.7–2.4)

## 2023-10-24 LAB — GLUCOSE, CAPILLARY
Glucose-Capillary: 113 mg/dL — ABNORMAL HIGH (ref 70–99)
Glucose-Capillary: 113 mg/dL — ABNORMAL HIGH (ref 70–99)
Glucose-Capillary: 114 mg/dL — ABNORMAL HIGH (ref 70–99)
Glucose-Capillary: 122 mg/dL — ABNORMAL HIGH (ref 70–99)
Glucose-Capillary: 122 mg/dL — ABNORMAL HIGH (ref 70–99)
Glucose-Capillary: 136 mg/dL — ABNORMAL HIGH (ref 70–99)

## 2023-10-24 LAB — PHOSPHORUS: Phosphorus: 3.3 mg/dL (ref 2.5–4.6)

## 2023-10-24 LAB — APTT
aPTT: 72 s — ABNORMAL HIGH (ref 24–36)
aPTT: 63 s — ABNORMAL HIGH (ref 24–36)
aPTT: 50 s — ABNORMAL HIGH (ref 24–36)

## 2023-10-24 LAB — CULTURE, BLOOD (ROUTINE X 2)
Culture: NO GROWTH
Culture: NO GROWTH
Special Requests: ADEQUATE

## 2023-10-24 LAB — HEMOGLOBIN AND HEMATOCRIT, BLOOD
HCT: 27.3 % — ABNORMAL LOW (ref 36.0–46.0)
HCT: 27.7 % — ABNORMAL LOW (ref 36.0–46.0)
HCT: 30.5 % — ABNORMAL LOW (ref 36.0–46.0)
Hemoglobin: 8.5 g/dL — ABNORMAL LOW (ref 12.0–15.0)
Hemoglobin: 8.6 g/dL — ABNORMAL LOW (ref 12.0–15.0)
Hemoglobin: 9.3 g/dL — ABNORMAL LOW (ref 12.0–15.0)

## 2023-10-24 LAB — ECHO INTRAOPERATIVE TEE: Weight: 3820.13 [oz_av]

## 2023-10-24 LAB — LACTATE DEHYDROGENASE: LDH: 573 U/L — ABNORMAL HIGH (ref 98–192)

## 2023-10-24 SURGERY — DECANNULATION FOR  ECMO (EXTRACORPOREAL MEMBRANE OXYGENATION)
Anesthesia: General | Site: Groin

## 2023-10-24 MED ORDER — SODIUM CHLORIDE 0.9 % IV SOLN
1.0000 g | Freq: Three times a day (TID) | INTRAVENOUS | Status: DC
  Administered 2023-10-24 – 2023-10-25 (×2): 1 g via INTRAVENOUS
  Filled 2023-10-24 (×2): qty 20

## 2023-10-24 MED ORDER — FENTANYL CITRATE (PF) 250 MCG/5ML IJ SOLN
INTRAMUSCULAR | Status: AC
Start: 2023-10-24 — End: 2023-10-24
  Filled 2023-10-24: qty 5

## 2023-10-24 MED ORDER — FENTANYL CITRATE (PF) 250 MCG/5ML IJ SOLN
INTRAMUSCULAR | Status: DC | PRN
  Administered 2023-10-24: 50 ug via INTRAVENOUS
  Administered 2023-10-24: 100 ug via INTRAVENOUS

## 2023-10-24 MED ORDER — HEPARIN (PORCINE) 25000 UT/250ML-% IV SOLN
1950.0000 [IU]/h | INTRAVENOUS | Status: DC
  Administered 2023-10-24: 1600 [IU]/h via INTRAVENOUS
  Administered 2023-10-25: 1950 [IU]/h via INTRAVENOUS
  Administered 2023-10-25: 1800 [IU]/h via INTRAVENOUS
  Administered 2023-10-26 – 2023-10-27 (×2): 1950 [IU]/h via INTRAVENOUS
  Filled 2023-10-24 (×5): qty 250

## 2023-10-24 MED ORDER — HEMOSTATIC AGENTS (NO CHARGE) OPTIME
TOPICAL | Status: DC | PRN
  Administered 2023-10-24: 1 via TOPICAL

## 2023-10-24 MED ORDER — MIDAZOLAM HCL 2 MG/2ML IJ SOLN
INTRAMUSCULAR | Status: DC | PRN
  Administered 2023-10-24: 2 mg via INTRAVENOUS

## 2023-10-24 MED ORDER — POTASSIUM CHLORIDE 10 MEQ/50ML IV SOLN
10.0000 meq | INTRAVENOUS | Status: AC
  Administered 2023-10-24 (×4): 10 meq via INTRAVENOUS
  Filled 2023-10-24 (×4): qty 50

## 2023-10-24 MED ORDER — VASOPRESSIN 20 UNIT/ML IV SOLN
INTRAVENOUS | Status: AC
  Filled 2023-10-24: qty 1

## 2023-10-24 MED ORDER — NOREPINEPHRINE 4 MG/250ML-% IV SOLN
INTRAVENOUS | Status: AC
  Filled 2023-10-24: qty 250

## 2023-10-24 MED ORDER — HEPARIN SODIUM (PORCINE) 1000 UNIT/ML IJ SOLN
INTRAMUSCULAR | Status: AC
  Filled 2023-10-24: qty 10

## 2023-10-24 MED ORDER — SODIUM CHLORIDE 0.9 % IV SOLN: INTRAVENOUS | Status: DC | PRN

## 2023-10-24 MED ORDER — PROTAMINE SULFATE 10 MG/ML IV SOLN
INTRAVENOUS | Status: DC | PRN
  Administered 2023-10-24: 35 mg via INTRAVENOUS
  Administered 2023-10-24: 15 mg via INTRAVENOUS

## 2023-10-24 MED ORDER — SODIUM CHLORIDE (PF) 0.9 % IJ SOLN
INTRAMUSCULAR | Status: AC
Start: 2023-10-24 — End: 2023-10-24
  Filled 2023-10-24: qty 20

## 2023-10-24 MED ORDER — HEPARIN 6000 UNIT IRRIGATION SOLUTION
Status: AC
  Filled 2023-10-24: qty 500

## 2023-10-24 MED ORDER — HEPARIN SODIUM (PORCINE) 1000 UNIT/ML IJ SOLN
INTRAMUSCULAR | Status: DC | PRN
  Administered 2023-10-24: 10000 [IU] via INTRAVENOUS

## 2023-10-24 MED ORDER — MIDAZOLAM HCL 2 MG/2ML IJ SOLN
INTRAMUSCULAR | Status: AC
Start: 2023-10-24 — End: 2023-10-24
  Filled 2023-10-24: qty 2

## 2023-10-24 MED ORDER — PROTAMINE SULFATE 10 MG/ML IV SOLN
INTRAVENOUS | Status: AC
  Filled 2023-10-24: qty 5

## 2023-10-24 MED ORDER — EPINEPHRINE HCL 5 MG/250ML IV SOLN IN NS
INTRAVENOUS | Status: AC
  Filled 2023-10-24: qty 250

## 2023-10-24 MED ORDER — ROCURONIUM BROMIDE 10 MG/ML (PF) SYRINGE
PREFILLED_SYRINGE | INTRAVENOUS | Status: DC | PRN
  Administered 2023-10-24 (×2): 30 mg via INTRAVENOUS
  Administered 2023-10-24: 70 mg via INTRAVENOUS

## 2023-10-24 MED ORDER — 0.9 % SODIUM CHLORIDE (POUR BTL) OPTIME
TOPICAL | Status: DC | PRN
  Administered 2023-10-24: 1000 mL

## 2023-10-24 MED ORDER — HEPARIN 6000 UNIT IRRIGATION SOLUTION
Status: DC | PRN
  Administered 2023-10-24: 1

## 2023-10-24 SURGICAL SUPPLY — 29 items
CATH EMB 4FR 80 (CATHETERS) IMPLANT
CLIP TI MEDIUM 24 (CLIP) ×1 IMPLANT
CLIP TI MEDIUM 6 (CLIP) ×1 IMPLANT
CLIP TI WIDE RED SMALL 24 (CLIP) ×1 IMPLANT
CLIP TI WIDE RED SMALL 6 (CLIP) ×1 IMPLANT
DRAPE INCISE IOBAN 85X60 (DRAPES) IMPLANT
DRESSING PEEL AND PLC PRVNA 13 (GAUZE/BANDAGES/DRESSINGS) IMPLANT
FELT TEFLON 1X6 (MISCELLANEOUS) IMPLANT
GLOVE BIOGEL PI IND STRL 8 (GLOVE) ×1 IMPLANT
GOWN STRL NON-REIN LRG LVL3 (GOWN DISPOSABLE) IMPLANT
GOWN STRL REUS W/ TWL XL LVL3 (GOWN DISPOSABLE) IMPLANT
GOWN STRL REUS W/TWL 2XL LVL3 (GOWN DISPOSABLE) ×1 IMPLANT
HEMOSTAT SNOW SURGICEL 2X4 (HEMOSTASIS) IMPLANT
KIT BASIN OR (CUSTOM PROCEDURE TRAY) ×1 IMPLANT
NS IRRIG 1000ML POUR BTL (IV SOLUTION) ×2 IMPLANT
PACK PERIPHERAL VASCULAR (CUSTOM PROCEDURE TRAY) ×1 IMPLANT
STAPLER SKIN PROX 35W (STAPLE) IMPLANT
STAPLER VISISTAT (STAPLE) IMPLANT
SURGIFLO W/THROMBIN 8M KIT (HEMOSTASIS) IMPLANT
SUT MNCRL AB 3-0 PS2 18 (SUTURE) IMPLANT
SUT PROLENE 5 0 C1 (SUTURE) ×1 IMPLANT
SUT PROLENE 6 0 BV (SUTURE) ×1 IMPLANT
SUT SILK 1 MH (SUTURE) IMPLANT
SUT SILK 2 0 SH (SUTURE) IMPLANT
SUT VIC AB 2-0 CT1 TAPERPNT 27 (SUTURE) IMPLANT
SUT VIC AB 3-0 SH 27X BRD (SUTURE) IMPLANT
SYR 3ML LL SCALE MARK (SYRINGE) IMPLANT
TOWEL GREEN STERILE (TOWEL DISPOSABLE) ×1 IMPLANT
TOWEL GREEN STERILE FF (TOWEL DISPOSABLE) ×1 IMPLANT

## 2023-10-24 NOTE — Progress Notes (Addendum)
 PHARMACY - ANTICOAGULATION CONSULT NOTE  Pharmacy Consult for bivalirudin > heparin Indication: pulmonary embolus/acute L DVT/ECMO >> decannulation 7/2  Allergies  Allergen Reactions   Wellbutrin [Bupropion] Hives, Itching and Other (See Comments)    Skin crawling sensation    Patient Measurements: Weight: 108.3 kg (238 lb 12.1 oz)  Vital Signs: Temp: 98.2 F (36.8 C) (07/02 1800) Temp Source: Bladder (07/02 1640) BP: 130/66 (07/02 1215) Pulse Rate: 102 (07/02 1800)  Labs: Recent Labs    10/23/23 1114 10/23/23 1115 10/23/23 1552 10/23/23 1600 10/24/23 0440 10/24/23 0442 10/24/23 1011 10/24/23 1152 10/24/23 1610 10/24/23 1616  HGB 8.2*   < > 7.9*   < > 8.3*   < > 8.5* 8.2* 8.7* 8.5*  HCT 26.4*   < > 26.0*   < > 26.9*   < > 27.3* 24.0* 28.5* 25.0*  PLT 149*  --   --   --  151  --   --   --  139*  --   APTT 72*   < >  --    < > 72*  --  63*  --  50*  --   CREATININE  --   --  0.92  --  0.84  --   --   --  0.96  --    < > = values in this interval not displayed.    Estimated Creatinine Clearance: 90.8 mL/min (by C-G formula based on SCr of 0.96 mg/dL).   Medical History: Past Medical History:  Diagnosis Date   ADD (attention deficit disorder)    Arthritis 04/24/2008   Diffuse cystic mastopathy 04/24/2010   LEFT    Hypertension    Hypothyroidism    Mastitis     Medications:  Scheduled:   sodium chloride    Intravenous Once   Chlorhexidine Gluconate Cloth  6 each Topical Daily   famotidine  20 mg Per Tube BID   feeding supplement (PROSource TF20)  60 mL Per Tube BID   insulin aspart  0-20 Units Subcutaneous Q4H   lactulose  20 g Per Tube TID   levothyroxine  88 mcg Per Tube Q0600   mouth rinse  15 mL Mouth Rinse Q2H   polyethylene glycol  17 g Per Tube BID   senna-docusate  2 tablet Per Tube Daily   sodium chloride  flush  3 mL Intravenous Q12H   thiamine  100 mg Per Tube Daily   Infusions:   albumin human Stopped (10/22/23 1227)   bivalirudin  (ANGIOMAX) 250 mg in sodium chloride  0.9 % 500 mL (0.5 mg/mL) infusion Stopped (10/24/23 1325)   feeding supplement (VITAL 1.5 CAL) Stopped (10/23/23 0430)   furosemide (LASIX) 200 mg in dextrose 5 % 100 mL (2 mg/mL) infusion 4 mg/hr (10/24/23 1800)   HYDROmorphone 5.5 mg/hr (10/24/23 1800)   meropenem (MERREM) IV     midazolam 10 mg/hr (10/24/23 1800)   milrinone 0.125 mcg/kg/min (10/24/23 1800)   potassium chloride      vancomycin 166.7 mL/hr at 10/24/23 1800    Assessment: 47 YOF admitted with submassive PE with right heart strain. RV/LV ration 1.4. CT with bilateral PE with severe clot burden. Patient not on anticoagulation prior to hospitalization. Patient also has ongoing vaginal bleeding due to uterine fibroids, noted to be a chronic issue. Pharmacy initially consulted for heparin dosing. Patient later decompensated, started on ECMO on 6/28 PM.  6/28 PM VA ECMO cannulation + catheter directed alteplase + uterine artery embolization (hx uterine fibroids); heparin overnight, switched to  bival 6/29 AM 7/2 VA ECMO decannulation  Previously therapeutic on heparin 1600 units/hr (HL 0.49).  Still with ongoing vaginal bleeding due to uterine fibroids - needing 3-4 pad changes per day. aPTT 50 at 1600 shortly after bival stopped.   Goal of Therapy:  Heparin level 0.3-0.7 units/ml Monitor platelets by anticoagulation protocol: Yes   Plan:  START heparin 1600 units/hr @1900  6 hour heparin level, CBC Monitor CBC and s/sx of bleeding daily  Rankin Sams, PharmD, BCPS, BCCCP Clinical Pharmacist

## 2023-10-24 NOTE — Progress Notes (Signed)
 Patient seen post ECMO decannulation.   HR 106 BP 129/60 (77) PA 54/28 39) CVP 12 CO/CI 7.68/3.86  Discussed with Dr. Rolan. Plan to continue milrinone and restart lasix gtt. Check co-ox, lactic acid and BMET.   Beckey Coe, AGACNP-BC  Advanced Heart Failure Team  10/24/23 4:16 PM

## 2023-10-24 NOTE — Progress Notes (Addendum)
 Nutrition Follow-up  DOCUMENTATION CODES:   Not applicable  INTERVENTION:   Recommend exchanging OGT for Cortrak pending ongoing nutrition support and possible extubation  Hold tube feeding via OGT, and re-assess ability to start s/p decannulation. If appropriate, re-initiate with goal rate of: Vital 1.5 at 50 ml/h (1200 ml per day) Re-initiate at trickle Prosource TF20 60 ml daily   Provides 1880 kcal, 101 gm protein, 825 ml free water daily    Continue Thiamine 100 mg daily for 7 days   NUTRITION DIAGNOSIS:  Inadequate oral intake related to inability to eat as evidenced by NPO status.  GOAL:  Patient will meet greater than or equal to 90% of their needs  MONITOR:  TF tolerance, Skin, Diet advancement, Labs, I & O's  REASON FOR ASSESSMENT:  Consult Enteral/tube feeding initiation and management  ASSESSMENT:   Pt with PMH significant for: ADD, arthritis, HTN, hypothyroidism, cystic mastopathy, mastitis and bleeding of uterine fibroids. Presented to [facility] with c/o worsening SOB and chest pain x2 days. Found to have pulmonary embolism w/ R heart strain. Not a good candidate for clot extraction. Transferred to [facility] and underwent VA ECMO and catheter directed thrombolysis.  6/28 transferred to [facility], 3 units PRBCs transfused; ECMO initiated 6/29 intubated; TFs initiated 6/30 still advancing TFs, tPA completed and catheter removed; ECHO: LV EF 40-45%; RV systolic function severely reduced 7/1 NPO r/t vomiting and c/f aspiration 7/2 ECMO decannulation   CVP 7 and continues on IV Lasix. Weight down despite notable edema to BLEs upon exam this morning. No BM yet, despite bowel regimen in place. Reglan ordered today.   OGT to suction and with 1.6L bilious output over last 24 hours. This is decreasing. TFs remain on hold since yesterday morning 2/2 vomiting w/ suspected aspiration event. Spoke with RN this morning, who also covered patient on Monday. Stated patient had similar  presentation Monday s/p med administration.   Discussed possibility of Cortrak placement today or tomorrow with PCCM as plan is for patient to remain intubated s/p decannulation today. Unlikely that Cortrak could be placed today d/t time of decannulation compared to end of service. RN reports potential for TEE while down for decannulation today, which would also preclude placement of Cortrak prior to decannulation. Discussed indication with MD as well as RN reports that patient with significant axiety at baseline and likely would tolerate placement better while intubated/sedated.   Admit weight: 106.2kg  Current weight: 108.3kg +moderate pitting edema to BLEs   Still unable to discern UBW as patient unable to report and no family at bedside. Weight stable compared to admission. Notable edema to BLEs on exam. Weight trending down despite this.    Intake/Output Summary (Last 24 hours) at 10/24/2023 1519 Last data filed at 10/24/2023 1514 Gross per 24 hour  Intake 2377.37 ml  Output 4160 ml  Net -1782.63 ml    Net IO Since Admission: -4,617.29 mL [10/24/23 1519]    Received potassium supplementation yesterday. Scheduled for today as well.  WBC elevated. Hgb stable.    Drains/Lines: OGT(gastric) placed 6/29 RIJ: CVC triple lumen R radial: a-line L groin: wound vac: no output yet; just placed s/p decannulation Foley catheter UOP: 2.7L x24 hours   Meds:  famotidine, SSI Novolog, lactulose, levothyroxine, Miralax, senna-docusate, thiamine, IV ABX Drips: 10 mEq KCl x4 Dilaudid 5.5 Versed 10  Labs Reviewed: Na+ 149>148>148 (H) K+ 3.8 PHOS 2.3 (L) Mg 2.7>2.1 Hgb 8.2>8.5>8.2 (L) WBC 17.8>20.5>19.8 (H) CBG ranges from 121-131 mg/dL over the last 24  hours HgbA1c 5.0 (09/2023)     Diet Order:   Diet Order             Diet NPO time specified  Diet effective midnight            EDUCATION NEEDS:  Not appropriate for education at this time  Skin:  Skin Assessment: Reviewed RN  Assessment  Last BM:  6/27  Height:  Ht Readings from Last 1 Encounters:  10/19/23 5' 7 (1.702 m)   Weight: Wt Readings from Last 1 Encounters:  10/24/23 108.3 kg   Ideal Body Weight:  61.4 kg  BMI:  Body mass index is 37.39 kg/m.  Estimated Nutritional Needs:   Kcal:  1700-1900 kcals  Protein:  95-110g  Fluid:  >1.9L/day  Blair Deaner MS, RD, LDN Registered Dietitian Clinical Nutrition RD Inpatient Contact Info in Amion

## 2023-10-24 NOTE — Progress Notes (Signed)
  Echocardiogram Echocardiogram Transesophageal has been performed.  LAMON MAXWELL 10/24/2023, 4:56 PM

## 2023-10-24 NOTE — Progress Notes (Signed)
 PHARMACY - ANTICOAGULATION CONSULT NOTE  Pharmacy Consult for bivalirudin Indication: pulmonary embolus/ECMO  Allergies  Allergen Reactions   Wellbutrin [Bupropion] Hives, Itching and Other (See Comments)    Skin crawling sensation    Patient Measurements: Weight: 108.3 kg (238 lb 12.1 oz)  Vital Signs: Temp: 98.2 F (36.8 C) (07/02 1015) Temp Source: Bladder (07/02 0800) BP: 105/59 (07/02 0357) Pulse Rate: 79 (07/02 1015)  Labs: Recent Labs    10/21/23 1549 10/21/23 1601 10/23/23 0358 10/23/23 0405 10/23/23 1114 10/23/23 1115 10/23/23 1552 10/23/23 1600 10/23/23 1952 10/23/23 2330 10/24/23 0440 10/24/23 0442 10/24/23 0742 10/24/23 1011  HGB  --    < > 8.0*   < > 8.2*   < > 7.9*   < >  --    < > 8.3* 7.8* 8.2* 8.5*  HCT  --    < > 25.9*   < > 26.4*   < > 26.0*   < >  --    < > 26.9* 23.0* 24.0* 27.3*  PLT  --    < > 146*  --  149*  --   --   --   --   --  151  --   --   --   APTT  --    < > 64*  --  72*   < >  --   --  62*  --  72*  --   --  63*  HEPARINUNFRC <0.10*  --   --   --   --   --   --   --   --   --   --   --   --   --   CREATININE  --    < > 0.87  --   --   --  0.92  --   --   --  0.84  --   --   --    < > = values in this interval not displayed.    Estimated Creatinine Clearance: 103.8 mL/min (by C-G formula based on SCr of 0.84 mg/dL).   Medical History: Past Medical History:  Diagnosis Date   ADD (attention deficit disorder)    Arthritis 04/24/2008   Diffuse cystic mastopathy 04/24/2010   LEFT    Hypertension    Hypothyroidism    Mastitis     Medications:  Scheduled:   sodium chloride    Intravenous Once   Chlorhexidine Gluconate Cloth  6 each Topical Daily   famotidine  20 mg Per Tube BID   feeding supplement (PROSource TF20)  60 mL Per Tube BID   insulin aspart  0-20 Units Subcutaneous Q4H   lactulose  20 g Per Tube TID   levothyroxine  88 mcg Per Tube Q0600   metoCLOPramide (REGLAN) injection  10 mg Intravenous Q6H   mouth rinse   15 mL Mouth Rinse Q2H   polyethylene glycol  17 g Per Tube BID   senna-docusate  2 tablet Per Tube Daily   sodium chloride  flush  3 mL Intravenous Q12H   thiamine  100 mg Per Tube Daily   Infusions:   albumin human Stopped (10/22/23 1227)   bivalirudin (ANGIOMAX) 250 mg in sodium chloride  0.9 % 500 mL (0.5 mg/mL) infusion 0.045 mg/kg/hr (10/24/23 1000)   feeding supplement (VITAL 1.5 CAL) Stopped (10/23/23 0430)   furosemide (LASIX) 200 mg in dextrose 5 % 100 mL (2 mg/mL) infusion 4 mg/hr (10/24/23 1000)   HYDROmorphone 5.5 mg/hr (10/24/23 1120)  meropenem (MERREM) IV Stopped (10/24/23 9383)   midazolam 10 mg/hr (10/24/23 1000)   milrinone 0.125 mcg/kg/min (10/24/23 1000)   vancomycin Stopped (10/24/23 0453)    Assessment: 47 YOF admitted with submassive PE with right heart strain. RV/LV ration 1.4. CT with bilateral PE with severe clot burden. Patient not on anticoagulation prior to hospitalization. Patient also has ongoing vaginal bleeding due to uterine fibroids, noted to be a chronic issue. Pharmacy initially consulted for heparin dosing. Patient later decompensated, started on ECMO on 6/28 PM.  -s/p catheter directed alteplase overnight 6/28-6/29, heparin was paused for ~1 hour then resumed at 0100 until 0745 on 10/21/23.  Heparin level was therapeutic at 0.49 in the morning. Transition to bivalirudin 6/29 AM.   -aPTT 63 sec on upper end of goal range on 0.045 mg/kg/h.  Hgb 8.5, pltc 151 - stable.  Received 1u PRBC for past two days.  LDH 361 > 573. -Planning decannulation today at 1300 - bival to be stopped 1h prior.  -Patient having ongoing vaginal bleeding due to uterine fibroids > s/p embolization 6/28 but still needing 3-4 pad changes daily per RN (2 overnight)  Goal of Therapy:  aPTT 50-70 seconds Monitor platelets by anticoagulation protocol: Yes   Plan:  Bivalirudin slightly to 0.04 mg/kg/hr Stop gtt @1200  aPTT/CBC q12 5a/5p, LDH, fibrinogen daily Monitor CBC and s/sx  of bleeding daily  Maurilio Fila, PharmD Clinical Pharmacist 10/24/2023  11:27 AM

## 2023-10-24 NOTE — Progress Notes (Addendum)
  Progress Note    10/24/2023 7:21 AM Hospital Day 4  Subjective:  intubated/sedated  afebrile  Vitals:   10/24/23 0630 10/24/23 0645  BP:    Pulse: 77 76  Resp: 16 16  Temp: 98.2 F (36.8 C) 98.2 F (36.8 C)  SpO2: 100% 100%    Physical Exam: General:  no distress Lungs:  intubated Extremities:  palpable right DP pulse;  + doppler signal left DP  CBC    Component Value Date/Time   WBC 19.8 (H) 10/24/2023 0440   RBC 3.09 (L) 10/24/2023 0440   HGB 7.8 (L) 10/24/2023 0442   HGB 12.9 05/19/2012 0947   HCT 23.0 (L) 10/24/2023 0442   HCT 37.4 05/19/2012 0947   PLT 151 10/24/2023 0440   PLT 294 05/19/2012 0947   MCV 87.1 10/24/2023 0440   MCV 91 05/19/2012 0947   MCH 26.9 10/24/2023 0440   MCHC 30.9 10/24/2023 0440   RDW 19.1 (H) 10/24/2023 0440   RDW 14.7 (H) 05/19/2012 0947   LYMPHSABS 2.6 10/07/2021 1235   LYMPHSABS 2.8 05/19/2012 0947   MONOABS 0.7 10/07/2021 1235   MONOABS 0.8 05/19/2012 0947   EOSABS 0.4 10/07/2021 1235   EOSABS 0.3 05/19/2012 0947   BASOSABS 0.0 10/07/2021 1235   BASOSABS 0.1 05/19/2012 0947    BMET    Component Value Date/Time   NA 149 (H) 10/24/2023 0442   NA 143 05/19/2012 0947   K 3.7 10/24/2023 0442   K 3.8 05/19/2012 0947   CL 109 10/24/2023 0440   CL 105 05/19/2012 0947   CO2 27 10/24/2023 0440   CO2 26 05/19/2012 0947   GLUCOSE 131 (H) 10/24/2023 0440   GLUCOSE 94 05/19/2012 0947   BUN 15 10/24/2023 0440   BUN 8 05/19/2012 0947   CREATININE 0.84 10/24/2023 0440   CREATININE 0.97 05/19/2012 0947   CALCIUM 9.0 10/24/2023 0440   CALCIUM 9.3 05/19/2012 0947   GFRNONAA >60 10/24/2023 0440   GFRNONAA >60 05/19/2012 0947   GFRAA >60 08/07/2018 2007   GFRAA >60 05/19/2012 0947    INR    Component Value Date/Time   INR 1.3 (H) 10/20/2023 2344     Intake/Output Summary (Last 24 hours) at 10/24/2023 0721 Last data filed at 10/24/2023 9361 Gross per 24 hour  Intake 2684.03 ml  Output 4310 ml  Net -1625.97 ml      Assessment/Plan:  48 y.o. female on ECMO for PE   Hospital Day 4  -plan for decannulation later today    Lucie Apt, PA-C [facility] [phone] 10/24/2023 7:21 AM  VASCULAR STAFF ADDENDUM: I have independently interviewed and examined the patient. I agree with the above.  Discussed with heart failure, ICU and family. Decannulation today   Fonda FORBES Rim MD [facility] Phone Number: [phone] 10/24/2023 12:46 PM

## 2023-10-24 NOTE — Transfer of Care (Signed)
 Immediate Anesthesia Transfer of Care Note  Patient: Anita Aguirre  Procedure(s) Performed: DECANNULATION FOR  ECMO (EXTRACORPOREAL MEMBRANE OXYGENATION) (Groin)  Patient Location: ICU  Anesthesia Type:General  Level of Consciousness: sedated and Patient remains intubated per anesthesia plan  Airway & Oxygen Therapy: Patient remains intubated per anesthesia plan and Patient placed on Ventilator (see vital sign flow sheet for setting)  Post-op Assessment: Report given to RN and Post -op Vital signs reviewed and stable  Post vital signs: Reviewed and stable  Last Vitals:  Vitals Value Taken Time  BP    Temp 36.8 C 10/24/23 15:48  Pulse 103 10/24/23 15:48  Resp 16 10/24/23 15:48  SpO2 93 % 10/24/23 15:48  Vitals shown include unfiled device data.  Last Pain:  Vitals:   10/24/23 1200  TempSrc: Bladder  PainSc:          Complications: No notable events documented.

## 2023-10-24 NOTE — Progress Notes (Signed)
 Patient ID: Anita Aguirre, female   DOB: 02/19/1976, 48 y.o.   MRN: [id number]     Advanced Heart Failure Rounding Note  Cardiologist: None  Chief Complaint: VA ECMO Subjective:    No pressors, stable MAP.   I/Os -1626 with weight down, on Lasix 4 mg/hr. CXR with lungs more expanded today.   Still with some vaginal oozing, hgb 8.3 today.    Remains on empiric vancomycin/meropenem.   Swan: PA 34/18 RA 7  VA ECMO Flow 2.8 L/min Speed 2876 rpm Pven -14 DeltaP 24 Sweep 2 ABG 7.47/39/93 Lactate 1.0 LDH 361 => 573 PTT 72 on bivalirudin goal 50-80 Co-ox 73%  Vent FiO2 0.5  Objective:   Weight Range: 108.3 kg Body mass index is 37.39 kg/m.   Vital Signs:   Temp:  [98.2 F (36.8 C)-98.6 F (37 C)] 98.4 F (36.9 C) (07/02 0806) Pulse Rate:  [75-88] 82 (07/02 0806) Resp:  [16-29] 29 (07/02 0806) BP: (105-117)/(57-60) 105/59 (07/02 0357) SpO2:  [96 %-100 %] 100 % (07/02 0806) Arterial Line BP: (109-127)/(54-62) 126/61 (07/02 0806) FiO2 (%):  [40 %-50 %] 50 % (07/02 0850) Weight:  [108.3 kg] 108.3 kg (07/02 0500) Last BM Date : 11/18/23  Weight change: Filed Weights   10/22/23 0500 10/23/23 0600 10/24/23 0500  Weight: 105.9 kg 109.9 kg 108.3 kg    Intake/Output:   Intake/Output Summary (Last 24 hours) at 10/24/2023 0951 Last data filed at 10/24/2023 0840 Gross per 24 hour  Intake 2482.91 ml  Output 3875 ml  Net -1392.09 ml      Physical Exam    General: Sedated on vent.  Neck: internal jugular catheters, no thyromegaly or thyroid nodule.  Lungs: Clear to auscultation bilaterally with normal respiratory effort. CV: Nondisplaced PMI.  Heart regular S1/S2, no S3/S4, no murmur.  1+ ankle edema.   Abdomen: Soft, nontender, no hepatosplenomegaly, no distention.  Skin: Intact without lesions or rashes.  Neurologic: Sedated on vent.  Extremities: No clubbing or cyanosis.  HEENT: Normal.    Telemetry   NSR (personally reviewed)  Labs    CBC Recent Labs     10/23/23 1114 10/23/23 1115 10/24/23 0440 10/24/23 0442 10/24/23 0742  WBC 20.5*  --  19.8*  --   --   HGB 8.2*   < > 8.3* 7.8* 8.2*  HCT 26.4*   < > 26.9* 23.0* 24.0*  MCV 86.6  --  87.1  --   --   PLT 149*  --  151  --   --    < > = values in this interval not displayed.   Basic Metabolic Panel Recent Labs    93/69/74 1618 10/22/23 1630 10/23/23 0358 10/23/23 0405 10/23/23 1552 10/23/23 1600 10/23/23 2330 10/24/23 0039 10/24/23 0440 10/24/23 0442 10/24/23 0742  NA 144   < > 145   < > 144   < >  --    < > 147* 149* 148*  K 4.2   < > 3.7   < > 3.7   < >  --    < > 3.7 3.7 3.8  CL 108  --  109  --  107  --   --   --  109  --   --   CO2 25  --  32  --  26  --   --   --  27  --   --   GLUCOSE 96  --  160*  --  121*  --   --   --  131*  --   --   BUN 11  --  16  --  15  --   --   --  15  --   --   CREATININE 0.90  --  0.87  --  0.92  --   --   --  0.84  --   --   CALCIUM 8.0*  --  8.3*  --  8.8*  --   --   --  9.0  --   --   MG 2.1  --  2.1  --   --   --   --   --  2.4  --   --   PHOS 4.4  --   --   --   --   --  3.3  --   --   --   --    < > = values in this interval not displayed.   Liver Function Tests No results for input(s): AST, ALT, ALKPHOS, BILITOT, PROT, ALBUMIN in the last 72 hours.  No results for input(s): LIPASE, AMYLASE in the last 72 hours. Cardiac Enzymes No results for input(s): CKTOTAL, CKMB, CKMBINDEX, TROPONINI in the last 72 hours.  BNP: BNP (last 3 results) Recent Labs    10/20/23 0643  BNP 308.4*    ProBNP (last 3 results) No results for input(s): PROBNP in the last 8760 hours.   D-Dimer No results for input(s): DDIMER in the last 72 hours. Hemoglobin A1C No results for input(s): HGBA1C in the last 72 hours.  Fasting Lipid Panel No results for input(s): CHOL, HDL, LDLCALC, TRIG, CHOLHDL, LDLDIRECT in the last 72 hours. Thyroid Function Tests No results for input(s): TSH, T4TOTAL,  T3FREE, THYROIDAB in the last 72 hours.  Invalid input(s): FREET3  Other results:   Imaging    DG CHEST PORT 1 VIEW Result Date: 10/24/2023 CLINICAL DATA:  ECMO patient. EXAM: PORTABLE CHEST 1 VIEW COMPARISON:  10/23/2023 FINDINGS: Endotracheal tube tip is 4.1 cm above the base of the carina. The NG tube passes into the stomach although the distal tip position is not included on the film. Right IJ central line tip overlies the right atrium. Left IJ pulmonary artery catheter tip overlies the right lower lobe pulmonary artery. Low lung volumes with vascular congestion. No overt pulmonary edema or substantial pleural effusion. Probable retrocardiac atelectasis. IMPRESSION: Low lung volumes with vascular congestion and probable retrocardiac atelectasis. Electronically Signed   By: Camellia Candle M.D.   On: 10/24/2023 07:30   ECHOCARDIOGRAM LIMITED Result Date: 10/23/2023    ECHOCARDIOGRAM LIMITED REPORT   [name] Date of Exam: 10/23/2023 Medical Rec #:  [id number]     Height:       67.0 in Accession #:    [id number]    Weight:       242.3 lb Date of Birth:  28-Aug-1975     BSA:          2.194 m Patient Age:    47 years      BP:           108/56 mmHg Patient Gender: F             HR:           86 bpm. Exam Location:  Inpatient Procedure: Limited Echo (Both Spectral and Color Flow Doppler were utilized            during procedure). Indications:    I26.02 Pulmonary embolus  History:        Patient has prior history of Echocardiogram examinations, most                 recent 10/22/2023. Risk Factors:Hypertension.  Sonographer:    Damien Senior RDCS Referring Phys: [phone] EZRA GORMAN SHUCK  Sonographer Comments: ECMO wean, Arkeem Harts at bedside IMPRESSIONS  1. At baseline the right ventricle is decompressed, with normal overall systolic function, but with free wall hypokinesis. After ECMO wean, the right ventricle is mildly-moderately dilated, but still has overall normal systolic function, despite  more obvious free wall hypokinesis.  2. Limited study for ECMO wean.  3. Left ventricular ejection fraction, by estimation, is 65 to 70%. The left ventricle has normal function.  4. The inferior vena cava is normal in size with greater than 50% respiratory variability, suggesting right atrial pressure of 3 mmHg. FINDINGS  Left Ventricle: Left ventricular ejection fraction, by estimation, is 65 to 70%. The left ventricle has normal function. The left ventricular internal cavity size was normal in size. Pericardium: There is no evidence of pericardial effusion. Venous: The inferior vena cava is normal in size with greater than 50% respiratory variability, suggesting right atrial pressure of 3 mmHg. Additional Comments: At baseline the right ventricle is decompressed, with normal overall systolic function, but with free wall hypokinesis. After ECMO wean, the right ventricle is mildly-moderately dilated, but still has overall normal systolic function, despite more obvious free wall hypokinesis.  Jerel Croitoru MD Electronically signed by Jerel Balding MD Signature Date/Time: 10/23/2023/1:02:20 PM    Final      Medications:     Scheduled Medications:  sodium chloride    Intravenous Once   Chlorhexidine Gluconate Cloth  6 each Topical Daily   famotidine  20 mg Per Tube BID   feeding supplement (PROSource TF20)  60 mL Per Tube BID   insulin aspart  0-20 Units Subcutaneous Q4H   lactulose  20 g Per Tube TID   levothyroxine  88 mcg Per Tube Q0600   metoCLOPramide (REGLAN) injection  10 mg Intravenous Q6H   mouth rinse  15 mL Mouth Rinse Q2H   polyethylene glycol  17 g Per Tube BID   senna-docusate  2 tablet Per Tube Daily   sodium chloride  flush  3 mL Intravenous Q12H   thiamine  100 mg Per Tube Daily    Infusions:  albumin human Stopped (10/22/23 1227)   bivalirudin (ANGIOMAX) 250 mg in sodium chloride  0.9 % 500 mL (0.5 mg/mL) infusion 0.045 mg/kg/hr (10/24/23 0800)   feeding supplement (VITAL 1.5  CAL) Stopped (10/23/23 0430)   furosemide (LASIX) 200 mg in dextrose 5 % 100 mL (2 mg/mL) infusion 4 mg/hr (10/24/23 0800)   HYDROmorphone 5.5 mg/hr (10/24/23 0800)   meropenem (MERREM) IV Stopped (10/24/23 9383)   midazolam 10 mg/hr (10/24/23 0800)   milrinone 0.125 mcg/kg/min (10/24/23 0800)   potassium chloride  10 mEq (10/24/23 0834)   vancomycin Stopped (10/24/23 0453)    PRN Medications: albumin human, albuterol, docusate sodium, HYDROmorphone, iohexol, midazolam, ondansetron (ZOFRAN) IV, mouth rinse, mouth rinse, polyethylene glycol, sodium chloride  flush    Assessment/Plan   1. 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 CT chest with on 10/19/23 with bilateral pulmonary emboli with severe clot burden; clot is in the segmental and distal branches, not amenable to thrombectomy.  Cannulated for V-A ECMO fem-fem on 10/20/23 for RV failure. RV on 6/28 with LV EF 65-70%, D septum, RV function moderately reduced.  Most recent echo  7/1 with LV EF normal, RV mildly dysfunctional and mildly dilated, IVC small.  Stable on VA ECMO, no pressors. Flow now down to 2.8 L/min.  Tolerated echo-guided ECMO wean yesterday well (transiently clamped ECMO with stable RV appearance and PA pressure). CVP 7.   Of note, LDH is rising (up to 573 today).  - Plan ECMO decannulation by vascular surgery in the OR later today.  - Keep Lasix 4 mg/hr to keep I/Os negative.   2. Acute PE: With RV failure. Bilateral, received TPA to each PA by IR.  - Continue bivalirudin.  3. Uterine fibroids with bleeding: Now s/p uterine artery embolization 10/20/23. Still with oozing and clots.  - Follow CBC, repeat this afternoon => transfuse hgb < 8.   4. Acute hypoxemic respiratory failure:  Stable today, FiO2 back down to 0.5.  - Vent per CCM. - Empiric vancomycin/meropenem, plan to stop after ECMO decannulation.   CRITICAL CARE Performed by: Ezra Shuck  Total critical care time: 45 minutes  Critical care time was  exclusive of separately billable procedures and treating other patients.  Critical care was necessary to treat or prevent imminent or life-threatening deterioration.  Critical care was time spent personally by me on the following activities: development of treatment plan with patient and/or surrogate as well as nursing, discussions with consultants, evaluation of patient's response to treatment, examination of patient, obtaining history from patient or surrogate, ordering and performing treatments and interventions, ordering and review of laboratory studies, ordering and review of radiographic studies, pulse oximetry and re-evaluation of patient's condition.   Length of Stay: 4  Ezra Shuck, MD  10/24/2023, 9:51 AM  Advanced Heart Failure Team Pager [phone] (M-F; 7a - 5p)  Please contact [facility] for night-coverage after hours (5p -7a ) and weekends on [email or web address]

## 2023-10-24 NOTE — Progress Notes (Signed)
 Patient ID: Anita Aguirre, female   DOB: December 19, 1975, 48 y.o.   MRN: [id number]  I accompanied the patient to the OR for ECMO decannulation.  ECMO was clamped off successfully with minimal change in MAP, CVP, or PA pressure.  Patient remains on milrinone 0.125 mcg/kg/min, no pressors.  I did a TEE, RV was normal in size with mild systolic dysfunction.  There was minimal TR, normal LV size and systolic function, EF 55-60%, no significant MR or aortic insufficiency.  Trileaflet aortic valve.    Cannulas were removed by vascular surgery.   CRITICAL CARE Performed by: Ezra Shuck  Total critical care time: 45 minutes  Critical care time was exclusive of separately billable procedures and treating other patients.  Critical care was necessary to treat or prevent imminent or life-threatening deterioration.  Critical care was time spent personally by me on the following activities: development of treatment plan with patient and/or surrogate as well as nursing, discussions with consultants, evaluation of patient's response to treatment, examination of patient, obtaining history from patient or surrogate, ordering and performing treatments and interventions, ordering and review of laboratory studies, ordering and review of radiographic studies, pulse oximetry and re-evaluation of patient's condition.  Ezra Shuck 10/24/2023 2:14 PM

## 2023-10-24 NOTE — Anesthesia Postprocedure Evaluation (Signed)
 Anesthesia Post Note  Patient: Anita Aguirre  Procedure(s) Performed: DECANNULATION FOR  ECMO (EXTRACORPOREAL MEMBRANE OXYGENATION) (Groin)     Patient location during evaluation: ICU Anesthesia Type: General Level of consciousness: sedated and patient remains intubated per anesthesia plan Pain management: pain level controlled Vital Signs Assessment: post-procedure vital signs reviewed and stable Respiratory status: patient remains intubated per anesthesia plan Cardiovascular status: stable Postop Assessment: no apparent nausea or vomiting Anesthetic complications: no   No notable events documented.  Last Vitals:  Vitals:   10/24/23 1300 10/24/23 1640  BP:    Pulse:  (!) 105  Resp: 16 20  Temp:  36.6 C  SpO2:  96%    Last Pain:  Vitals:   10/24/23 1640  TempSrc: Bladder  PainSc:                  Debby FORBES Like

## 2023-10-24 NOTE — Progress Notes (Signed)
 Pharmacy Electrolyte Replacement  Recent Labs:  Recent Labs    10/23/23 2330 10/24/23 0039 10/24/23 1610 10/24/23 1616  K  --    < > 3.6 3.6  MG  --    < > 2.4  --   PHOS 3.3  --   --   --   CREATININE  --    < > 0.96  --    < > = values in this interval not displayed.    Low Critical Values (K </= 2.5, Phos </= 1, Mg </= 1) Present: None  MD Contacted: n/a  Plan: 40 mEq KCL (10 mEq IV x 4 runs).  Rankin Sams, PharmD, BCPS, BCCCP Clinical Pharmacist

## 2023-10-24 NOTE — Progress Notes (Signed)
 RT placed vent on standby. CRNA using ambu bag with a peep valve to ventilate patient to the OR.

## 2023-10-24 NOTE — Anesthesia Procedure Notes (Signed)
 Procedure Name: General with mask airway Date/Time: 10/24/2023 1:20 PM  Performed by: Emmitt Millman, CRNAPre-anesthesia Checklist: Patient identified, Emergency Drugs available, Suction available and Patient being monitored Patient Re-evaluated:Patient Re-evaluated prior to induction Induction Type: Inhalational induction with existing ETT

## 2023-10-24 NOTE — Progress Notes (Incomplete)
 PHARMACY - ANTICOAGULATION CONSULT NOTE  Pharmacy Consult for bivalirudin > heparin Indication: pulmonary embolus/acute L DVT/ECMO >> decannulation 7/2  Allergies  Allergen Reactions   Wellbutrin [Bupropion] Hives, Itching and Other (See Comments)    Skin crawling sensation    Patient Measurements: Weight: 108.3 kg (238 lb 12.1 oz)  Vital Signs: Temp: 98.4 F (36.9 C) (07/02 1245) Temp Source: Bladder (07/02 1200) BP: 130/66 (07/02 1215) Pulse Rate: 85 (07/02 1245)  Labs: Recent Labs    10/21/23 1549 10/21/23 1601 10/23/23 0358 10/23/23 0405 10/23/23 1114 10/23/23 1115 10/23/23 1552 10/23/23 1600 10/23/23 1952 10/23/23 2330 10/24/23 0440 10/24/23 0442 10/24/23 0742 10/24/23 1011 10/24/23 1152  HGB  --    < > 8.0*   < > 8.2*   < > 7.9*   < >  --    < > 8.3*   < > 8.2* 8.5* 8.2*  HCT  --    < > 25.9*   < > 26.4*   < > 26.0*   < >  --    < > 26.9*   < > 24.0* 27.3* 24.0*  PLT  --    < > 146*  --  149*  --   --   --   --   --  151  --   --   --   --   APTT  --    < > 64*  --  72*   < >  --   --  62*  --  72*  --   --  63*  --   HEPARINUNFRC <0.10*  --   --   --   --   --   --   --   --   --   --   --   --   --   --   CREATININE  --    < > 0.87  --   --   --  0.92  --   --   --  0.84  --   --   --   --    < > = values in this interval not displayed.    Estimated Creatinine Clearance: 103.8 mL/min (by C-G formula based on SCr of 0.84 mg/dL).   Medical History: Past Medical History:  Diagnosis Date   ADD (attention deficit disorder)    Arthritis 04/24/2008   Diffuse cystic mastopathy 04/24/2010   LEFT    Hypertension    Hypothyroidism    Mastitis     Medications:  Scheduled:   [MAR Hold] sodium chloride    Intravenous Once   [MAR Hold] Chlorhexidine Gluconate Cloth  6 each Topical Daily   EPINEPHrine NaCl       [MAR Hold] famotidine  20 mg Per Tube BID   [MAR Hold] feeding supplement (PROSource TF20)  60 mL Per Tube BID   [MAR Hold] insulin aspart  0-20  Units Subcutaneous Q4H   [MAR Hold] lactulose  20 g Per Tube TID   [MAR Hold] levothyroxine  88 mcg Per Tube Q0600   [MAR Hold] mouth rinse  15 mL Mouth Rinse Q2H   [MAR Hold] polyethylene glycol  17 g Per Tube BID   [MAR Hold] senna-docusate  2 tablet Per Tube Daily   [MAR Hold] sodium chloride  flush  3 mL Intravenous Q12H   [MAR Hold] thiamine  100 mg Per Tube Daily   Infusions:   [MAR Hold] albumin human Stopped (10/22/23 1227)   bivalirudin (ANGIOMAX)  250 mg in sodium chloride  0.9 % 500 mL (0.5 mg/mL) infusion Stopped (10/24/23 1325)   feeding supplement (VITAL 1.5 CAL) Stopped (10/23/23 0430)   furosemide (LASIX) 200 mg in dextrose 5 % 100 mL (2 mg/mL) infusion Stopped (10/24/23 1352)   HYDROmorphone 5.5 mg/hr (10/24/23 1314)   [MAR Hold] meropenem (MERREM) IV Stopped (10/24/23 9383)   midazolam 10 mg/hr (10/24/23 1314)   milrinone 0.125 mcg/kg/min (10/24/23 1314)   norepinephrine     [MAR Hold] vancomycin Stopped (10/24/23 0453)    Assessment: 47 YOF admitted with submassive PE with right heart strain. RV/LV ration 1.4. CT with bilateral PE with severe clot burden. Patient not on anticoagulation prior to hospitalization. Patient also has ongoing vaginal bleeding due to uterine fibroids, noted to be a chronic issue. Pharmacy initially consulted for heparin dosing. Patient later decompensated, started on ECMO on 6/28 PM.  6/28 PM VA ECMO cannulation + catheter directed alteplase + uterine artery embolization (hx uterine fibroids); heparin overnight, switched to bival 6/29 AM 7/2 VA ECMO decannulation  Previously therapeutic on heparin 1600 units/hr (HL 0.49).  Still with ongoing vaginal bleeding due to uterine fibroids - needing 3-4 pad changes per day  Goal of Therapy:  Heparin level 0.3-0.7 units/ml Monitor platelets by anticoagulation protocol: Yes   Plan:  START heparin 1600 units/hr @1900  6 hour heparin level, CBC Monitor CBC and s/sx of bleeding daily  Maurilio Fila,  PharmD Clinical Pharmacist 10/24/2023  3:19 PM

## 2023-10-24 NOTE — Anesthesia Preprocedure Evaluation (Addendum)
 Anesthesia Evaluation  Patient identified by MRN, date of birth, ID band Patient awake    Reviewed: Allergy & Precautions, Patient's Chart, lab work & pertinent test results  Airway Mallampati: Intubated       Dental  (+) Teeth Intact   Pulmonary former smoker    + decreased breath sounds      Cardiovascular hypertension, Pt. on medications  Rhythm:Regular Rate:Normal     Neuro/Psych  Headaches PSYCHIATRIC DISORDERS Anxiety        GI/Hepatic Neg liver ROS,GERD  ,,  Endo/Other  Hypothyroidism    Renal/GU negative Renal ROS     Musculoskeletal  (+) Arthritis ,    Abdominal   Peds  Hematology negative hematology ROS (+)   Anesthesia Other Findings   Reproductive/Obstetrics                              Anesthesia Physical Anesthesia Plan  ASA: 4  Anesthesia Plan: General   Post-op Pain Management:    Induction:   PONV Risk Score and Plan: Ondansetron and Midazolam  Airway Management Planned:   Additional Equipment:   Intra-op Plan:   Post-operative Plan: Post-operative intubation/ventilation  Informed Consent: I have reviewed the patients History and Physical, chart, labs and discussed the procedure including the risks, benefits and alternatives for the proposed anesthesia with the patient or authorized representative who has indicated his/her understanding and acceptance.     Consent reviewed with POA  Plan Discussed with: CRNA  Anesthesia Plan Comments:          Anesthesia Quick Evaluation

## 2023-10-24 NOTE — Progress Notes (Signed)
 Venous duplex lower ext  has been completed. Refer to [facility] under chart review to view preliminary results.   10/24/2023  12:24 PM Jaselle Pryer, Ricka BIRCH

## 2023-10-24 NOTE — Op Note (Addendum)
    NAME: Anita Aguirre    MRN: [id number] DOB: [birth year]    DATE OF OPERATION: 10/24/2023  PREOP DIAGNOSIS:    Heart failure requiring extracorporeal membrane oxygenation  POSTOP DIAGNOSIS:    Same  PROCEDURE:    ECMO decannulation Iliofemoral, femoral-popliteal embolectomy Primary repair left common femoral artery Primary repair superficial femoral artery Prevena vacuum dressing  SURGEON: Fonda FORBES Rim  ASSIST: Malvina New, MD, Curry Damme, [place]  ANESTHESIA: General  EBL: 250 mL  INDICATIONS:    Anita Aguirre is a 48 y.o. female who presented with a PE and right heart strain requiring extracorporeal membrane oxygenation.  She has improved, and now needs to the sheath removed from the groins.  After discussing the risks and benefits of ECMO decannulation, Jamar's family elected to proceed.  FINDINGS:   Fragile left common femoral artery Thrombus within the left common femoral artery  TECHNIQUE:   Patient was brought to the OR laid in the supine position.  General anesthesia induced the patient's prepped draped in standard fashion.  The case began with an oblique incision in the left groin.  This was carried down to the common femoral artery at the site of cannula insertion.  The femoral artery was controlled with use of vessel loop, more distally, the common femoral artery was also controlled with the use of a vessel loop, as well as what appeared to be a small branch.  Next, move even more distal to the superficial femoral artery reperfusion catheter.  A 6-0 Prolene suture was placed in a U-stitch fashion, and the reperfusion sheath was removed.    The patient was then heparinized.  The left sided arterial cannula was removed from the left common femoral artery.  There was thrombus appreciated, and therefore a #4 Fogarty embolectomy catheter was passed both proximally and distally.  Thrombus was returned.  There were several passes made until there was no further  thrombus appreciated.  There was excellent, pulsatile bleeding.  The artery was clamped proximally and distally, and interrupted 6-0 sutures were used to close the arteriotomy and transverse fashion.  Upon completion, there was an excellent pulse both proximal and distal.  An ultrasound was used to insonate demonstrating no significant stenosis.  Furthermore, a Doppler demonstrated excellent, multiphasic signals that did not change proximal and distal to the repair.  Once completed, there was an area on the apical surface of the common femoral artery immediately distal to the repair where the adventitia was thin which led to bleeding.  This was repaired using a felt pledgeted suture.  Upon completion, there was an excellent multiphasic signal at the dorsalis pedis.  Heparin was reversed.  Hemostasis was achieved and the wound irrigated with copious amounts of saline.  The wound bed was closed in layers using 2-0 Vicryl suture with staples and Prevena vacuum dressing at the level of the skin.  A U-stitch using 3-0 Monocryl was placed in the right groin at the site of the the left common femoral vein catheter.  The catheter was pulled and stitch tied down with manual pressure following.  The patient was taken back to the ICU intubated, with a small amount of inotropic support.   Fonda FORBES Rim, MD [facility] DATE OF DICTATION:   10/24/2023

## 2023-10-24 NOTE — Progress Notes (Signed)
 Patient ID: Anita Aguirre, female   DOB: 1975-10-15, 48 y.o.   MRN: [id number] ECLS support: Cannulation date 10/20/23 Indication: RVF 2/2 PE   Configuration: V-A ECMO   Drainage cannula: 25Fr Right femoral vein  Return cannula: 19Fr left femoral artery Antegrade perfusion: 6Fr braided sheath SFA   Pump speed: 2876 rpm Pump flow: 2.8 L/min   Sweep gas: 2   Circuit check: No thrombus Anticoagulant: Bivalirudin.  Anticoagulation targets: 50-80   Changes in support:  - Plan decannulation today   Ezra Shuck 10/24/2023 9:49 AM

## 2023-10-24 NOTE — Progress Notes (Signed)
 PHARMACY - ANTICOAGULATION CONSULT NOTE  Pharmacy Consult for bivalirudin Indication: pulmonary embolus/ECMO  Allergies  Allergen Reactions   Wellbutrin [Bupropion] Hives, Itching and Other (See Comments)    Skin crawling sensation    Patient Measurements: Weight: 109.9 kg (242 lb 4.6 oz)  Vital Signs: Temp: 98.4 F (36.9 C) (07/02 0500) Temp Source: Bladder (07/01 1850) BP: 105/59 (07/02 0357) Pulse Rate: 77 (07/02 0500)  Labs: Recent Labs    10/21/23 1549 10/21/23 1601 10/23/23 0358 10/23/23 0405 10/23/23 1114 10/23/23 1115 10/23/23 1452 10/23/23 1552 10/23/23 1600 10/23/23 1952 10/23/23 2330 10/24/23 0039 10/24/23 0440 10/24/23 0442  HGB  --    < > 8.0*   < > 8.2*   < >  --  7.9*   < >  --    < > 8.2* 8.3* 7.8*  HCT  --    < > 25.9*   < > 26.4*   < >  --  26.0*   < >  --    < > 24.0* 26.9* 23.0*  PLT  --    < > 146*  --  149*  --   --   --   --   --   --   --  151  --   APTT  --    < > 64*  --  72*  --  63*  --   --  62*  --   --  72*  --   HEPARINUNFRC <0.10*  --   --   --   --   --   --   --   --   --   --   --   --   --   CREATININE  --    < > 0.87  --   --   --   --  0.92  --   --   --   --  0.84  --    < > = values in this interval not displayed.    Estimated Creatinine Clearance: 104.6 mL/min (by C-G formula based on SCr of 0.84 mg/dL).   Medical History: Past Medical History:  Diagnosis Date   ADD (attention deficit disorder)    Arthritis 04/24/2008   Diffuse cystic mastopathy 04/24/2010   LEFT    Hypertension    Hypothyroidism    Mastitis     Medications:  Scheduled:   sodium chloride    Intravenous Once   Chlorhexidine Gluconate Cloth  6 each Topical Daily   famotidine  20 mg Per Tube BID   feeding supplement (PROSource TF20)  60 mL Per Tube BID   insulin aspart  0-20 Units Subcutaneous Q4H   lactulose  20 g Per Tube TID   levothyroxine  88 mcg Per Tube Q0600   metoCLOPramide (REGLAN) injection  10 mg Intravenous Q6H   mouth rinse  15  mL Mouth Rinse Q2H   polyethylene glycol  17 g Per Tube BID   senna-docusate  2 tablet Per Tube Daily   sodium chloride  flush  3 mL Intravenous Q12H   thiamine  100 mg Per Tube Daily   Infusions:   albumin human Stopped (10/22/23 1227)   bivalirudin (ANGIOMAX) 250 mg in sodium chloride  0.9 % 500 mL (0.5 mg/mL) infusion 0.055 mg/kg/hr (10/24/23 0045)   feeding supplement (VITAL 1.5 CAL) Stopped (10/23/23 0430)   furosemide (LASIX) 200 mg in dextrose 5 % 100 mL (2 mg/mL) infusion 4 mg/hr (10/23/23 2200)   HYDROmorphone 5.5 mg/hr (  10/24/23 0046)   meropenem (MERREM) IV Stopped (10/23/23 2141)   midazolam 10 mg/hr (10/24/23 0532)   milrinone 0.125 mcg/kg/min (10/24/23 0045)   vancomycin 1,250 mg (10/24/23 0323)    Assessment: 47 YOF admitted with submassive PE with right heart strain. RV/LV ration 1.4. CT with bilateral PE with severe clot burden. Patient not on anticoagulation prior to hospitalization. Patient also has ongoing vaginal bleeding due to uterine fibroids, noted to be a chronic issue. Pharmacy initially consulted for heparin dosing. Patient later decompensated, started on ECMO on 6/28 PM.  -s/p catheter directed alteplase overnight 6/28-6/29, heparin was paused for ~1 hour then resumed at 0100 until 0745 on 10/21/23.  Heparin level was therapeutic at 0.49 in the morning. Transition to bivalirudin 6/29 AM.   -aPTT 64 sec on upper end of goal range on 0.07 mg/kg/h.  Hgb 8, pltc 146 trending down.  Received 1u PRBC yesterday (6/30).  LDH 361 (up), fibrinogen 504 (up) -Plan trial ECMO wean today with tentative decannulation tomorrow.  Received 2000 units of heparin at 0900  -Patient having ongoing vaginal bleeding due to uterine fibroids > s/p embolization 6/28 but still needing 3-4 pad changes daily per RN.    7/2 AM update:  aPTT supra-therapeutic   Goal of Therapy:  aPTT 50-70 seconds Monitor platelets by anticoagulation protocol: Yes   Plan:  Decrease bivalirudin to 0.045  mg/kg/hr 4h aPTT aPTT/CBC q12 5a/5p, LDH, fibrinogen daily Monitor CBC and s/sx of bleeding daily  Lynwood Mckusick, PharmD, BCPS Clinical Pharmacist Phone: [phone]

## 2023-10-24 NOTE — Progress Notes (Signed)
 NAME:  Anita Aguirre, MRN:  [id number], DOB:  1976-01-01, LOS: 4 ADMISSION DATE:  10/20/2023, CONSULTATION DATE:  10/20/2023 REFERRING MD:  Dr Isadora, CHIEF COMPLAINT:  SOB/Chest pain  History of Present Illness:  48 y/o female with PMH for ADD (attention deficit disorder), Arthritis (04/24/2008), Diffuse cystic mastopathy (04/24/2010), Hypertension, Hypothyroidism,Mastitis and bleeding uterine fibroids who presented to [facility] with worsening SOB and chest pain for last 2 days worsening day of admission to hospital  Chest pain dull and not radiating.  She was found to have submassive Pulmonary embolism with right heart strain.  Her Troponin increased to 409, Lactic Acid down to 2.7 from 3.9. CT scan chest revealed, Bilateral pulmonary emboli with severe clot burden. 2. CT evidence of RIGHT ventricular strain. Positive for acute PE with CT evidence of right heart strain (RV/LV Ratio = 1.4) consistent with at least submassive (intermediate risk) PE. The presence of right heart strain has been associated with an increased risk of morbidity and mortality. Please refer to the Code PE Focused order set in EPIC. 3. Small pulmonary infarction in the RIGHT lower lobe. 4. Moderate hiatal hernia.  Patient says she has no previous h/o blood clots but does have a h/o spontaneous abortion.  She works from home and has a sit down job and her life style is very sedentary.  No family h/o Pulmonary emboli. At [facility] she was started on Heparin drip and Milrinone drip.  She is feeling better, currently not on pressors.  Case was d/w IR by Dr Isadora and she was not a good candidate for clot extraction but maybe a better case for catheter directed Thrombolysis despite have continually bleeding for several years, She says she has been having uterine bleeding since 2013 when she had a miscarriage.  Her uterine bleeding apparently from Fibroids has worsened over the last several months and she was being  considered for Hysterectomy at [facility] but has not made it there yet. No N/V/D, no Fever/chills, no recent flights or leg trauma.  She did c/o left calf pain over the last few days and increase size of left ankle. Denies LOC. Pertinent  Medical History  ADD (attention deficit disorder), Arthritis (04/24/2008), Diffuse cystic mastopathy (04/24/2010), Hypertension, Hypothyroidism, and Mastitis.   Significant Hospital Events: Including procedures, antibiotic start and stop dates in addition to other pertinent events   6/28: transfer from [place].  Underwent [facility] ECMO and catheter directed thrombolysis 6/29 remain on [facility] ECMO, lysis catheter removed by IR.  Continue to have vaginal bleeding/clots 6/30 ECMO flow was decreased to 3 L, patient tolerated well, bedside echocardiogram showed mildly reduced RV function compared to prior.  Continued to have vaginal bleeding/clots  Interim History / Subjective:  No overnight issues ECMO flow was reduced to 2.8 L since yesterday, tolerating well Plan for ECMO decannulation later today  Remain on milrinone at 0.125  Objective    Blood pressure (!) 105/59, pulse 82, temperature 98.4 F (36.9 C), resp. rate (!) 29, weight 108.3 kg, SpO2 100%. PAP: (30-38)/(13-24) 38/18 CVP:  [4 mmHg-11 mmHg] 4 mmHg  Vent Mode: PRVC FiO2 (%):  [40 %-50 %] 50 % Set Rate:  [16 bmp-18 bmp] 16 bmp Vt Set:  [490 mL] 490 mL PEEP:  [10 cmH20] 10 cmH20 Plateau Pressure:  [20 cmH20-21 cmH20] 20 cmH20   Intake/Output Summary (Last 24 hours) at 10/24/2023 0941 Last data filed at 10/24/2023 0840 Gross per 24 hour  Intake 2482.91 ml  Output 3875 ml  Net -1392.09  ml   Filed Weights   10/22/23 0500 10/23/23 0600 10/24/23 0500  Weight: 105.9 kg 109.9 kg 108.3 kg    Examination: General: Crtitically ill-appearing obese female, orally intubated HEENT: [place]/AT, eyes anicteric.  ETT and cortrak in place Neuro: Sedated, not following commands.  Eyes are closed.  Pupils 3 mm bilateral  reactive to light Chest: Coarse breath sounds, no wheezes or rhonchi Heart: Regular rate and rhythm, no murmurs or gallops Abdomen: Soft, nondistended, bowel sounds present Extremities: Bilateral ECMO cannula noted  Labs and images reviewed  Patient Lines/Drains/Airways Status     Active Line/Drains/Airways     Name Placement date Placement time Site Days   Arterial Line 10/20/23 Right Radial 10/20/23  1824  Radial  3   CVC Triple Lumen 10/20/23 Right Internal jugular 10/20/23  0600  -- 3   Peripheral IV (Ped) 10/19/23 20 G Hand 10/19/23  1637  -- 4   ECMO Drainage Single Lumen Cannula 10/20/23  1816  -- 3   ECMO Return Single Lumen Cannula 10/20/23  1816  -- 3   NG/OG Vented/Dual Lumen 14 Fr. Oral Marking at nare/corner of mouth 62 cm 10/21/23  0130  Oral  2   Urethral Catheter Lauraine Letters, RN Temperature probe 14 Fr. 10/19/23  2230  Temperature probe  4   Airway 7.5 mm 10/20/23  1804  -- 3   Pulmonary Artery Catheter Left --  --  -- --        Resolved problem list  Lactic acidosis SIRS response from ECMO cannulation, improved Acute kidney injury due to cardiorenal syndrome, improving Hypokalemia/hypophosphatemia  Assessment and Plan  Acute bilateral submassive PE with right heart strain status post catheter directed thrombolysis Acute RV failure with cardiogenic shock status post VA ECMO on 6/28 Acute respiratory failure with hypoxia Right upper lobe lung infarction Vaginal bleeding status post uterine artery embolization, continue to bleed Acute blood loss anemia from ECMO cannulation and vaginal bleeding Hypothyroidism Obesity  Patient remain on VA ECMO, ECMO flow was decreased to 2.8 L and sweep of 2 Patient tolerated ECMO clamp trial yesterday Right ventricular function is improving Continue milrinone at 0.125 Continue anticoagulation with Bival with PTT goal 50-70, currently at goal We will get Doppler ultrasound of lower extremities to rule out DVT Continue  Lasix infusion at 4 mg/h Continue to have high NG tube output about 1.6 L in last 24 hours FiO2 was titrated down to 50%, PEEP is at 8 Continue lung protective ventilation Continue broad-spectrum antibiotics with vancomycin and meropenem Appreciate OB/GYN consult, continue Magace Monitor H&H and transfuse if less than 8 Continue levothyroxine Diet and exercise counseling when appropriate   Best Practice (right click and Reselect all SmartList Selections daily)   Diet/type: NPO w/ oral meds  DVT prophylaxis Bival Pressure ulcer(s): Deferred to nursing notes GI prophylaxis: H2 blocker Lines: Central line, arterial line and ECMO cannulation, still needed Foley: Yes, still needed Code Status:  full code. Last date of goals of care discussion: 6/28 patient and her mother were updated at bedside, decision was to continue full scope of care   Labs   CBC: Recent Labs  Lab 10/21/23 1735 10/21/23 1813 10/22/23 0410 10/22/23 0413 10/23/23 0358 10/23/23 0405 10/23/23 1114 10/23/23 1115 10/23/23 2330 10/24/23 0039 10/24/23 0440 10/24/23 0442 10/24/23 0742  WBC 9.4  --  10.3  --  17.8*  --  20.5*  --   --   --  19.8*  --   --  HGB 8.4*   < > 8.4*   < > 8.0*   < > 8.2*   < > 8.6* 8.2* 8.3* 7.8* 8.2*  HCT 26.6*   < > 27.5*   < > 25.9*   < > 26.4*   < > 27.7* 24.0* 26.9* 23.0* 24.0*  MCV 83.1  --  84.4  --  86.9  --  86.6  --   --   --  87.1  --   --   PLT 192  --  178  --  146*  --  149*  --   --   --  151  --   --    < > = values in this interval not displayed.    Basic Metabolic Panel: Recent Labs  Lab 10/21/23 1215 10/21/23 1216 10/21/23 1548 10/21/23 1601 10/22/23 0410 10/22/23 0413 10/22/23 1618 10/22/23 1630 10/23/23 0358 10/23/23 0405 10/23/23 1552 10/23/23 1600 10/23/23 1951 10/23/23 2330 10/24/23 0039 10/24/23 0440 10/24/23 0442 10/24/23 0742  NA  --    < >  --    < > 147*   < > 144   < > 145   < > 144   < > 149*  --  148* 147* 149* 148*  K  --    < >   --    < > 3.7   < > 4.2   < > 3.7   < > 3.7   < > 3.7  --  3.8 3.7 3.7 3.8  CL  --   --   --    < > 112*  --  108  --  109  --  107  --   --   --   --  109  --   --   CO2  --   --   --    < > 25  --  25  --  32  --  26  --   --   --   --  27  --   --   GLUCOSE  --   --   --    < > 116*  --  96  --  160*  --  121*  --   --   --   --  131*  --   --   BUN  --   --   --    < > 9  --  11  --  16  --  15  --   --   --   --  15  --   --   CREATININE  --   --   --    < > 0.92  --  0.90  --  0.87  --  0.92  --   --   --   --  0.84  --   --   CALCIUM  --   --   --    < > 7.5*  --  8.0*  --  8.3*  --  8.8*  --   --   --   --  9.0  --   --   MG 2.8*  --  2.7*  --  2.1  --  2.1  --  2.1  --   --   --   --   --   --  2.4  --   --   PHOS 3.6  --  3.3  --  2.3*  --  4.4  --   --   --   --   --   --  3.3  --   --   --   --    < > = values in this interval not displayed.   GFR: Estimated Creatinine Clearance: 103.8 mL/min (by C-G formula based on SCr of 0.84 mg/dL). Recent Labs  Lab 10/19/23 1559 10/19/23 1639 10/22/23 0410 10/22/23 0413 10/22/23 1109 10/23/23 0358 10/23/23 0524 10/23/23 0950 10/23/23 1114 10/24/23 0440 10/24/23 0631  PROCALCITON <0.10  --   --   --   --   --   --   --   --   --   --   WBC 15.2*   < > 10.3  --   --  17.8*  --   --  20.5* 19.8*  --   LATICACIDVEN  --    < >  --    < > 0.9  --  1.2 1.9  --   --  1.0   < > = values in this interval not displayed.    Liver Function Tests: Recent Labs  Lab 10/19/23 1559 10/20/23 1525 10/20/23 2344  AST 28  --  18  ALT 12  --  9  ALKPHOS 22*  --  14*  BILITOT 0.4  --  1.2  PROT 7.3  --  5.1*  ALBUMIN 3.3* 3.1* 2.8*   No results for input(s): LIPASE, AMYLASE in the last 168 hours. No results for input(s): AMMONIA in the last 168 hours.  ABG    Component Value Date/Time   PHART 7.499 (H) 10/24/2023 0742   PCO2ART 39.3 10/24/2023 0742   PO2ART 93 10/24/2023 0742   HCO3 30.6 (H) 10/24/2023 0742   TCO2 32 10/24/2023  0742   ACIDBASEDEF 2.0 10/21/2023 1813   O2SAT 98 10/24/2023 0742     Coagulation Profile: Recent Labs  Lab 10/19/23 1710 10/20/23 2344  INR 1.0 1.3*    Cardiac Enzymes: No results for input(s): CKTOTAL, CKMB, CKMBINDEX, TROPONINI in the last 168 hours.  HbA1C: Hgb A1c MFr Bld  Date/Time Value Ref Range Status  10/20/2023 06:43 AM 5.0 4.8 - 5.6 % Final    Comment:    (NOTE) Diagnosis of Diabetes The following HbA1c ranges recommended by the American Diabetes Association (ADA) may be used as an aid in the diagnosis of diabetes mellitus.  Hemoglobin             Suggested A1C NGSP%              Diagnosis  <5.7                   Non Diabetic  5.7-6.4                Pre-Diabetic  >6.4                   Diabetic  <7.0                   Glycemic control for                       adults with diabetes.      CBG: Recent Labs  Lab 10/23/23 1558 10/23/23 1949 10/23/23 2328 10/24/23 0439 10/24/23 0740  GLUCAP 114* 108* 121* 122* 122*    The patient is critically ill due to acute bilateral submassive PE/acute respiratory failure/cardiogenic shock status post VA ECMO.  Critical care  was necessary to treat or prevent imminent or life-threatening deterioration.  Critical care was time spent personally by me on the following activities: development of treatment plan with patient and/or surrogate as well as nursing, discussions with consultants, evaluation of patient's response to treatment, examination of patient, obtaining history from patient or surrogate, ordering and performing treatments and interventions, ordering and review of laboratory studies, ordering and review of radiographic studies, pulse oximetry, re-evaluation of patient's condition and participation in multidisciplinary rounds.   During this encounter critical care time was devoted to patient care services described in this note for 37 minutes.     Valinda Novas, MD [place] See Amion for pager If no response to pager, please call [phone] until 7pm After 7pm, Please call E-link [phone]

## 2023-10-25 ENCOUNTER — Inpatient Hospital Stay (HOSPITAL_COMMUNITY): Payer: MEDICAID

## 2023-10-25 LAB — HEMOGLOBIN AND HEMATOCRIT, BLOOD
HCT: 27.9 % — ABNORMAL LOW (ref 36.0–46.0)
HCT: 28.5 % — ABNORMAL LOW (ref 36.0–46.0)
HCT: 28.1 % — ABNORMAL LOW (ref 36.0–46.0)
Hemoglobin: 8.7 g/dL — ABNORMAL LOW (ref 12.0–15.0)
Hemoglobin: 8.8 g/dL — ABNORMAL LOW (ref 12.0–15.0)
Hemoglobin: 8.7 g/dL — ABNORMAL LOW (ref 12.0–15.0)

## 2023-10-25 LAB — POCT I-STAT 7, (LYTES, BLD GAS, ICA,H+H)
Acid-Base Excess: 6 mmol/L — ABNORMAL HIGH (ref 0.0–2.0)
Acid-Base Excess: 5 mmol/L — ABNORMAL HIGH (ref 0.0–2.0)
Acid-Base Excess: 4 mmol/L — ABNORMAL HIGH (ref 0.0–2.0)
Acid-Base Excess: 5 mmol/L — ABNORMAL HIGH (ref 0.0–2.0)
Acid-Base Excess: 6 mmol/L — ABNORMAL HIGH (ref 0.0–2.0)
Acid-Base Excess: 8 mmol/L — ABNORMAL HIGH (ref 0.0–2.0)
Bicarbonate: 30.6 mmol/L — ABNORMAL HIGH (ref 20.0–28.0)
Bicarbonate: 30.7 mmol/L — ABNORMAL HIGH (ref 20.0–28.0)
Bicarbonate: 29.1 mmol/L — ABNORMAL HIGH (ref 20.0–28.0)
Bicarbonate: 30.1 mmol/L — ABNORMAL HIGH (ref 20.0–28.0)
Bicarbonate: 29.9 mmol/L — ABNORMAL HIGH (ref 20.0–28.0)
Bicarbonate: 32.3 mmol/L — ABNORMAL HIGH (ref 20.0–28.0)
Calcium, Ion: 1.25 mmol/L (ref 1.15–1.40)
Calcium, Ion: 1.27 mmol/L (ref 1.15–1.40)
Calcium, Ion: 1.26 mmol/L (ref 1.15–1.40)
Calcium, Ion: 1.23 mmol/L (ref 1.15–1.40)
Calcium, Ion: 1.23 mmol/L (ref 1.15–1.40)
Calcium, Ion: 1.25 mmol/L (ref 1.15–1.40)
HCT: 24 % — ABNORMAL LOW (ref 36.0–46.0)
HCT: 23 % — ABNORMAL LOW (ref 36.0–46.0)
HCT: 24 % — ABNORMAL LOW (ref 36.0–46.0)
HCT: 24 % — ABNORMAL LOW (ref 36.0–46.0)
HCT: 23 % — ABNORMAL LOW (ref 36.0–46.0)
HCT: 25 % — ABNORMAL LOW (ref 36.0–46.0)
Hemoglobin: 8.2 g/dL — ABNORMAL LOW (ref 12.0–15.0)
Hemoglobin: 7.8 g/dL — ABNORMAL LOW (ref 12.0–15.0)
Hemoglobin: 8.2 g/dL — ABNORMAL LOW (ref 12.0–15.0)
Hemoglobin: 8.2 g/dL — ABNORMAL LOW (ref 12.0–15.0)
Hemoglobin: 7.8 g/dL — ABNORMAL LOW (ref 12.0–15.0)
Hemoglobin: 8.5 g/dL — ABNORMAL LOW (ref 12.0–15.0)
O2 Saturation: 98 %
O2 Saturation: 98 %
O2 Saturation: 97 %
O2 Saturation: 99 %
O2 Saturation: 97 %
O2 Saturation: 93 %
Patient temperature: 36.5
Patient temperature: 36.8
Patient temperature: 36.6
Patient temperature: 36.7
Patient temperature: 36.4
Patient temperature: 36.5
Potassium: 3.5 mmol/L (ref 3.5–5.1)
Potassium: 3.6 mmol/L (ref 3.5–5.1)
Potassium: 3.7 mmol/L (ref 3.5–5.1)
Potassium: 3.5 mmol/L (ref 3.5–5.1)
Potassium: 3.7 mmol/L (ref 3.5–5.1)
Potassium: 3.2 mmol/L — ABNORMAL LOW (ref 3.5–5.1)
Sodium: 149 mmol/L — ABNORMAL HIGH (ref 135–145)
Sodium: 149 mmol/L — ABNORMAL HIGH (ref 135–145)
Sodium: 149 mmol/L — ABNORMAL HIGH (ref 135–145)
Sodium: 148 mmol/L — ABNORMAL HIGH (ref 135–145)
Sodium: 149 mmol/L — ABNORMAL HIGH (ref 135–145)
Sodium: 149 mmol/L — ABNORMAL HIGH (ref 135–145)
TCO2: 32 mmol/L (ref 22–32)
TCO2: 32 mmol/L (ref 22–32)
TCO2: 30 mmol/L (ref 22–32)
TCO2: 31 mmol/L (ref 22–32)
TCO2: 31 mmol/L (ref 22–32)
TCO2: 34 mmol/L — ABNORMAL HIGH (ref 22–32)
pCO2 arterial: 43.7 mmHg (ref 32–48)
pCO2 arterial: 47.8 mmHg (ref 32–48)
pCO2 arterial: 45.4 mmHg (ref 32–48)
pCO2 arterial: 45.1 mmHg (ref 32–48)
pCO2 arterial: 40.9 mmHg (ref 32–48)
pCO2 arterial: 43.5 mmHg (ref 32–48)
pH, Arterial: 7.451 — ABNORMAL HIGH (ref 7.35–7.45)
pH, Arterial: 7.415 (ref 7.35–7.45)
pH, Arterial: 7.413 (ref 7.35–7.45)
pH, Arterial: 7.431 (ref 7.35–7.45)
pH, Arterial: 7.47 — ABNORMAL HIGH (ref 7.35–7.45)
pH, Arterial: 7.477 — ABNORMAL HIGH (ref 7.35–7.45)
pO2, Arterial: 96 mmHg (ref 83–108)
pO2, Arterial: 99 mmHg (ref 83–108)
pO2, Arterial: 88 mmHg (ref 83–108)
pO2, Arterial: 139 mmHg — ABNORMAL HIGH (ref 83–108)
pO2, Arterial: 84 mmHg (ref 83–108)
pO2, Arterial: 61 mmHg — ABNORMAL LOW (ref 83–108)

## 2023-10-25 LAB — HEPARIN LEVEL (UNFRACTIONATED)
Heparin Unfractionated: 0.18 [IU]/mL — ABNORMAL LOW (ref 0.30–0.70)
Heparin Unfractionated: 0.28 [IU]/mL — ABNORMAL LOW (ref 0.30–0.70)
Heparin Unfractionated: 0.37 [IU]/mL (ref 0.30–0.70)
Heparin Unfractionated: 0.44 [IU]/mL (ref 0.30–0.70)

## 2023-10-25 LAB — CBC
HCT: 28.8 % — ABNORMAL LOW (ref 36.0–46.0)
Hemoglobin: 8.8 g/dL — ABNORMAL LOW (ref 12.0–15.0)
MCH: 27.2 pg (ref 26.0–34.0)
MCHC: 30.6 g/dL (ref 30.0–36.0)
MCV: 89.2 fL (ref 80.0–100.0)
Platelets: 157 10*3/uL (ref 150–400)
RBC: 3.23 MIL/uL — ABNORMAL LOW (ref 3.87–5.11)
RDW: 18.5 % — ABNORMAL HIGH (ref 11.5–15.5)
WBC: 19.1 10*3/uL — ABNORMAL HIGH (ref 4.0–10.5)
nRBC: 0.2 % (ref 0.0–0.2)

## 2023-10-25 LAB — BASIC METABOLIC PANEL WITH GFR
Anion gap: 10 (ref 5–15)
Anion gap: 9 (ref 5–15)
BUN: 20 mg/dL (ref 6–20)
BUN: 18 mg/dL (ref 6–20)
CO2: 28 mmol/L (ref 22–32)
CO2: 30 mmol/L (ref 22–32)
Calcium: 8.9 mg/dL (ref 8.9–10.3)
Calcium: 8.9 mg/dL (ref 8.9–10.3)
Chloride: 109 mmol/L (ref 98–111)
Chloride: 110 mmol/L (ref 98–111)
Creatinine, Ser: 1.09 mg/dL — ABNORMAL HIGH (ref 0.44–1.00)
Creatinine, Ser: 0.88 mg/dL (ref 0.44–1.00)
GFR, Estimated: >60 mL/min (ref >60)
GFR, Estimated: >60 mL/min (ref >60)
Glucose, Bld: 117 mg/dL — ABNORMAL HIGH (ref 70–99)
Glucose, Bld: 138 mg/dL — ABNORMAL HIGH (ref 70–99)
Potassium: 3.8 mmol/L (ref 3.5–5.1)
Potassium: 3.3 mmol/L — ABNORMAL LOW (ref 3.5–5.1)
Sodium: 147 mmol/L — ABNORMAL HIGH (ref 135–145)
Sodium: 149 mmol/L — ABNORMAL HIGH (ref 135–145)

## 2023-10-25 LAB — COOXEMETRY PANEL
Carboxyhemoglobin: 2 % — ABNORMAL HIGH (ref 0.5–1.5)
Carboxyhemoglobin: 1.9 % — ABNORMAL HIGH (ref 0.5–1.5)
Methemoglobin: 0.7 % (ref 0.0–1.5)
Methemoglobin: 0.7 % (ref 0.0–1.5)
O2 Saturation: 70.4 %
O2 Saturation: 60.2 %
Total hemoglobin: 9 g/dL — ABNORMAL LOW (ref 12.0–16.0)
Total hemoglobin: 9.3 g/dL — ABNORMAL LOW (ref 12.0–16.0)

## 2023-10-25 LAB — GLUCOSE, CAPILLARY
Glucose-Capillary: 118 mg/dL — ABNORMAL HIGH (ref 70–99)
Glucose-Capillary: 111 mg/dL — ABNORMAL HIGH (ref 70–99)
Glucose-Capillary: 139 mg/dL — ABNORMAL HIGH (ref 70–99)
Glucose-Capillary: 144 mg/dL — ABNORMAL HIGH (ref 70–99)
Glucose-Capillary: 142 mg/dL — ABNORMAL HIGH (ref 70–99)
Glucose-Capillary: 121 mg/dL — ABNORMAL HIGH (ref 70–99)

## 2023-10-25 LAB — MAGNESIUM
Magnesium: 2.5 mg/dL — ABNORMAL HIGH (ref 1.7–2.4)
Magnesium: 2.4 mg/dL (ref 1.7–2.4)

## 2023-10-25 LAB — CULTURE, BAL-QUANTITATIVE W GRAM STAIN: Culture: NO GROWTH

## 2023-10-25 LAB — PHOSPHORUS: Phosphorus: 5.2 mg/dL — ABNORMAL HIGH (ref 2.5–4.6)

## 2023-10-25 LAB — LACTATE DEHYDROGENASE: LDH: 504 U/L — ABNORMAL HIGH (ref 98–192)

## 2023-10-25 MED ORDER — FREE WATER
50.0000 mL | Status: DC
  Administered 2023-10-25 – 2023-10-26 (×7): 50 mL

## 2023-10-25 MED ORDER — POTASSIUM CHLORIDE 10 MEQ/50ML IV SOLN
10.0000 meq | INTRAVENOUS | Status: AC
  Administered 2023-10-25 (×4): 10 meq via INTRAVENOUS
  Filled 2023-10-25 (×4): qty 50

## 2023-10-25 MED ORDER — VITAL 1.5 CAL PO LIQD
1000.0000 mL | ORAL | Status: DC
  Administered 2023-10-25: 1000 mL

## 2023-10-25 MED ORDER — MEGESTROL ACETATE 40 MG PO TABS
40.0000 mg | ORAL_TABLET | Freq: Every day | ORAL | Status: DC
  Filled 2023-10-25: qty 1

## 2023-10-25 MED ORDER — SORBITOL 70 % SOLN
30.0000 mL | Freq: Once | Status: AC
  Administered 2023-10-25: 30 mL
  Filled 2023-10-25: qty 30

## 2023-10-25 MED ORDER — NOREPINEPHRINE 4 MG/250ML-% IV SOLN
0.0000 ug/min | INTRAVENOUS | Status: DC
  Administered 2023-10-25 (×2): 2 ug/min via INTRAVENOUS

## 2023-10-25 MED ORDER — MEGESTROL ACETATE 40 MG PO TABS
40.0000 mg | ORAL_TABLET | Freq: Two times a day (BID) | ORAL | Status: DC
  Administered 2023-10-25 – 2023-10-28 (×6): 40 mg
  Filled 2023-10-25 (×7): qty 1

## 2023-10-25 MED ORDER — POTASSIUM CHLORIDE 20 MEQ PO PACK
20.0000 meq | PACK | ORAL | Status: AC
  Administered 2023-10-25 (×2): 20 meq
  Filled 2023-10-25 (×2): qty 1

## 2023-10-25 MED ORDER — PROSOURCE TF20 ENFIT COMPATIBL EN LIQD
60.0000 mL | Freq: Every day | ENTERAL | Status: DC
  Administered 2023-10-25 – 2023-10-27 (×3): 60 mL
  Filled 2023-10-25 (×3): qty 60

## 2023-10-25 MED ORDER — DEXMEDETOMIDINE HCL IN NACL 400 MCG/100ML IV SOLN
0.0000 ug/kg/h | INTRAVENOUS | Status: DC
  Administered 2023-10-25 – 2023-10-26 (×3): 0.4 ug/kg/h via INTRAVENOUS
  Administered 2023-10-27 (×2): 0.6 ug/kg/h via INTRAVENOUS
  Administered 2023-10-27: 0.5 ug/kg/h via INTRAVENOUS
  Administered 2023-10-27: 0.6 ug/kg/h via INTRAVENOUS
  Administered 2023-10-28: 0.4 ug/kg/h via INTRAVENOUS
  Filled 2023-10-25 (×4): qty 100
  Filled 2023-10-25: qty 200
  Filled 2023-10-25 (×2): qty 100

## 2023-10-25 MED ORDER — SMOG ENEMA
960.0000 mL | Freq: Once | RECTAL | Status: AC
  Administered 2023-10-25: 960 mL via RECTAL
  Filled 2023-10-25: qty 960

## 2023-10-25 NOTE — Progress Notes (Addendum)
 Patient ID: [name], female   DOB: 06/11/1975, 48 y.o.   MRN: [id number]     Advanced Heart Failure Rounding Note  Cardiologist: None  Chief Complaint: PE Subjective:    VA ECMO decannulated 7/2.   No pressors, stable MAP.   I/Os -638, on Lasix 4 mg/hr. CXR stable.  FiO2 higher today at 0.7.   Still with some vaginal oozing, hgb 8.8 today.    Remains on empiric vancomycin/meropenem.   LE dopplers showed left-sided DVT.  She is now on heparin gtt.   Swan: PA 43/24 RA 10 Milrinone 0.125  Objective:   Weight Range: 110.8 kg Body mass index is 38.26 kg/m.   Vital Signs:   Temp:  [95.9 F (35.5 C)-99.1 F (37.3 C)] 97.9 F (36.6 C) (07/03 0600) Pulse Rate:  [75-108] 79 (07/03 0630) Resp:  [14-29] 20 (07/03 0600) BP: (101-130)/(56-66) 101/56 (07/03 0335) SpO2:  [86 %-100 %] 98 % (07/03 0630) Arterial Line BP: (86-139)/(49-70) 92/49 (07/03 0630) FiO2 (%):  [50 %-70 %] 70 % (07/03 0433) Weight:  [108.3 kg-110.8 kg] 110.8 kg (07/03 0415) Last BM Date : 10/19/23  Weight change: Filed Weights   10/24/23 0500 10/25/23 0300 10/25/23 0415  Weight: 108.3 kg 108.3 kg 110.8 kg    Intake/Output:   Intake/Output Summary (Last 24 hours) at 10/25/2023 0726 Last data filed at 10/25/2023 0610 Gross per 24 hour  Intake 2666.49 ml  Output 3305 ml  Net -638.51 ml      Physical Exam    General: Sedated on vent Neck: internal jugular catheters, no thyromegaly or thyroid nodule.  Lungs: Decreased at bases.  CV: Nondisplaced PMI.  Heart regular S1/S2, no S3/S4, no murmur.  1+ ankle edema.   Abdomen: Soft, nontender, no hepatosplenomegaly, no distention.  Skin: Intact without lesions or rashes.  Neurologic: Sedated on vent.  Extremities: No clubbing or cyanosis.  HEENT: Normal.   Telemetry   NSR (personally reviewed)  Labs    CBC Recent Labs    10/24/23 1610 10/24/23 1616 10/25/23 0453 10/25/23 0601  WBC 20.8*  --  19.1*  --   HGB 8.7*   < > 8.8* 8.2*  HCT  28.5*   < > 28.8* 24.0*  MCV 88.2  --  89.2  --   PLT 139*  --  157  --    < > = values in this interval not displayed.   Basic Metabolic Panel Recent Labs    93/69/74 1618 10/22/23 1630 10/23/23 2330 10/24/23 0039 10/24/23 1610 10/24/23 1616 10/25/23 0453 10/25/23 0601  NA 144   < >  --    < > 149*   < > 147* 149*  K 4.2   < >  --    < > 3.6   < > 3.8 3.7  CL 108   < >  --    < > 109  --  109  --   CO2 25   < >  --    < > 29  --  28  --   GLUCOSE 96   < >  --    < > 135*  --  117*  --   BUN 11   < >  --    < > 17  --  20  --   CREATININE 0.90   < >  --    < > 0.96  --  1.09*  --   CALCIUM 8.0*   < >  --    < >  8.7*  --  8.9  --   MG 2.1   < >  --    < > 2.4  --  2.5*  --   PHOS 4.4  --  3.3  --   --   --   --   --    < > = values in this interval not displayed.   Liver Function Tests No results for input(s): AST, ALT, ALKPHOS, BILITOT, PROT, ALBUMIN in the last 72 hours.  No results for input(s): LIPASE, AMYLASE in the last 72 hours. Cardiac Enzymes No results for input(s): CKTOTAL, CKMB, CKMBINDEX, TROPONINI in the last 72 hours.  BNP: BNP (last 3 results) Recent Labs    10/20/23 0643  BNP 308.4*    ProBNP (last 3 results) No results for input(s): PROBNP in the last 8760 hours.   D-Dimer No results for input(s): DDIMER in the last 72 hours. Hemoglobin A1C No results for input(s): HGBA1C in the last 72 hours.  Fasting Lipid Panel No results for input(s): CHOL, HDL, LDLCALC, TRIG, CHOLHDL, LDLDIRECT in the last 72 hours. Thyroid Function Tests No results for input(s): TSH, T4TOTAL, T3FREE, THYROIDAB in the last 72 hours.  Invalid input(s): FREET3  Other results:   Imaging    DG Abd 1 View Result Date: 10/24/2023 CLINICAL DATA:  [id number] Encounter for orogastric (OG) tube placement [id number]. EXAM: ABDOMEN - 1 VIEW COMPARISON:  None Available. FINDINGS: The bowel gas pattern is non-obstructive. No evidence  of pneumoperitoneum, within the limitations of a supine film. No acute osseous abnormalities. The soft tissues are within normal limits. Surgical changes, devices, tubes and lines: Enteric tube is seen with its tip and side hole overlying the left upper quadrant, overlying the proximal stomach region. IMPRESSION: Enteric tube is seen with its tip and side hole overlying the left upper quadrant, overlying the proximal stomach region. Electronically Signed   By: Ree Molt M.D.   On: 10/24/2023 15:53   VAS US  LOWER EXTREMITY VENOUS (DVT) Result Date: 10/24/2023  Lower Venous DVT Study Patient Name:  NOAM FRANZEN  Date of Exam:   10/24/2023 Medical Rec #: [id number]      Accession #:    [id number] Date of Birth: 03-23-1976      Patient Gender: F Patient Age:   25 years Exam Location:  [facility] Procedure:      VAS US  LOWER EXTREMITY VENOUS (DVT) Referring Phys: VALINDA CHAND --------------------------------------------------------------------------------  Indications: Pulmonary embolism, Swelling, and right leg. Other Indications: On ECMO for PE. Comparison Study: No priors. Performing Technologist: Ricka Sturdivant-Jones RDMS, RVT  Examination Guidelines: A complete evaluation includes B-mode imaging, spectral Doppler, color Doppler, and power Doppler as needed of all accessible portions of each vessel. Bilateral testing is considered an integral part of a complete examination. Limited examinations for reoccurring indications may be performed as noted. The reflux portion of the exam is performed with the patient in reverse Trendelenburg.  +---------+---------------+---------+-----------+----------+--------------+ RIGHT    CompressibilityPhasicitySpontaneityPropertiesThrombus Aging +---------+---------------+---------+-----------+----------+--------------+ CFV      Full           Yes      Yes                                  +---------+---------------+---------+-----------+----------+--------------+ SFJ      Full                                                        +---------+---------------+---------+-----------+----------+--------------+  FV Prox  Full                                                        +---------+---------------+---------+-----------+----------+--------------+ FV Mid   Full                                                        +---------+---------------+---------+-----------+----------+--------------+ FV DistalFull           Yes      Yes                                 +---------+---------------+---------+-----------+----------+--------------+ PFV      Full                                                        +---------+---------------+---------+-----------+----------+--------------+ POP      Full           Yes      Yes                                 +---------+---------------+---------+-----------+----------+--------------+ PTV      Full                                                        +---------+---------------+---------+-----------+----------+--------------+ PERO     Full                                                        +---------+---------------+---------+-----------+----------+--------------+   +---------+---------------+---------+-----------+----------+--------------+ LEFT     CompressibilityPhasicitySpontaneityPropertiesThrombus Aging +---------+---------------+---------+-----------+----------+--------------+ CFV      Full           Yes      Yes                                 +---------+---------------+---------+-----------+----------+--------------+ SFJ      Full                                                        +---------+---------------+---------+-----------+----------+--------------+ FV Prox  Full                                                         +---------+---------------+---------+-----------+----------+--------------+  FV Mid   Full                                                        +---------+---------------+---------+-----------+----------+--------------+ FV DistalFull           Yes      Yes                                 +---------+---------------+---------+-----------+----------+--------------+ PFV      Full                                                        +---------+---------------+---------+-----------+----------+--------------+ POP      None                                         Acute          +---------+---------------+---------+-----------+----------+--------------+ PTV      Partial                                      Acute          +---------+---------------+---------+-----------+----------+--------------+ PERO     Partial                                      Acute          +---------+---------------+---------+-----------+----------+--------------+     Summary: RIGHT: - There is no evidence of deep vein thrombosis in the lower extremity.  - No cystic structure found in the popliteal fossa.  LEFT: - Findings consistent with acute deep vein thrombosis involving the left popliteal vein, left posterior tibial veins, and left peroneal veins.  - No cystic structure found in the popliteal fossa.  *See table(s) above for measurements and observations. Electronically signed by Gaile New MD on 10/24/2023 at 1:43:51 PM.    Final      Medications:     Scheduled Medications:  Chlorhexidine Gluconate Cloth  6 each Topical Daily   famotidine  20 mg Per Tube BID   feeding supplement (PROSource TF20)  60 mL Per Tube BID   insulin aspart  0-20 Units Subcutaneous Q4H   lactulose  20 g Per Tube TID   levothyroxine  88 mcg Per Tube Q0600   mouth rinse  15 mL Mouth Rinse Q2H   polyethylene glycol  17 g Per Tube BID   senna-docusate  2 tablet Per Tube Daily   sodium chloride  flush  3 mL  Intravenous Q12H   sorbitol  30 mL Per Tube Once   thiamine  100 mg Per Tube Daily    Infusions:  albumin human Stopped (10/22/23 1227)   feeding supplement (VITAL 1.5 CAL) Stopped (10/23/23 0430)   furosemide (LASIX) 200 mg in dextrose 5 % 100 mL (2 mg/mL) infusion 4 mg/hr (10/25/23 0600)   heparin 1,800 Units/hr (10/25/23  0600)   HYDROmorphone 5.5 mg/hr (10/25/23 0600)   midazolam 10 mg/hr (10/25/23 0600)   milrinone 0.125 mcg/kg/min (10/25/23 0600)   norepinephrine (LEVOPHED) Adult infusion 2 mcg/min (10/25/23 9356)   potassium chloride       PRN Medications: albumin human, albuterol, docusate sodium, HYDROmorphone, iohexol, midazolam, ondansetron (ZOFRAN) IV, mouth rinse, mouth rinse, polyethylene glycol, sodium chloride  flush    Assessment/Plan   1. 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 CT chest with on 10/19/23 with bilateral pulmonary emboli with severe clot burden; clot is in the segmental and distal branches, not amenable to thrombectomy.  Cannulated for V-A ECMO fem-fem on 10/20/23 for RV failure. RV on 6/28 with LV EF 65-70%, D septum, RV function moderately reduced.  Most recent echo 7/1 with LV EF normal, RV mildly dysfunctional and mildly dilated, IVC small. VA ECMO decannulated 7/2.  BP stable off pressors.  PA pressure mildly higher at 43/24 with CVP 10 today, good co-ox 70%.  - Will work on getting more fluid off today, increase Lasix to 6 mg/hr. Replace K.  - Continue milrinone 0.125.  - Could consider NO use but think we may get most effect from more aggressive diuresis.  2. Acute PE: With RV failure. Bilateral, received TPA to each PA by IR. Has left leg DVT.  - Continue heparin gtt.  3. Uterine fibroids with bleeding: Now s/p uterine artery embolization 10/20/23. Still with oozing and clots. Hgb 8.8 today.  - Follow CBC, transfuse hgb < 8.   4. Acute hypoxemic respiratory failure:  FiO2 higher today at 0.7.  - More aggressive diuresis as above.   -  Vent per CCM. - Empiric vancomycin/meropenem, planned to stop today post-decannulation if CCM agrees.    CRITICAL CARE Performed by: Ezra Shuck  Total critical care time: 40 minutes  Critical care time was exclusive of separately billable procedures and treating other patients.  Critical care was necessary to treat or prevent imminent or life-threatening deterioration.  Critical care was time spent personally by me on the following activities: development of treatment plan with patient and/or surrogate as well as nursing, discussions with consultants, evaluation of patient's response to treatment, examination of patient, obtaining history from patient or surrogate, ordering and performing treatments and interventions, ordering and review of laboratory studies, ordering and review of radiographic studies, pulse oximetry and re-evaluation of patient's condition.   Length of Stay: 5  Ezra Shuck, MD  10/25/2023, 7:26 AM  Advanced Heart Failure Team Pager [phone] (M-F; 7a - 5p)  Please contact [facility] for night-coverage after hours (5p -7a ) and weekends on [email or web address]

## 2023-10-25 NOTE — Progress Notes (Signed)
 NAME:  Anita Aguirre, MRN:  [id number], DOB:  08-12-1975, LOS: 5 ADMISSION DATE:  10/20/2023, CONSULTATION DATE:  10/20/2023 REFERRING MD:  Dr Isadora, CHIEF COMPLAINT:  SOB/Chest pain  History of Present Illness:  48 y/o female with PMH for ADD (attention deficit disorder), Arthritis (04/24/2008), Diffuse cystic mastopathy (04/24/2010), Hypertension, Hypothyroidism,Mastitis and bleeding uterine fibroids who presented to [facility] with worsening SOB and chest pain for last 2 days worsening day of admission to hospital  Chest pain dull and not radiating.  She was found to have submassive Pulmonary embolism with right heart strain.  Her Troponin increased to 409, Lactic Acid down to 2.7 from 3.9. CT scan chest revealed, Bilateral pulmonary emboli with severe clot burden. 2. CT evidence of RIGHT ventricular strain. Positive for acute PE with CT evidence of right heart strain (RV/LV Ratio = 1.4) consistent with at least submassive (intermediate risk) PE. The presence of right heart strain has been associated with an increased risk of morbidity and mortality. Please refer to the Code PE Focused order set in EPIC. 3. Small pulmonary infarction in the RIGHT lower lobe. 4. Moderate hiatal hernia.  Patient says she has no previous h/o blood clots but does have a h/o spontaneous abortion.  She works from home and has a sit down job and her life style is very sedentary.  No family h/o Pulmonary emboli. At [facility] she was started on Heparin drip and Milrinone drip.  She is feeling better, currently not on pressors.  Case was d/w IR by Dr Isadora and she was not a good candidate for clot extraction but maybe a better case for catheter directed Thrombolysis despite have continually bleeding for several years, She says she has been having uterine bleeding since 2013 when she had a miscarriage.  Her uterine bleeding apparently from Fibroids has worsened over the last several months and she was being  considered for Hysterectomy at [facility] but has not made it there yet. No N/V/D, no Fever/chills, no recent flights or leg trauma.  She did c/o left calf pain over the last few days and increase size of left ankle. Denies LOC. Pertinent  Medical History  ADD (attention deficit disorder), Arthritis (04/24/2008), Diffuse cystic mastopathy (04/24/2010), Hypertension, Hypothyroidism, and Mastitis.   Significant Hospital Events: Including procedures, antibiotic start and stop dates in addition to other pertinent events   6/28: transfer from [place].  Underwent VA ECMO and catheter directed thrombolysis 6/29 remain on VA ECMO, lysis catheter removed by IR.  Continue to have vaginal bleeding/clots 6/30 ECMO flow was decreased to 3 L, patient tolerated well, bedside echocardiogram showed mildly reduced RV function compared to prior.  Continued to have vaginal bleeding/clots 7/2 ECMO decannulated  Interim History / Subjective:  Patient underwent ECMO decannulation yesterday, tolerated well She became hypoxic overnight, FiO2 increased to 70% and PEEP to 12 Remain on milrinone at 0.125 Started on low-dose Levophed on 2 mics currently  Objective    Blood pressure (!) 101/56, pulse 81, temperature 98.1 F (36.7 C), resp. rate 20, height 5' 7 (1.702 m), weight 110.8 kg, SpO2 97%. PAP: (30-71)/(17-51) 41/24 CVP:  [5 mmHg-17 mmHg] 10 mmHg CO:  [6.6 L/min-7.7 L/min] 6.6 L/min CI:  [3.32 L/min/m2-3.9 L/min/m2] 3.32 L/min/m2  Vent Mode: PRVC FiO2 (%):  [50 %-70 %] 50 % Set Rate:  [16 bmp-20 bmp] 20 bmp Vt Set:  [490 mL] 490 mL PEEP:  [10 cmH20-12 cmH20] 10 cmH20 Plateau Pressure:  [20 cmH20-25 cmH20] 25 cmH20   Intake/Output  Summary (Last 24 hours) at 10/25/2023 0824 Last data filed at 10/25/2023 0800 Gross per 24 hour  Intake 2779.21 ml  Output 3480 ml  Net -700.79 ml   Filed Weights   10/24/23 0500 10/25/23 0300 10/25/23 0415  Weight: 108.3 kg 108.3 kg 110.8 kg    Examination: General:  Crtitically ill-appearing obese female, orally intubated HEENT: [place]/AT, eyes anicteric.  ETT and cortrak in place Neuro: Sedated, not following commands.  Eyes are closed.  Pupils 3 mm bilateral reactive to light Chest: Reduced air entry at the bases bilaterally, no wheezes or rhonchi Heart: Regular rate and rhythm, no murmurs or gallops Abdomen: Soft, nondistended, bowel sounds present   Labs and images reviewed  Patient Lines/Drains/Airways Status     Active Line/Drains/Airways     Name Placement date Placement time Site Days   Arterial Line 10/20/23 Right Radial 10/20/23  1824  Radial  5   CVC Triple Lumen 10/20/23 Right Internal jugular 10/20/23  0600  -- 5   Peripheral IV (Ped) 10/19/23 20 G Hand 10/19/23  1637  -- 6   Negative Pressure Wound Therapy Groin Left;Anterior 10/24/23  1506  --  1   NG/OG Vented/Dual Lumen Oral External length of tube 65 cm 10/24/23  1400  Oral  1   Urethral Catheter Lauraine Letters, RN Temperature probe 14 Fr. 10/19/23  2230  Temperature probe  6   Airway 7.5 mm 10/20/23  1804  -- 5   Pulmonary Artery Catheter Left --  --  -- --   Wound 10/24/23 1459 Surgical Closed Surgical Incision Groin Left 10/24/23  1459  Groin  1         Resolved problem list  Lactic acidosis SIRS response from ECMO cannulation, improved Acute kidney injury due to cardiorenal syndrome, improving Hypokalemia/hypophosphatemia  Assessment and Plan  Acute bilateral submassive PE with right heart strain status post catheter directed thrombolysis Acute RV failure with cardiogenic shock status post VA ECMO on 6/28 -7/2 Acute respiratory failure with hypoxia Right upper lobe lung infarction Vaginal bleeding status post uterine artery embolization, continue to bleed Acute blood loss anemia from ECMO cannulation and vaginal bleeding Hypothyroidism Obesity Constipation Intolerant of tube feeds Hypernatremia  VA ECMO was decannulated yesterday, patient tolerated well Bival was  switched to heparin infusion  Remain on milrinone and low-dose Levophed Increase lasix infusion to 6mg /h FiO2 requirement increased overnight to 70% and PEEP of 12 Now ABG looks better, will continue to trend FiO2 and PEEP with PaO2 goal 60-80 Continue to have some vaginal bleeding Continue Magace per OB/GYN H&H is stable, closely monitor Continue levothyroxine Diet and exercise counseling as appropriate Patient did not have bowel movement since admission Will start lactulose 3 times daily per tube Will give her sorbitol enema Cortrak will replace today, will start trickle tube feeds Started on free water flushes, serum sodium is at 147 Antibiotics were stopped   Best Practice (right click and Reselect all SmartList Selections daily)   Diet/type: NPO w/ oral meds start trickle tube DVT prophylaxis Bival Pressure ulcer(s): Deferred to nursing notes GI prophylaxis: H2 blocker Lines: Central line, arterial line, still needed Foley: Yes, still needed Code Status:  full code. Last date of goals of care discussion: 6/28 patient and her mother were updated at bedside, decision was to continue full scope of care   Labs   CBC: Recent Labs  Lab 10/23/23 0358 10/23/23 0405 10/23/23 1114 10/23/23 1115 10/24/23 0440 10/24/23 0442 10/24/23 1610 10/24/23 1616 10/24/23 2300  10/25/23 0453 10/25/23 0601  WBC 17.8*  --  20.5*  --  19.8*  --  20.8*  --   --  19.1*  --   HGB 8.0*   < > 8.2*   < > 8.3*   < > 8.7* 8.5* 9.3* 8.8* 8.2*  HCT 25.9*   < > 26.4*   < > 26.9*   < > 28.5* 25.0* 30.5* 28.8* 24.0*  MCV 86.9  --  86.6  --  87.1  --  88.2  --   --  89.2  --   PLT 146*  --  149*  --  151  --  139*  --   --  157  --    < > = values in this interval not displayed.    Basic Metabolic Panel: Recent Labs  Lab 10/21/23 1215 10/21/23 1216 10/21/23 1548 10/21/23 1601 10/22/23 0410 10/22/23 0413 10/22/23 1618 10/22/23 1630 10/23/23 0358 10/23/23 0405 10/23/23 1552  10/23/23 1600 10/23/23 2330 10/24/23 0039 10/24/23 0440 10/24/23 0442 10/24/23 1509 10/24/23 1610 10/24/23 1616 10/25/23 0453 10/25/23 0601  NA  --    < >  --    < > 147*   < > 144   < > 145   < > 144   < >  --    < > 147*   < > 149* 149* 149* 147* 149*  K  --    < >  --    < > 3.7   < > 4.2   < > 3.7   < > 3.7   < >  --    < > 3.7   < > 3.6 3.6 3.6 3.8 3.7  CL  --   --   --    < > 112*  --  108  --  109  --  107  --   --   --  109  --   --  109  --  109  --   CO2  --   --   --    < > 25  --  25  --  32  --  26  --   --   --  27  --   --  29  --  28  --   GLUCOSE  --   --   --    < > 116*  --  96  --  160*  --  121*  --   --   --  131*  --   --  135*  --  117*  --   BUN  --   --   --    < > 9  --  11  --  16  --  15  --   --   --  15  --   --  17  --  20  --   CREATININE  --   --   --    < > 0.92  --  0.90  --  0.87  --  0.92  --   --   --  0.84  --   --  0.96  --  1.09*  --   CALCIUM  --   --   --    < > 7.5*  --  8.0*  --  8.3*  --  8.8*  --   --   --  9.0  --   --  8.7*  --  8.9  --   MG 2.8*  --  2.7*  --  2.1  --  2.1  --  2.1  --   --   --   --   --  2.4  --   --  2.4  --  2.5*  --   PHOS 3.6  --  3.3  --  2.3*  --  4.4  --   --   --   --   --  3.3  --   --   --   --   --   --   --   --    < > = values in this interval not displayed.   GFR: Estimated Creatinine Clearance: 81 mL/min (A) (by C-G formula based on SCr of 1.09 mg/dL (H)). Recent Labs  Lab 10/19/23 1559 10/19/23 1639 10/23/23 0950 10/23/23 1114 10/24/23 0440 10/24/23 0631 10/24/23 1610 10/24/23 1626 10/24/23 1907 10/25/23 0453  PROCALCITON <0.10  --   --   --   --   --   --   --   --   --   WBC 15.2*   < >  --  20.5* 19.8*  --  20.8*  --   --  19.1*  LATICACIDVEN  --    < > 1.9  --   --  1.0  --  0.7 0.6  --    < > = values in this interval not displayed.    Liver Function Tests: Recent Labs  Lab 10/19/23 1559 10/20/23 1525 10/20/23 2344  AST 28  --  18  ALT 12  --  9  ALKPHOS 22*  --  14*  BILITOT  0.4  --  1.2  PROT 7.3  --  5.1*  ALBUMIN 3.3* 3.1* 2.8*   No results for input(s): LIPASE, AMYLASE in the last 168 hours. No results for input(s): AMMONIA in the last 168 hours.  ABG    Component Value Date/Time   PHART 7.413 10/25/2023 0601   PCO2ART 45.4 10/25/2023 0601   PO2ART 88 10/25/2023 0601   HCO3 29.1 (H) 10/25/2023 0601   TCO2 30 10/25/2023 0601   ACIDBASEDEF 2.0 10/21/2023 1813   O2SAT 97 10/25/2023 0601     Coagulation Profile: Recent Labs  Lab 10/19/23 1710 10/20/23 2344  INR 1.0 1.3*    Cardiac Enzymes: No results for input(s): CKTOTAL, CKMB, CKMBINDEX, TROPONINI in the last 168 hours.  HbA1C: Hgb A1c MFr Bld  Date/Time Value Ref Range Status  10/20/2023 06:43 AM 5.0 4.8 - 5.6 % Final    Comment:    (NOTE) Diagnosis of Diabetes The following HbA1c ranges recommended by the American Diabetes Association (ADA) may be used as an aid in the diagnosis of diabetes mellitus.  Hemoglobin             Suggested A1C NGSP%              Diagnosis  <5.7                   Non Diabetic  5.7-6.4                Pre-Diabetic  >6.4                   Diabetic  <7.0                   Glycemic control for  adults with diabetes.      CBG: Recent Labs  Lab 10/24/23 1614 10/24/23 2024 10/24/23 2342 10/25/23 0357 10/25/23 0747  GLUCAP 136* 113* 113* 118* 111*    The patient is critically ill due to acute bilateral submassive PE/acute respiratory failure/cardiogenic shock status post VA ECMO.  Critical care was necessary to treat or prevent imminent or life-threatening deterioration.  Critical care was time spent personally by me on the following activities: development of treatment plan with patient and/or surrogate as well as nursing, discussions with consultants, evaluation of patient's response to treatment, examination of patient, obtaining history from patient or surrogate, ordering and performing treatments and  interventions, ordering and review of laboratory studies, ordering and review of radiographic studies, pulse oximetry, re-evaluation of patient's condition and participation in multidisciplinary rounds.   During this encounter critical care time was devoted to patient care services described in this note for 36 minutes.   Valinda Novas, MD [facility] See Amion for pager If no response to pager, please call [phone] until 7pm After 7pm, Please call E-link [phone]

## 2023-10-25 NOTE — Progress Notes (Signed)
 Pharmacy Electrolyte Replacement  Recent Labs:  Recent Labs    10/25/23 0451 10/25/23 0453 10/25/23 1647 10/25/23 1704  K  --    < > 3.3* 3.2*  MG  --    < > 2.4  --   PHOS 5.2*  --   --   --   CREATININE  --    < > 0.88  --    < > = values in this interval not displayed.    Low Critical Values (K </= 2.5, Phos </= 1, Mg </= 1) Present: None  MD Contacted: n/a  Plan: 40 mEq KCL (10 mEq IV x 4 runs) and 40 mEq per tube per protocol.   Rankin Sams, PharmD, BCPS, BCCCP Clinical Pharmacist

## 2023-10-25 NOTE — Progress Notes (Addendum)
 eLink Physician-Brief Progress Note Patient Name: Anita Aguirre DOB: 1975-06-07 MRN: [id number]   Date of Service  10/25/2023  HPI/Events of Note  Patient with pulmonary cardiogenic shock.  I decannulated.  PEEP has been escalated to 12, saturat appropriately with intermittent desaturation, escalating to 70% FiO2.  Saturating saturation percent  eICU Interventions  Initiate norepinephrine for borderline blood pressures, maintain milrinone 0.125  Obtain ABG.  Unclear if patient's VSS ventilatory support.  Favor lower pressure ventilation in the setting of RV dysfunction.  Pending ABG, will adjust ventilator as needed   0624 -ABG reviewed, return to PEEP of 10, 6 cc/kg, maintain 70% FiO2; no repeat ABG is required at this time  Intervention Category Intermediate Interventions: Respiratory distress - evaluation and management  Jacquel Redditt 10/25/2023, 5:39 AM

## 2023-10-25 NOTE — Progress Notes (Signed)
 PHARMACY - ANTICOAGULATION CONSULT NOTE  Pharmacy Consult for bivalirudin > heparin Indication: pulmonary embolus/acute L DVT/ECMO >> decannulation 7/2  Allergies  Allergen Reactions   Wellbutrin [Bupropion] Hives, Itching and Other (See Comments)    Skin crawling sensation    Patient Measurements: Height: 5' 7 (170.2 cm) Weight: 110.8 kg (244 lb 4.3 oz) IBW/kg (Calculated) : 61.6 HEPARIN DW (KG): 86.4  Vital Signs: Temp: 97.9 F (36.6 C) (07/03 1745) Temp Source: Bladder (07/03 1200) Pulse Rate: 60 (07/03 1745)  Labs: Recent Labs    10/24/23 0440 10/24/23 0442 10/24/23 1011 10/24/23 1152 10/24/23 1610 10/24/23 1616 10/25/23 0252 10/25/23 0453 10/25/23 0601 10/25/23 0943 10/25/23 1647 10/25/23 1704  HGB 8.3*   < > 8.5*   < > 8.7*   < >  --  8.8*   < > 8.7* 8.8* 8.5*  HCT 26.9*   < > 27.3*   < > 28.5*   < >  --  28.8*   < > 27.9* 28.5* 25.0*  PLT 151  --   --   --  139*  --   --  157  --   --   --   --   APTT 72*  --  63*  --  50*  --   --   --   --   --   --   --   HEPARINUNFRC  --   --   --   --   --   --  0.18*  --   --  0.28* 0.37  --   CREATININE 0.84  --   --   --  0.96  --   --  1.09*  --   --   --   --    < > = values in this interval not displayed.    Estimated Creatinine Clearance: 81 mL/min (A) (by C-G formula based on SCr of 1.09 mg/dL (H)).  Assessment: 5 YOF admitted with submassive PE with right heart strain. RV/LV ration 1.4. CT with bilateral PE with severe clot burden. Patient not on anticoagulation prior to hospitalization. Patient also has ongoing vaginal bleeding due to uterine fibroids, noted to be a chronic issue. Pharmacy initially consulted for heparin dosing. Patient later decompensated, started on ECMO on 6/28 PM.  6/28 PM VA ECMO cannulation + catheter directed alteplase + uterine artery embolization (hx uterine fibroids); heparin overnight, switched to bival 6/29 AM 7/2 VA ECMO decannulation - started on heparin @1900   Heparin level  0.28 slightly below goal on 1800 units/hr.  No new s/sx bleeding, ongoing vaginal bleeding (stable - ~2 pad changes/day).  Hgb 8.2, pltc 157 this morning   2nd: HL therapeutic at 0.37. Started on megace. ObGyn considering aygestin or provera .   Goal of Therapy:  Heparin level 0.3-0.7 units/ml Monitor platelets by anticoagulation protocol: Yes   Plan:  Continue heparin infusion at 1950 units/hr  Confirmatory 6 hour heparin level Monitor CBC and s/sx of bleeding daily  Rankin Sams, PharmD, BCPS, BCCCP Clinical Pharmacist

## 2023-10-25 NOTE — Progress Notes (Addendum)
 Nutrition Follow-up  DOCUMENTATION CODES:   Not applicable  INTERVENTION:    Re-initiate TF at trickle rate: Vital 1.5 at 50 ml/h (1200 ml per day) Re-initiate at trickle and DO NOT ADVANCE Prosource TF20 60 ml daily Free water flushes (per MD) 50ml q4 hours ( per day)   Provides 1880 kcal, 101 gm protein, 1125 ml free water daily    Continue Thiamine 100 mg daily   Monitor magnesium, potassium, and phosphorus daily for at least 3 days, MD to replete as needed  NUTRITION DIAGNOSIS:  Inadequate oral intake related to inability to eat as evidenced by NPO status.  GOAL:  Patient will meet greater than or equal to 90% of their needs  MONITOR:  TF tolerance, Skin, Diet advancement, Labs, I & O's  REASON FOR ASSESSMENT:  Consult Enteral/tube feeding initiation and management  ASSESSMENT:  Pt with PMH significant for: ADD, arthritis, HTN, hypothyroidism, cystic mastopathy, mastitis and bleeding of uterine fibroids. Presented to [facility] with c/o worsening SOB and chest pain x2 days. Found to have pulmonary embolism w/ R heart strain. Not a good candidate for clot extraction. Transferred to [facility] and underwent VA ECMO and catheter directed thrombolysis.  6/28 transferred to [facility], 3 units PRBCs transfused; ECMO initiated 6/29 intubated; TFs initiated 6/30 still advancing TFs, tPA completed and catheter removed; ECHO: LV EF 40-45%; RV systolic function severely reduced 7/1 NPO r/t vomiting and c/f aspiration 7/2 ECMO decannulation 7/3 Cortrak placed; trickles re-initiated    Patient successfully decannulated yesterday. Became hypoxic overnight. Vent requirements increased. Started on low dose pressor, which has not been stopped.   Patient is currently intubated on ventilator support MV: 9.8 L/min Temp (24hrs), Avg:98.3 F (36.8 C), Min:95.9 F (35.5 C), Max:99.1 F (37.3 C)  OGT remains in place with green, bilious output overnight, however decreased throughout the  course of the day yesterday. Plans for Cortrak placement today in preparation for eventual extubation. MD amicable to re-initiating trickles to help stimulate the gut. No BM yet. Sorbitol enema ordered.     Admit weight: 106.2kg  Current weight: 110.8kg +moderate pitting edema to BLEs   Net negative 4L since admission. Notable edema to BLEs on exam. Weight above admission weight. CVP 9. Unsure of patient's UBW, as she is unable to endorse.    Intake/Output Summary (Last 24 hours) at 10/25/2023 0945 Last data filed at 10/25/2023 0900 Gross per 24 hour  Intake 2862.67 ml  Output 3480 ml  Net -617.33 ml    Net IO Since Admission: -4,219.65 mL [10/25/23 0945]    Received potassium supplementation yesterday. Ordered again today. Hgb with slight trend down, but stable.  Drains/Lines: OGT(gastric) placed 6/29 RIJ: CVC triple lumen LIJ: double lumen introducer R radial: a-line L groin: wound vac: 0ml x12 hours Foley catheter UOP: 2L x24 hours   Meds:  famotidine, SSI Novolog, lactulose TID, levothyroxine, Miralax, senna-docusate, thiamine, IV ABX Drips: 10 mEq KCl x4 Precedex 0.4 Dilaudid 5.5 Versed 10   Labs Reviewed: Na+ 852>850>850(Y) PHOS 2.3 (L) Mg 2.4>2.5 (H) Hgb 8.8>8.2>7.8 (L) WBC 19.8>20.8>19.1 (H) CBG ranges from 117-135 mg/dL over the last 24 hours HgbA1c 5.0 (09/2023)      Diet Order:   Diet Order             Diet NPO time specified  Diet effective midnight             EDUCATION NEEDS:  Not appropriate for education at this time  Skin:  Skin Assessment: Reviewed  RN Assessment  Last BM:  6/27  Height:  Ht Readings from Last 1 Encounters:  10/25/23 5' 7 (1.702 m)   Weight:  Wt Readings from Last 1 Encounters:  10/25/23 110.8 kg   Ideal Body Weight:  61.4 kg  BMI:  Body mass index is 38.26 kg/m.  Estimated Nutritional Needs:   Kcal:  1700-1900 kcals  Protein:  95-110g  Fluid:  >1.9L/day  Blair Deaner MS, RD, LDN Registered  Dietitian Clinical Nutrition RD Inpatient Contact Info in Amion

## 2023-10-25 NOTE — Progress Notes (Addendum)
  Progress Note    10/25/2023 6:33 AM 1 Day Post-Op  Subjective:  intubated/sedated  Afebrile   Vitals:   10/25/23 0615 10/25/23 0630  BP:    Pulse: 81 79  Resp:    Temp:    SpO2: 97% 98%    Physical Exam: General:  no distress Lungs:  intubated on .70 FiO2 Incisions:  left groin with prevena in tact; right groin with stitch present Extremities:  easily palpable right DP pulse; faintly palpable left DP pulse with brisk biphasic doppler flow Abdomen:  soft  CBC    Component Value Date/Time   WBC 19.1 (H) 10/25/2023 0453   RBC 3.23 (L) 10/25/2023 0453   HGB 8.2 (L) 10/25/2023 0601   HGB 12.9 05/19/2012 0947   HCT 24.0 (L) 10/25/2023 0601   HCT 37.4 05/19/2012 0947   PLT 157 10/25/2023 0453   PLT 294 05/19/2012 0947   MCV 89.2 10/25/2023 0453   MCV 91 05/19/2012 0947   MCH 27.2 10/25/2023 0453   MCHC 30.6 10/25/2023 0453   RDW 18.5 (H) 10/25/2023 0453   RDW 14.7 (H) 05/19/2012 0947   LYMPHSABS 2.6 10/07/2021 1235   LYMPHSABS 2.8 05/19/2012 0947   MONOABS 0.7 10/07/2021 1235   MONOABS 0.8 05/19/2012 0947   EOSABS 0.4 10/07/2021 1235   EOSABS 0.3 05/19/2012 0947   BASOSABS 0.0 10/07/2021 1235   BASOSABS 0.1 05/19/2012 0947    BMET    Component Value Date/Time   NA 149 (H) 10/25/2023 0601   NA 143 05/19/2012 0947   K 3.7 10/25/2023 0601   K 3.8 05/19/2012 0947   CL 109 10/25/2023 0453   CL 105 05/19/2012 0947   CO2 28 10/25/2023 0453   CO2 26 05/19/2012 0947   GLUCOSE 117 (H) 10/25/2023 0453   GLUCOSE 94 05/19/2012 0947   BUN 20 10/25/2023 0453   BUN 8 05/19/2012 0947   CREATININE 1.09 (H) 10/25/2023 0453   CREATININE 0.97 05/19/2012 0947   CALCIUM 8.9 10/25/2023 0453   CALCIUM 9.3 05/19/2012 0947   GFRNONAA >60 10/25/2023 0453   GFRNONAA >60 05/19/2012 0947   GFRAA >60 08/07/2018 2007   GFRAA >60 05/19/2012 0947    INR    Component Value Date/Time   INR 1.3 (H) 10/20/2023 2344     Intake/Output Summary (Last 24 hours) at 10/25/2023  9366 Last data filed at 10/25/2023 0610 Gross per 24 hour  Intake 2666.49 ml  Output 3340 ml  Net -673.51 ml      Assessment/Plan:  48 y.o. female is s/p:  ECMO decannulation Iliofemoral, femoral-popliteal embolectomy Primary repair left common femoral artery Primary repair superficial femoral artery Prevena vacuum dressing  1 Day Post-Op   -pt with palpable DP pulses this morning.  -dry gauze to right groin daily.  Do not tape in place-just tuck to wick moisture.  Continue prevena probably for one week.  -pt on heparin gtt -please call with questions.    Lucie Apt, PA-C [facility] [phone] 10/25/2023 6:33 AM  VASCULAR STAFF ADDENDUM: I have independently interviewed and examined the patient. I agree with the above.  No hematoma in the left or right groins Palpable pulses in the feet bilaterally. Praveena in place.  This can be removed on 7/9 Appreciate excellent care from nursing and primary team.  Fonda FORBES Rim MD [facility] Phone Number: [phone] 10/25/2023 8:06 AM

## 2023-10-25 NOTE — Plan of Care (Signed)
  Problem: Clinical Measurements: Goal: Cardiovascular complication will be avoided Outcome: Progressing   Problem: Elimination: Goal: Will not experience complications related to urinary retention Outcome: Progressing   Problem: Clinical Measurements: Goal: Respiratory complications will improve Outcome: Not Progressing   Problem: Activity: Goal: Risk for activity intolerance will decrease Outcome: Not Progressing   Problem: Elimination: Goal: Will not experience complications related to bowel motility Outcome: Not Progressing

## 2023-10-25 NOTE — Progress Notes (Signed)
 PHARMACY - ANTICOAGULATION CONSULT NOTE  Pharmacy Consult for bivalirudin > heparin Indication: pulmonary embolus/acute L DVT/ECMO >> decannulation 7/2  Allergies  Allergen Reactions   Wellbutrin [Bupropion] Hives, Itching and Other (See Comments)    Skin crawling sensation    Patient Measurements: Weight: 108.3 kg (238 lb 12.1 oz)  Vital Signs: Temp: 98.6 F (37 C) (07/03 0200) Temp Source: Bladder (07/02 2000) BP: 120/64 (07/02 2333) Pulse Rate: 86 (07/03 0215)  Labs: Recent Labs    10/23/23 1114 10/23/23 1115 10/23/23 1552 10/23/23 1600 10/24/23 0440 10/24/23 0442 10/24/23 1011 10/24/23 1152 10/24/23 1610 10/24/23 1616 10/24/23 2300 10/25/23 0252  HGB 8.2*   < > 7.9*   < > 8.3*   < > 8.5*   < > 8.7* 8.5* 9.3*  --   HCT 26.4*   < > 26.0*   < > 26.9*   < > 27.3*   < > 28.5* 25.0* 30.5*  --   PLT 149*  --   --   --  151  --   --   --  139*  --   --   --   APTT 72*   < >  --    < > 72*  --  63*  --  50*  --   --   --   HEPARINUNFRC  --   --   --   --   --   --   --   --   --   --   --  0.18*  CREATININE  --   --  0.92  --  0.84  --   --   --  0.96  --   --   --    < > = values in this interval not displayed.    Estimated Creatinine Clearance: 90.8 mL/min (by C-G formula based on SCr of 0.96 mg/dL).  Assessment: 54 YOF admitted with submassive PE with right heart strain. RV/LV ration 1.4. CT with bilateral PE with severe clot burden. Patient not on anticoagulation prior to hospitalization. Patient also has ongoing vaginal bleeding due to uterine fibroids, noted to be a chronic issue. Pharmacy initially consulted for heparin dosing. Patient later decompensated, started on ECMO on 6/28 PM.  6/28 PM VA ECMO cannulation + catheter directed alteplase + uterine artery embolization (hx uterine fibroids); heparin overnight, switched to bival 6/29 AM 7/2 VA ECMO decannulation  Previously therapeutic on heparin 1600 units/hr (HL 0.49).  Still with ongoing vaginal bleeding due  to uterine fibroids - needing 3-4 pad changes per day. aPTT 50 at 1600 shortly after bival stopped.   AM: heparin level below goal on 1600 units/hr. Per RN, no NEW signs/symptoms of bleeding, continued vaginal bleeding unchanged from heparin gtt start. No issues with the heparin infusion running continuously. Last CBC shows Hgb 9.3, plts 139  Goal of Therapy:  Heparin level 0.3-0.7 units/ml Monitor platelets by anticoagulation protocol: Yes   Plan:  Increase heparin infusion to 1800 units/hr  6 hour heparin level Monitor CBC and s/sx of bleeding daily  Lynwood Poplar, PharmD, BCPS Clinical Pharmacist 10/25/2023 3:30 AM

## 2023-10-25 NOTE — Progress Notes (Signed)
 PHARMACY - ANTICOAGULATION CONSULT NOTE  Pharmacy Consult for bivalirudin > heparin Indication: pulmonary embolus/acute L DVT/ECMO >> decannulation 7/2  Allergies  Allergen Reactions   Wellbutrin [Bupropion] Hives, Itching and Other (See Comments)    Skin crawling sensation    Patient Measurements: Height: 5' 7 (170.2 cm) Weight: 110.8 kg (244 lb 4.3 oz) IBW/kg (Calculated) : 61.6 HEPARIN DW (KG): 86.4  Vital Signs: Temp: 98.6 F (37 C) (07/03 2000) Temp Source: Core (07/03 1600) Pulse Rate: 63 (07/03 2100)  Labs: Recent Labs    10/24/23 0440 10/24/23 0442 10/24/23 1011 10/24/23 1152 10/24/23 1610 10/24/23 1616 10/25/23 0453 10/25/23 0601 10/25/23 0943 10/25/23 1647 10/25/23 1704 10/25/23 2247  HGB 8.3*   < > 8.5*   < > 8.7*   < > 8.8*   < > 8.7* 8.8* 8.5* 8.7*  HCT 26.9*   < > 27.3*   < > 28.5*   < > 28.8*   < > 27.9* 28.5* 25.0* 28.1*  PLT 151  --   --   --  139*  --  157  --   --   --   --   --   APTT 72*  --  63*  --  50*  --   --   --   --   --   --   --   HEPARINUNFRC  --   --   --   --   --    < >  --   --  0.28* 0.37  --  0.44  CREATININE 0.84  --   --   --  0.96  --  1.09*  --   --  0.88  --   --    < > = values in this interval not displayed.    Estimated Creatinine Clearance: 100.3 mL/min (by C-G formula based on SCr of 0.88 mg/dL).  Assessment: 6 YOF admitted with submassive PE with right heart strain. RV/LV ration 1.4. CT with bilateral PE with severe clot burden. Patient not on anticoagulation prior to hospitalization. Patient also has ongoing vaginal bleeding due to uterine fibroids, noted to be a chronic issue. Pharmacy initially consulted for heparin dosing. Patient later decompensated, started on ECMO on 6/28 PM.  6/28 PM VA ECMO cannulation + catheter directed alteplase + uterine artery embolization (hx uterine fibroids); heparin overnight, switched to bival 6/29 AM 7/2 VA ECMO decannulation - started on heparin @1900   -Confirmatory heparin  level therapeutic at 0.44. Per RN, no issues with gtt or new signs/symptoms of bleeding. Started on megace. ObGyn considering aygestin or provera .   Goal of Therapy:  Heparin level 0.3-0.7 units/ml Monitor platelets by anticoagulation protocol: Yes   Plan:  Continue heparin infusion at 1950 units/hr  Daily heparin level Monitor CBC and s/sx of bleeding daily  Lynwood Poplar, PharmD, BCPS Clinical Pharmacist 10/25/2023 11:23 PM

## 2023-10-25 NOTE — Procedures (Addendum)
 Cortrak  Tube Type:  Cortrak - 43 inches Tube Location:  Left nare Initial Placement:  Stomach Secured by: Bridle Technique Used to Measure Tube Placement:  Marking at nare/corner of mouth Cortrak Secured At:  70 cm   Cortrak Tube Team Note:  Consult received to place a Cortrak feeding tube.   X-ray is required. RN may begin using tube once confirmed in good position.   If the tube becomes dislodged please keep the tube and contact the Cortrak team at [email or web address] for replacement.  If after hours and replacement cannot be delayed, place a NG tube and confirm placement with an abdominal x-ray.    Torrance Freestone MS, RD, LDN If unable to be reached, please send secure chat to "RD inpatient" available from 8:00a-4:00p daily

## 2023-10-25 NOTE — Progress Notes (Addendum)
 PHARMACY - ANTICOAGULATION CONSULT NOTE  Pharmacy Consult for bivalirudin > heparin Indication: pulmonary embolus/acute L DVT/ECMO >> decannulation 7/2  Allergies  Allergen Reactions   Wellbutrin [Bupropion] Hives, Itching and Other (See Comments)    Skin crawling sensation    Patient Measurements: Height: 5' 7 (170.2 cm) Weight: 110.8 kg (244 lb 4.3 oz) IBW/kg (Calculated) : 61.6 HEPARIN DW (KG): 86.4  Vital Signs: Temp: 97.7 F (36.5 C) (07/03 1015) Temp Source: Bladder (07/03 0800) BP: 101/56 (07/03 0335) Pulse Rate: 63 (07/03 1015)  Labs: Recent Labs    10/24/23 0440 10/24/23 0442 10/24/23 1011 10/24/23 1152 10/24/23 1610 10/24/23 1616 10/25/23 0252 10/25/23 0453 10/25/23 0601 10/25/23 0907 10/25/23 0943  HGB 8.3*   < > 8.5*   < > 8.7*   < >  --  8.8* 8.2* 7.8* 8.7*  HCT 26.9*   < > 27.3*   < > 28.5*   < >  --  28.8* 24.0* 23.0* 27.9*  PLT 151  --   --   --  139*  --   --  157  --   --   --   APTT 72*  --  63*  --  50*  --   --   --   --   --   --   HEPARINUNFRC  --   --   --   --   --   --  0.18*  --   --   --  0.28*  CREATININE 0.84  --   --   --  0.96  --   --  1.09*  --   --   --    < > = values in this interval not displayed.    Estimated Creatinine Clearance: 81 mL/min (A) (by C-G formula based on SCr of 1.09 mg/dL (H)).  Assessment: 72 YOF admitted with submassive PE with right heart strain. RV/LV ration 1.4. CT with bilateral PE with severe clot burden. Patient not on anticoagulation prior to hospitalization. Patient also has ongoing vaginal bleeding due to uterine fibroids, noted to be a chronic issue. Pharmacy initially consulted for heparin dosing. Patient later decompensated, started on ECMO on 6/28 PM.  6/28 PM VA ECMO cannulation + catheter directed alteplase + uterine artery embolization (hx uterine fibroids); heparin overnight, switched to bival 6/29 AM 7/2 VA ECMO decannulation - started on heparin @1900   Heparin level 0.28 slightly below  goal on 1800 units/hr.  No new s/sx bleeding, ongoing vaginal bleeding (stable - ~2 pad changes/day).  Hgb 8.2, pltc 157 this morning   Goal of Therapy:  Heparin level 0.3-0.7 units/ml Monitor platelets by anticoagulation protocol: Yes   Plan:  Increase heparin infusion to 1950 units/hr  6 hour heparin level Monitor CBC and s/sx of bleeding daily  Maurilio Fila, PharmD Clinical Pharmacist 10/25/2023  11:39 AM

## 2023-10-25 NOTE — Progress Notes (Signed)
 Reviewed patient bleeding with Dr. Harold. Reportedly improving. Discussed that patient can be transitioned to aygestin or provera , which are less potent and thus lower risk of thrombosis. However, upon review of MAR, looks like she has not received the megace. Can continue to observe bleeding without medical intervention since likely improving s/p uterine artery embolization. Additionally reviewed need for uterine biopsy either while inpatient when she is more stable or as an outpatient if she is reliable for follow up.  Rollo ONEIDA Bring, MD, FACOG Obstetrician & Gynecologist, [facility], [facility]

## 2023-10-26 ENCOUNTER — Inpatient Hospital Stay (HOSPITAL_COMMUNITY): Payer: Self-pay

## 2023-10-26 LAB — CBC
HCT: 29.8 % — ABNORMAL LOW (ref 36.0–46.0)
Hemoglobin: 9.2 g/dL — ABNORMAL LOW (ref 12.0–15.0)
MCH: 26.7 pg (ref 26.0–34.0)
MCHC: 30.9 g/dL (ref 30.0–36.0)
MCV: 86.4 fL (ref 80.0–100.0)
Platelets: 111 K/uL — ABNORMAL LOW (ref 150–400)
RBC: 3.45 MIL/uL — ABNORMAL LOW (ref 3.87–5.11)
RDW: 18.4 % — ABNORMAL HIGH (ref 11.5–15.5)
WBC: 18.3 K/uL — ABNORMAL HIGH (ref 4.0–10.5)
nRBC: 0.7 % — ABNORMAL HIGH (ref 0.0–0.2)

## 2023-10-26 LAB — BASIC METABOLIC PANEL WITH GFR
Anion gap: 11 (ref 5–15)
Anion gap: 13 (ref 5–15)
BUN: 21 mg/dL — ABNORMAL HIGH (ref 6–20)
BUN: 22 mg/dL — ABNORMAL HIGH (ref 6–20)
CO2: 30 mmol/L (ref 22–32)
CO2: 30 mmol/L (ref 22–32)
Calcium: 9.4 mg/dL (ref 8.9–10.3)
Calcium: 9.4 mg/dL (ref 8.9–10.3)
Chloride: 110 mmol/L (ref 98–111)
Chloride: 107 mmol/L (ref 98–111)
Creatinine, Ser: 0.8 mg/dL (ref 0.44–1.00)
Creatinine, Ser: 0.83 mg/dL (ref 0.44–1.00)
GFR, Estimated: >60 mL/min (ref >60)
GFR, Estimated: >60 mL/min (ref >60)
Glucose, Bld: 109 mg/dL — ABNORMAL HIGH (ref 70–99)
Glucose, Bld: 137 mg/dL — ABNORMAL HIGH (ref 70–99)
Potassium: 3.3 mmol/L — ABNORMAL LOW (ref 3.5–5.1)
Potassium: 3.2 mmol/L — ABNORMAL LOW (ref 3.5–5.1)
Sodium: 151 mmol/L — ABNORMAL HIGH (ref 135–145)
Sodium: 150 mmol/L — ABNORMAL HIGH (ref 135–145)

## 2023-10-26 LAB — POCT I-STAT 7, (LYTES, BLD GAS, ICA,H+H)
Acid-Base Excess: 9 mmol/L — ABNORMAL HIGH (ref 0.0–2.0)
Acid-Base Excess: 8 mmol/L — ABNORMAL HIGH (ref 0.0–2.0)
Acid-Base Excess: 10 mmol/L — ABNORMAL HIGH (ref 0.0–2.0)
Acid-Base Excess: 9 mmol/L — ABNORMAL HIGH (ref 0.0–2.0)
Bicarbonate: 33.5 mmol/L — ABNORMAL HIGH (ref 20.0–28.0)
Bicarbonate: 31.4 mmol/L — ABNORMAL HIGH (ref 20.0–28.0)
Bicarbonate: 33.7 mmol/L — ABNORMAL HIGH (ref 20.0–28.0)
Bicarbonate: 32.1 mmol/L — ABNORMAL HIGH (ref 20.0–28.0)
Calcium, Ion: 1.26 mmol/L (ref 1.15–1.40)
Calcium, Ion: 1.25 mmol/L (ref 1.15–1.40)
Calcium, Ion: 1.25 mmol/L (ref 1.15–1.40)
Calcium, Ion: 1.27 mmol/L (ref 1.15–1.40)
HCT: 27 % — ABNORMAL LOW (ref 36.0–46.0)
HCT: 28 % — ABNORMAL LOW (ref 36.0–46.0)
HCT: 28 % — ABNORMAL LOW (ref 36.0–46.0)
HCT: 27 % — ABNORMAL LOW (ref 36.0–46.0)
Hemoglobin: 9.2 g/dL — ABNORMAL LOW (ref 12.0–15.0)
Hemoglobin: 9.5 g/dL — ABNORMAL LOW (ref 12.0–15.0)
Hemoglobin: 9.5 g/dL — ABNORMAL LOW (ref 12.0–15.0)
Hemoglobin: 9.2 g/dL — ABNORMAL LOW (ref 12.0–15.0)
O2 Saturation: 58 %
O2 Saturation: 97 %
O2 Saturation: 97 %
O2 Saturation: 98 %
Patient temperature: 36.7
Patient temperature: 37.8
Patient temperature: 37.7
Potassium: 3.5 mmol/L (ref 3.5–5.1)
Potassium: 3.5 mmol/L (ref 3.5–5.1)
Potassium: 3.3 mmol/L — ABNORMAL LOW (ref 3.5–5.1)
Potassium: 3.3 mmol/L — ABNORMAL LOW (ref 3.5–5.1)
Sodium: 151 mmol/L — ABNORMAL HIGH (ref 135–145)
Sodium: 151 mmol/L — ABNORMAL HIGH (ref 135–145)
Sodium: 150 mmol/L — ABNORMAL HIGH (ref 135–145)
Sodium: 151 mmol/L — ABNORMAL HIGH (ref 135–145)
TCO2: 35 mmol/L — ABNORMAL HIGH (ref 22–32)
TCO2: 33 mmol/L — ABNORMAL HIGH (ref 22–32)
TCO2: 35 mmol/L — ABNORMAL HIGH (ref 22–32)
TCO2: 33 mmol/L — ABNORMAL HIGH (ref 22–32)
pCO2 arterial: 46.6 mmHg (ref 32–48)
pCO2 arterial: 39.2 mmHg (ref 32–48)
pCO2 arterial: 43.9 mmHg (ref 32–48)
pCO2 arterial: 40.2 mmHg (ref 32–48)
pH, Arterial: 7.464 — ABNORMAL HIGH (ref 7.35–7.45)
pH, Arterial: 7.512 — ABNORMAL HIGH (ref 7.35–7.45)
pH, Arterial: 7.496 — ABNORMAL HIGH (ref 7.35–7.45)
pH, Arterial: 7.512 — ABNORMAL HIGH (ref 7.35–7.45)
pO2, Arterial: 29 mmHg — CL (ref 83–108)
pO2, Arterial: 86 mmHg (ref 83–108)
pO2, Arterial: 85 mmHg (ref 83–108)
pO2, Arterial: 96 mmHg (ref 83–108)

## 2023-10-26 LAB — TYPE AND SCREEN
ABO/RH(D): O POS
ABO/RH(D): O POS
Antibody Screen: NEGATIVE
Antibody Screen: NEGATIVE
Unit division: 0
Unit division: 0
Unit division: 0
Unit division: 0
Unit division: 0
Unit division: 0
Unit division: 0
Unit division: 0
Unit division: 0

## 2023-10-26 LAB — BPAM RBC
Blood Product Expiration Date: 202507282359
Blood Product Expiration Date: 202507282359
Blood Product Expiration Date: 202507282359
Blood Product Expiration Date: 202508052359
Blood Product Expiration Date: 202508062359
Blood Product Expiration Date: 202508062359
Blood Product Expiration Date: 202508062359
Blood Product Expiration Date: 202508062359
Blood Product Expiration Date: 202508062359
ISSUE DATE / TIME: 202507011714
ISSUE DATE / TIME: 202507021237
ISSUE DATE / TIME: 202507021237
ISSUE DATE / TIME: 202507021237
ISSUE DATE / TIME: 202507030903
Unit Type and Rh: 5100
Unit Type and Rh: 5100
Unit Type and Rh: 5100
Unit Type and Rh: 5100
Unit Type and Rh: 5100
Unit Type and Rh: 5100
Unit Type and Rh: 5100
Unit Type and Rh: 5100
Unit Type and Rh: 5100

## 2023-10-26 LAB — HEMOGLOBIN AND HEMATOCRIT, BLOOD
HCT: 31.7 % — ABNORMAL LOW (ref 36.0–46.0)
HCT: 33.2 % — ABNORMAL LOW (ref 36.0–46.0)
Hemoglobin: 9.7 g/dL — ABNORMAL LOW (ref 12.0–15.0)
Hemoglobin: 10.1 g/dL — ABNORMAL LOW (ref 12.0–15.0)

## 2023-10-26 LAB — GLUCOSE, CAPILLARY
Glucose-Capillary: 126 mg/dL — ABNORMAL HIGH (ref 70–99)
Glucose-Capillary: 114 mg/dL — ABNORMAL HIGH (ref 70–99)
Glucose-Capillary: 100 mg/dL — ABNORMAL HIGH (ref 70–99)
Glucose-Capillary: 117 mg/dL — ABNORMAL HIGH (ref 70–99)
Glucose-Capillary: 128 mg/dL — ABNORMAL HIGH (ref 70–99)

## 2023-10-26 LAB — COOXEMETRY PANEL
Carboxyhemoglobin: 1.9 % — ABNORMAL HIGH (ref 0.5–1.5)
Carboxyhemoglobin: 1.8 % — ABNORMAL HIGH (ref 0.5–1.5)
Methemoglobin: <0.7 % (ref 0.0–1.5)
Methemoglobin: <0.7 % (ref 0.0–1.5)
O2 Saturation: 65.3 %
O2 Saturation: 69.6 %
Total hemoglobin: 9.9 g/dL — ABNORMAL LOW (ref 12.0–16.0)
Total hemoglobin: 11.7 g/dL — ABNORMAL LOW (ref 12.0–16.0)

## 2023-10-26 LAB — HEPARIN LEVEL (UNFRACTIONATED): Heparin Unfractionated: 0.34 [IU]/mL (ref 0.30–0.70)

## 2023-10-26 LAB — LACTATE DEHYDROGENASE: LDH: 595 U/L — ABNORMAL HIGH (ref 98–192)

## 2023-10-26 LAB — MAGNESIUM: Magnesium: 2.4 mg/dL (ref 1.7–2.4)

## 2023-10-26 LAB — PHOSPHORUS: Phosphorus: 3.5 mg/dL (ref 2.5–4.6)

## 2023-10-26 MED ORDER — POTASSIUM CHLORIDE CRYS ER 20 MEQ PO TBCR
20.0000 meq | EXTENDED_RELEASE_TABLET | ORAL | Status: DC
  Filled 2023-10-26: qty 1

## 2023-10-26 MED ORDER — ACETAZOLAMIDE SODIUM 500 MG IJ SOLR
500.0000 mg | Freq: Three times a day (TID) | INTRAMUSCULAR | Status: DC
  Administered 2023-10-26 – 2023-10-27 (×3): 500 mg via INTRAVENOUS
  Filled 2023-10-26 (×4): qty 500

## 2023-10-26 MED ORDER — POTASSIUM CHLORIDE 10 MEQ/50ML IV SOLN
10.0000 meq | INTRAVENOUS | Status: AC
  Administered 2023-10-26 (×2): 10 meq via INTRAVENOUS
  Filled 2023-10-26: qty 50

## 2023-10-26 MED ORDER — VITAL 1.5 CAL PO LIQD
1000.0000 mL | ORAL | Status: DC
  Administered 2023-10-26 – 2023-10-28 (×2): 1000 mL

## 2023-10-26 MED ORDER — POTASSIUM CHLORIDE 20 MEQ PO PACK
20.0000 meq | PACK | ORAL | Status: AC
  Administered 2023-10-26 (×2): 20 meq
  Filled 2023-10-26 (×2): qty 1

## 2023-10-26 MED ORDER — FREE WATER
150.0000 mL | Status: DC
  Administered 2023-10-26 – 2023-10-28 (×12): 150 mL

## 2023-10-26 MED ORDER — POTASSIUM CHLORIDE 20 MEQ PO PACK
40.0000 meq | PACK | ORAL | Status: AC
  Administered 2023-10-26 (×3): 40 meq
  Filled 2023-10-26 (×3): qty 2

## 2023-10-26 MED ORDER — POTASSIUM CHLORIDE 10 MEQ/50ML IV SOLN
10.0000 meq | INTRAVENOUS | Status: AC
  Administered 2023-10-26 (×4): 10 meq via INTRAVENOUS
  Filled 2023-10-26 (×4): qty 50

## 2023-10-26 MED ORDER — FENTANYL CITRATE PF 50 MCG/ML IJ SOSY
50.0000 ug | PREFILLED_SYRINGE | INTRAMUSCULAR | Status: DC | PRN
  Administered 2023-10-27: 50 ug via INTRAVENOUS
  Filled 2023-10-26: qty 1

## 2023-10-26 MED ORDER — BANATROL TF EN LIQD
60.0000 mL | Freq: Two times a day (BID) | ENTERAL | Status: DC
  Administered 2023-10-26 – 2023-10-27 (×4): 60 mL
  Filled 2023-10-26 (×5): qty 60

## 2023-10-26 MED ORDER — MIDAZOLAM HCL 2 MG/2ML IJ SOLN
2.0000 mg | INTRAMUSCULAR | Status: DC | PRN
  Administered 2023-10-26: 2 mg via INTRAVENOUS
  Filled 2023-10-26: qty 2

## 2023-10-26 MED ORDER — ACETAMINOPHEN 160 MG/5ML PO SOLN
650.0000 mg | Freq: Four times a day (QID) | ORAL | Status: DC | PRN
  Administered 2023-10-27 – 2023-11-02 (×7): 650 mg via ORAL
  Filled 2023-10-26 (×7): qty 20.3

## 2023-10-26 MED ORDER — STERILE WATER FOR INJECTION IJ SOLN
INTRAMUSCULAR | Status: AC
  Administered 2023-10-26: 10 mL
  Filled 2023-10-26: qty 10

## 2023-10-26 NOTE — Progress Notes (Addendum)
 eLink Physician-Brief Progress Note Patient Name: ITZAE MCCURDY DOB: [birth year] MRN: [id number]   Date of Service  10/26/2023  HPI/Events of Note  Sedation was turned off prior to shift change earlier today, she has been agitated and had several large bowel movements requiring restraints.  During this last time, the patient became extremely agitated despite being on Precedex  0.4.  Requesting some as needed sedation.  SF ratio ~100 and the patient is requiring 10 of PEEP and 80% FiO2.  Unlikely to be extubated today  eICU Interventions  And as needed Versed .  If that is insufficient for periods of agitation, may consider as needed analgesia with fentanyl    0258 - Add Flexiseal    Intervention Category Intermediate Interventions: Respiratory distress - evaluation and management  Isabel Freese 10/26/2023, 2:44 AM

## 2023-10-26 NOTE — Progress Notes (Signed)
 PHARMACY - ANTICOAGULATION CONSULT NOTE  Pharmacy Consult for bivalirudin  > heparin  Indication: pulmonary embolus/acute L DVT/ECMO >> decannulation 7/2  Allergies  Allergen Reactions   Wellbutrin [Bupropion] Hives, Itching and Other (See Comments)    Skin crawling sensation    Patient Measurements: Height: 5' 7 (170.2 cm) Weight: 107.6 kg (237 lb 3.4 oz) IBW/kg (Calculated) : 61.6 HEPARIN  DW (KG): 86.4  Vital Signs: Temp: 99.9 F (37.7 C) (07/04 0500) Pulse Rate: 70 (07/04 0700)  Labs: Recent Labs    10/24/23 0440 10/24/23 0442 10/24/23 1011 10/24/23 1152 10/24/23 1610 10/24/23 1616 10/25/23 0453 10/25/23 0601 10/25/23 1647 10/25/23 1704 10/25/23 2247 10/26/23 0123 10/26/23 0215 10/26/23 0445 10/26/23 0450  HGB 8.3*   < > 8.5*   < > 8.7*   < > 8.8*   < > 8.8*   < > 8.7* 9.2* 9.5*  --  9.2*  HCT 26.9*   < > 27.3*   < > 28.5*   < > 28.8*   < > 28.5*   < > 28.1* 27.0* 28.0*  --  29.8*  PLT 151  --   --   --  139*  --  157  --   --   --   --   --   --   --  111*  APTT 72*  --  63*  --  50*  --   --   --   --   --   --   --   --   --   --   HEPARINUNFRC  --   --   --   --   --    < >  --    < > 0.37  --  0.44  --   --  0.34  --   CREATININE 0.84  --   --   --  0.96  --  1.09*  --  0.88  --   --   --   --   --  0.80   < > = values in this interval not displayed.    Estimated Creatinine Clearance: 108.6 mL/min (by C-G formula based on SCr of 0.8 mg/dL).  Assessment: 31 YOF admitted with submassive PE with right heart strain. RV/LV ration 1.4. CT with bilateral PE with severe clot burden. Patient not on anticoagulation prior to hospitalization. Patient also has ongoing vaginal bleeding due to uterine fibroids, noted to be a chronic issue. Pharmacy initially consulted for heparin  dosing. Patient later decompensated, started on ECMO on 6/28 PM.  6/28 PM VA ECMO cannulation + catheter directed alteplase  + uterine artery embolization (hx uterine fibroids); heparin   overnight, switched to bival 6/29 AM 7/2 VA ECMO decannulation - started on heparin  @1900   Heparin  level 0.34 is therapeutic on 1950 units/hr.  No new s/sx bleeding, ongoing vaginal bleeding (stable - 1-2 pad changes/day).  Hgb 9.2 (up), pltc 111 this morning.  Goal of Therapy:  Heparin  level 0.3-0.7 units/ml Monitor platelets by anticoagulation protocol: Yes   Plan:  Continue heparin  infusion 1950 units/hr  Daily heparin  level, CBC Monitor CBC and s/sx of bleeding daily  Maurilio Fila, PharmD Clinical Pharmacist 10/26/2023  7:46 AM

## 2023-10-26 NOTE — Plan of Care (Signed)
  Problem: Elimination: Goal: Will not experience complications related to bowel motility Outcome: Progressing Goal: Will not experience complications related to urinary retention Outcome: Progressing   Problem: Skin Integrity: Goal: Risk for impaired skin integrity will decrease Outcome: Progressing   Problem: Metabolic: Goal: Ability to maintain appropriate glucose levels will improve Outcome: Progressing

## 2023-10-26 NOTE — Progress Notes (Signed)
 Nutrition Follow-up  DOCUMENTATION CODES:   Not applicable  INTERVENTION:   Tube Feeding via Cortrak:  Vital 1.5 at 55 ml/hr Increase TF to rate of 35 ml/hr, titrate by 10 mL q 8 hours until goal rate of 55 ml/hr Pro-source TF20 60 mL daily TF at goal provides 2060 kcals, 109 g of protein and 1003 mL of free water   Recommend increasing free water  to 150 mL q 4 hours; provides 900 mL of free water .   Add Banatrol TF BID via tube- each packet provides 5g soluble fiber    NUTRITION DIAGNOSIS:   Inadequate oral intake related to inability to eat as evidenced by NPO status.  Being addressed via TF  GOAL:   Patient will meet greater than or equal to 90% of their needs  Progressing  MONITOR:   Vent status, TF tolerance, I & O's, Skin, Labs  REASON FOR ASSESSMENT:   Consult Enteral/tube feeding initiation and management  ASSESSMENT:   Pt with PMH significant for: ADD, arthritis, HTN, hypothyroidism, cystic mastopathy, mastitis and bleeding of uterine fibroids. Presented to [facility] with c/o worsening SOB and chest pain x2 days. Found to have pulmonary embolism w/ R heart strain. Not a good candidate for clot extraction. Transferred to [facility] and underwent VA ECMO and catheter directed thrombolysis.  6/28 transferred to [facility], 3 units PRBCs transfused; ECMO initiated 6/29 intubated; TFs initiated 6/30 still advancing TFs, tPA completed and catheter removed; ECHO: LV EF 40-45%; RV systolic function severely reduced 7/01 NPO r/t vomiting and c/f aspiration 7/02 ECMO decannulation 7/03 Cortrak placed; trickles re-initiated  7/04 Off pressors, off milrinone   Pt remains on vent support, down to 50% FiO2, PEEP 10  Tolerating trickle TF; discussed plan with Dr. Harold and ok to begin titration to goal rate  +large type 7 BM today, FMS inserted. 4BMs overnight. Previously experiencing constipation since admission. Received SMOG enema yesterday. Plan to add soluble fiber today  Admit  weight: 106.2kg  Current weight: 107.6 kg-  Hypernatremia worsening. Current free water  flush of 50 mL q 4 hours. TF providing minimal free water  at trickle rate CVP currently down to 4-5 but edema remains present UOP 4L in 24 hours, on lasix  gtt  Labs: Sodium 151 (H) CL 110 (wdl) Potassium 3.3 (L) BUN 21 Creatinine wdl Phosphorus 3.5 (wdl) Magnesium  2.4 (wdl) CBGs 114-142  Meds: Megace  (per OB/Gyn) SS novolog  Lasix  gtt KCL (received lots of runs of potasium in 24 hours)   Diet Order:   Diet Order             Diet NPO time specified  Diet effective midnight                   EDUCATION NEEDS:   Not appropriate for education at this time  Skin:  Skin Assessment: Skin Integrity Issues: Skin Integrity Issues:: Incisions Incisions: Groin-closed, wound VAC in place  Last BM:  7/4 large type 7, FMS inserted  Height:   Ht Readings from Last 1 Encounters:  10/25/23 5' 7 (1.702 m)    Weight:   Wt Readings from Last 1 Encounters:  10/26/23 107.6 kg    Ideal Body Weight:  61.4 kg  BMI:  Body mass index is 37.15 kg/m.  Estimated Nutritional Needs:   Kcal:  1850-2050 kcals  Protein:  95-110g  Fluid:  >1.9L/day   Betsey Finger MS, RDN, LDN, CNSC Registered Dietitian 3 Clinical Nutrition RD Inpatient Contact Info in Amion

## 2023-10-26 NOTE — Progress Notes (Signed)
 Hydromorphone  sent from pharmacy, wasted 50mL 10/26/23 @ 0017 with Dorlene Gains RN as witness.

## 2023-10-26 NOTE — Progress Notes (Signed)
 [facility] ADULT ICU REPLACEMENT PROTOCOL   The patient does apply for the [facility] Adult ICU Electrolyte Replacment Protocol based on the criteria listed below:   1.Exclusion criteria: TCTS, ECMO, Dialysis, and Myasthenia Gravis patients 2. Is GFR >/= 30 ml/min? Yes.    Patient's GFR today is >60 3. Is SCr </= 2? Yes.   Patient's SCr is 0.8 mg/dL 4. Did SCr increase >/= 0.5 in 24 hours? No. 5.Pt's weight >40kg  Yes.   6. Abnormal electrolyte(s): k 3.3  7. Electrolytes replaced per protocol 8.  Call MD STAT for K+ </= 2.5, Phos </= 1, or Mag </= 1 Physician:    Taelyn Nemes A 10/26/2023 5:59 AM

## 2023-10-26 NOTE — Progress Notes (Signed)
 NAME:  Anita Aguirre, MRN:  [id number], DOB:  14-Jan-1976, LOS: 6 ADMISSION DATE:  10/20/2023, CONSULTATION DATE:  10/20/2023 REFERRING MD:  Dr Isadora, CHIEF COMPLAINT:  SOB/Chest pain  History of Present Illness:  48 y/o female with PMH for ADD (attention deficit disorder), Arthritis (04/24/2008), Diffuse cystic mastopathy (04/24/2010), Hypertension, Hypothyroidism,Mastitis and bleeding uterine fibroids who presented to [facility] with worsening SOB and chest pain for last 2 days worsening day of admission to hospital  Chest pain dull and not radiating.  She was found to have submassive Pulmonary embolism with right heart strain.  Her Troponin increased to 409, Lactic Acid down to 2.7 from 3.9. CT scan chest revealed, Bilateral pulmonary emboli with severe clot burden. 2. CT evidence of RIGHT ventricular strain. Positive for acute PE with CT evidence of right heart strain (RV/LV Ratio = 1.4) consistent with at least submassive (intermediate risk) PE. The presence of right heart strain has been associated with an increased risk of morbidity and mortality. Please refer to the Code PE Focused order set in EPIC. 3. Small pulmonary infarction in the RIGHT lower lobe. 4. Moderate hiatal hernia.  Patient says she has no previous h/o blood clots but does have a h/o spontaneous abortion.  She works from home and has a sit down job and her life style is very sedentary.  No family h/o Pulmonary emboli. At [facility] she was started on Heparin  drip and Milrinone  drip.  She is feeling better, currently not on pressors.  Case was d/w IR by Dr Isadora and she was not a good candidate for clot extraction but maybe a better case for catheter directed Thrombolysis despite have continually bleeding for several years, She says she has been having uterine bleeding since 2013 when she had a miscarriage.  Her uterine bleeding apparently from Fibroids has worsened over the last several months and she was being  considered for Hysterectomy at [facility] ([place]) but has not made it there yet. No N/V/D, no Fever/chills, no recent flights or leg trauma.  She did c/o left calf pain over the last few days and increase size of left ankle. Denies LOC. Pertinent  Medical History  ADD (attention deficit disorder), Arthritis (04/24/2008), Diffuse cystic mastopathy (04/24/2010), Hypertension, Hypothyroidism, and Mastitis.   Significant Hospital Events: Including procedures, antibiotic start and stop dates in addition to other pertinent events   6/28: transfer from [place].  Underwent [facility] ECMO and catheter directed thrombolysis 6/29 remain on VA ECMO, lysis catheter removed by IR.  Continue to have vaginal bleeding/clots 6/30 ECMO flow was decreased to 3 L, patient tolerated well, bedside echocardiogram showed mildly reduced RV function compared to prior.  Continued to have vaginal bleeding/clots 7/2 ECMO decannulated 7/4 milrinone  was stopped, off vasopressor support  Interim History / Subjective:  Milrinone  was stopped Off vasopressor support Continue Lasix  infusion as she looks volume overloaded Remained afebrile  Objective    Blood pressure (!) 101/56, pulse 70, temperature 99.9 F (37.7 C), resp. rate 20, height 5' 7 (1.702 m), weight 107.6 kg, SpO2 98%. PAP: (26-51)/(18-37) 41/18 CVP:  [7 mmHg-17 mmHg] 12 mmHg CO:  [5 L/min] 5 L/min CI:  [2.54 L/min/m2] 2.54 L/min/m2  Vent Mode: PRVC FiO2 (%):  [50 %-80 %] 60 % Set Rate:  [20 bmp] 20 bmp Vt Set:  [490 mL] 490 mL PEEP:  [10 cmH20] 10 cmH20 Plateau Pressure:  [20 cmH20-24 cmH20] 20 cmH20   Intake/Output Summary (Last 24 hours) at 10/26/2023 0856 Last data filed at 10/26/2023 0700  Gross per 24 hour  Intake 2265.36 ml  Output 3560 ml  Net -1294.64 ml   Filed Weights   10/25/23 0300 10/25/23 0415 10/26/23 0457  Weight: 108.3 kg 110.8 kg 107.6 kg    Examination: General: Crtitically ill-appearing obese female, orally intubated HEENT: [place]/AT, eyes  anicteric.  ETT and cortrak in place Neuro: Trying to open eyes, not following commands Chest: Reduced air entry at the bases bilaterally, no wheezes or rhonchi Heart: Regular rate and rhythm, no murmurs or gallops Abdomen: Soft, nondistended, bowel sounds present Extremities: 2+ pitting edema noted  Labs and images reviewed  Patient Lines/Drains/Airways Status     Active Line/Drains/Airways     Name Placement date Placement time Site Days   Arterial Line 10/20/23 Right Radial 10/20/23  1824  Radial  6   CVC Triple Lumen 10/20/23 Right Internal jugular 10/20/23  0600  -- 6   Peripheral IV (Ped) 10/19/23 20 G Hand 10/19/23  1637  -- 7   Negative Pressure Wound Therapy Groin Left;Anterior 10/24/23  1506  --  2   Urethral Catheter Lauraine Letters, RN Temperature probe 14 Fr. 10/19/23  2230  Temperature probe  7   Fecal Management System 30 mL 10/26/23  0330  -- less than 1   Airway 7.5 mm 10/20/23  1804  -- 6   Pulmonary Artery Catheter Left --  --  -- --   Small Bore Feeding Tube Left nare Marking at nare/corner of mouth 70 cm 10/25/23  0924  Left nare  1   Wound 10/24/23 1459 Surgical Closed Surgical Incision Groin Left 10/24/23  1459  Groin  2        Resolved problem list  Lactic acidosis SIRS response from ECMO cannulation, improved Acute kidney injury due to cardiorenal syndrome, improving hypophosphatemia Constipation Intolerant of tube feeds  Assessment and Plan  Acute bilateral submassive PE with right heart strain status post catheter directed thrombolysis Acute RV failure with cardiogenic shock status post VA ECMO on 6/28 -7/2 Acute respiratory failure with hypoxia Right upper lobe lung infarction Vaginal bleeding status post uterine artery embolization, continue to bleed Acute blood loss anemia vaginal bleeding Hypothyroidism Obesity Hypokalemia Hypernatremia  Continue IV heparin  infusion Milrinone  was stopped by advanced heart failure team this  morning Continue Lasix  infusion due to volume overload Continue lung protective ventilation FiO2 was titrated up to 80% last night, currently back to 50%, she is tolerating spontaneous breathing trial on 50% FiO2 8/8 Vaginal bleeding has stopped Continue Megace  twice daily per OB/GYN recommendations H&H is stable Vaginal bleeding has improved Continue levothyroxine  Continue aggressive electrolyte replacement Started on free water  flushes Constipation has resolved, patient had multiple bowel movements overnight   Best Practice (right click and Reselect all SmartList Selections daily)   Diet/type: Advance tube feeds DVT prophylaxis systemic heparin  Pressure ulcer(s): Deferred to nursing notes GI prophylaxis: H2 blocker Lines: Central line, arterial line, still needed Foley: Yes, still needed Code Status:  full code. Last date of goals of care discussion: 6/28 patient and her mother were updated at bedside, decision was to continue full scope of care   Labs   CBC: Recent Labs  Lab 10/23/23 1114 10/23/23 1115 10/24/23 0440 10/24/23 0442 10/24/23 1610 10/24/23 1616 10/25/23 0453 10/25/23 0601 10/25/23 1704 10/25/23 2247 10/26/23 0123 10/26/23 0215 10/26/23 0450  WBC 20.5*  --  19.8*  --  20.8*  --  19.1*  --   --   --   --   --  18.3*  HGB 8.2*   < > 8.3*   < > 8.7*   < > 8.8*   < > 8.5* 8.7* 9.2* 9.5* 9.2*  HCT 26.4*   < > 26.9*   < > 28.5*   < > 28.8*   < > 25.0* 28.1* 27.0* 28.0* 29.8*  MCV 86.6  --  87.1  --  88.2  --  89.2  --   --   --   --   --  86.4  PLT 149*  --  151  --  139*  --  157  --   --   --   --   --  111*   < > = values in this interval not displayed.    Basic Metabolic Panel: Recent Labs  Lab 10/22/23 0410 10/22/23 0413 10/22/23 1618 10/22/23 1630 10/23/23 2330 10/24/23 0039 10/24/23 0440 10/24/23 0442 10/24/23 1610 10/24/23 1616 10/25/23 0451 10/25/23 0453 10/25/23 0601 10/25/23 1647 10/25/23 1704 10/26/23 0123 10/26/23 0215  10/26/23 0450  NA 147*   < > 144   < >  --    < > 147*   < > 149*   < >  --  147*   < > 149* 149* 151* 151* 151*  K 3.7   < > 4.2   < >  --    < > 3.7   < > 3.6   < >  --  3.8   < > 3.3* 3.2* 3.5 3.5 3.3*  CL 112*  --  108   < >  --   --  109  --  109  --   --  109  --  110  --   --   --  110  CO2 25  --  25   < >  --   --  27  --  29  --   --  28  --  30  --   --   --  30  GLUCOSE 116*  --  96   < >  --   --  131*  --  135*  --   --  117*  --  138*  --   --   --  109*  BUN 9  --  11   < >  --   --  15  --  17  --   --  20  --  18  --   --   --  21*  CREATININE 0.92  --  0.90   < >  --   --  0.84  --  0.96  --   --  1.09*  --  0.88  --   --   --  0.80  CALCIUM  7.5*  --  8.0*   < >  --   --  9.0  --  8.7*  --   --  8.9  --  8.9  --   --   --  9.4  MG 2.1  --  2.1   < >  --   --  2.4  --  2.4  --   --  2.5*  --  2.4  --   --   --  2.4  PHOS 2.3*  --  4.4  --  3.3  --   --   --   --   --  5.2*  --   --   --   --   --   --  3.5   < > = values in this interval not displayed.   GFR: Estimated Creatinine Clearance: 108.6 mL/min (by C-G formula based on SCr of 0.8 mg/dL). Recent Labs  Lab 10/19/23 1559 10/19/23 1639 10/23/23 0950 10/23/23 1114 10/24/23 0440 10/24/23 0631 10/24/23 1610 10/24/23 1626 10/24/23 1907 10/25/23 0453 10/26/23 0450  PROCALCITON <0.10  --   --   --   --   --   --   --   --   --   --   WBC 15.2*   < >  --    < > 19.8*  --  20.8*  --   --  19.1* 18.3*  LATICACIDVEN  --    < > 1.9  --   --  1.0  --  0.7 0.6  --   --    < > = values in this interval not displayed.    Liver Function Tests: Recent Labs  Lab 10/19/23 1559 10/20/23 1525 10/20/23 2344  AST 28  --  18  ALT 12  --  9  ALKPHOS 22*  --  14*  BILITOT 0.4  --  1.2  PROT 7.3  --  5.1*  ALBUMIN  3.3* 3.1* 2.8*   No results for input(s): LIPASE, AMYLASE in the last 168 hours. No results for input(s): AMMONIA in the last 168 hours.  ABG    Component Value Date/Time   PHART 7.512 (H) 10/26/2023  0215   PCO2ART 39.2 10/26/2023 0215   PO2ART 86 10/26/2023 0215   HCO3 31.4 (H) 10/26/2023 0215   TCO2 33 (H) 10/26/2023 0215   ACIDBASEDEF 2.0 10/21/2023 1813   O2SAT 65.3 10/26/2023 0445     Coagulation Profile: Recent Labs  Lab 10/19/23 1710 10/20/23 2344  INR 1.0 1.3*    Cardiac Enzymes: No results for input(s): CKTOTAL, CKMB, CKMBINDEX, TROPONINI in the last 168 hours.  HbA1C: Hgb A1c MFr Bld  Date/Time Value Ref Range Status  10/20/2023 06:43 AM 5.0 4.8 - 5.6 % Final    Comment:    (NOTE) Diagnosis of Diabetes The following HbA1c ranges recommended by the American Diabetes Association (ADA) may be used as an aid in the diagnosis of diabetes mellitus.  Hemoglobin             Suggested A1C NGSP%              Diagnosis  <5.7                   Non Diabetic  5.7-6.4                Pre-Diabetic  >6.4                   Diabetic  <7.0                   Glycemic control for                       adults with diabetes.      CBG: Recent Labs  Lab 10/25/23 1649 10/25/23 1919 10/25/23 2324 10/26/23 0333 10/26/23 0742  GLUCAP 144* 142* 121* 126* 114*    The patient is critically ill due to acute bilateral submassive PE/acute respiratory failure/cardiogenic shock status post VA ECMO.  Critical care was necessary to treat or prevent imminent or life-threatening deterioration.  Critical care was time spent personally by me on the following activities: development of treatment plan with patient and/or  surrogate as well as nursing, discussions with consultants, evaluation of patient's response to treatment, examination of patient, obtaining history from patient or surrogate, ordering and performing treatments and interventions, ordering and review of laboratory studies, ordering and review of radiographic studies, pulse oximetry, re-evaluation of patient's condition and participation in multidisciplinary rounds.   During this encounter critical care time was  devoted to patient care services described in this note for 37 minutes.   Valinda Novas, MD [facility] See Amion for pager If no response to pager, please call [phone] until 7pm After 7pm, Please call E-link [phone]

## 2023-10-26 NOTE — Progress Notes (Signed)
 Patient ID: Anita Aguirre, female   DOB: [birth year], 48 y.o.   MRN: [id number]     Advanced Heart Failure Rounding Note  Cardiologist: None  Chief Complaint: PE Subjective:    VA ECMO decannulated 7/2.   No pressors, stable MAP.  Remains on milrinone  0.125. Good diuresis yesterday on Lasix  gtt 6 mg/hr with I/Os net negative 1464, creatinine stable 0.8 with Na higher at 151.   Hgb stable 9.2, vaginal oozing seems to have stopped.   Now off abx.   LE dopplers showed left-sided DVT.  She is now on heparin  gtt.   Swan: PA 37/14 RA 8 CI 3.44 Co-ox 65%  Objective:   Weight Range: 107.6 kg Body mass index is 37.15 kg/m.   Vital Signs:   Temp:  [97.5 F (36.4 C)-100 F (37.8 C)] 99.9 F (37.7 C) (07/04 0500) Pulse Rate:  [57-96] 70 (07/04 0700) Resp:  [20] 20 (07/04 0000) SpO2:  [88 %-100 %] 98 % (07/04 0700) Arterial Line BP: (86-190)/(48-100) 122/61 (07/04 0700) FiO2 (%):  [50 %-80 %] 60 % (07/04 0803) Weight:  [107.6 kg] 107.6 kg (07/04 0457) Last BM Date : 10/19/23  Weight change: Filed Weights   10/25/23 0300 10/25/23 0415 10/26/23 0457  Weight: 108.3 kg 110.8 kg 107.6 kg    Intake/Output:   Intake/Output Summary (Last 24 hours) at 10/26/2023 0844 Last data filed at 10/26/2023 0700 Gross per 24 hour  Intake 2265.36 ml  Output 3710 ml  Net -1444.64 ml      Physical Exam    General: Sedated on vent.  Neck: JVP 8 cm, no thyromegaly or thyroid nodule.  Lungs: Clear to auscultation bilaterally with normal respiratory effort. CV: Nondisplaced PMI.  Heart regular S1/S2, no S3/S4, no murmur. 1+ ankle edema.  Abdomen: Soft, nontender, no hepatosplenomegaly, no distention.  Skin: Intact without lesions or rashes.  Neurologic: Sedated on vent.  Extremities: No clubbing or cyanosis.  HEENT: Normal.   Telemetry   NSR (personally reviewed)  Labs    CBC Recent Labs    10/25/23 0453 10/25/23 0601 10/26/23 0215 10/26/23 0450  WBC 19.1*  --   --  18.3*  HGB  8.8*   < > 9.5* 9.2*  HCT 28.8*   < > 28.0* 29.8*  MCV 89.2  --   --  86.4  PLT 157  --   --  111*   < > = values in this interval not displayed.   Basic Metabolic Panel Recent Labs    92/96/74 0451 10/25/23 0453 10/25/23 1647 10/25/23 1704 10/26/23 0215 10/26/23 0450  NA  --    < > 149*   < > 151* 151*  K  --    < > 3.3*   < > 3.5 3.3*  CL  --    < > 110  --   --  110  CO2  --    < > 30  --   --  30  GLUCOSE  --    < > 138*  --   --  109*  BUN  --    < > 18  --   --  21*  CREATININE  --    < > 0.88  --   --  0.80  CALCIUM   --    < > 8.9  --   --  9.4  MG  --    < > 2.4  --   --  2.4  PHOS 5.2*  --   --   --   --  3.5   < > = values in this interval not displayed.   Liver Function Tests No results for input(s): AST, ALT, ALKPHOS, BILITOT, PROT, ALBUMIN  in the last 72 hours.  No results for input(s): LIPASE, AMYLASE in the last 72 hours. Cardiac Enzymes No results for input(s): CKTOTAL, CKMB, CKMBINDEX, TROPONINI in the last 72 hours.  BNP: BNP (last 3 results) Recent Labs    10/20/23 0643  BNP 308.4*    ProBNP (last 3 results) No results for input(s): PROBNP in the last 8760 hours.   D-Dimer No results for input(s): DDIMER in the last 72 hours. Hemoglobin A1C No results for input(s): HGBA1C in the last 72 hours.  Fasting Lipid Panel No results for input(s): CHOL, HDL, LDLCALC, TRIG, CHOLHDL, LDLDIRECT in the last 72 hours. Thyroid Function Tests No results for input(s): TSH, T4TOTAL, T3FREE, THYROIDAB in the last 72 hours.  Invalid input(s): FREET3  Other results:   Imaging    DG CHEST PORT 1 VIEW Result Date: 10/26/2023 CLINICAL DATA:  Syncope and collapse EXAM: PORTABLE CHEST 1 VIEW COMPARISON:  10/25/2023 FINDINGS: Endotracheal tube tip in the intrathoracic trachea 3.3 cm from the carina. Left IJ Swan-Ganz catheter tip has been retracted and is now in appropriate position in the right pulmonary  artery. Right IJ CVC tip in the right atrium. Enteric tube tip in the stomach. Low lung volumes. Perihilar predominant interstitial and airspace opacity suspicious for edema though these are exacerbated by low lung volumes. Probable small left pleural effusion. No pneumothorax. No displaced rib fractures. IMPRESSION: 1. Perihilar predominant interstitial and airspace opacity suspicious for edema though these are exacerbated by low lung volumes. 2. Support apparatus as above. Electronically Signed   By: Norman Gatlin M.D.   On: 10/26/2023 01:04   DG Abd Portable 1V Result Date: 10/25/2023 CLINICAL DATA:  [id number] Encounter for feeding tube placement [phone]. EXAM: PORTABLE ABDOMEN - 1 VIEW COMPARISON:  10/24/2023. FINDINGS: The bowel gas pattern is non-obstructive. There is paucity of bowel gas. No evidence of pneumoperitoneum, within the limitations of a supine film. No acute osseous abnormalities. The soft tissues are within normal limits. Surgical changes, devices, tubes and lines: Weighted Dobbhoff tube noted with its tip overlying the right paramedian upper abdomen, overlying the region of distal body/pylorus. IMPRESSION: *Weighted Dobbhoff tube noted with its tip overlying the right paramedian upper abdomen, overlying the region of distal body/pylorus. Electronically Signed   By: Ree Molt M.D.   On: 10/25/2023 09:44     Medications:     Scheduled Medications:  Chlorhexidine  Gluconate Cloth  6 each Topical Daily   famotidine   20 mg Per Tube BID   feeding supplement (PROSource TF20)  60 mL Per Tube Daily   free water   50 mL Per Tube Q4H   insulin  aspart  0-20 Units Subcutaneous Q4H   levothyroxine   88 mcg Per Tube Q0600   megestrol   40 mg Per Tube BID   mouth rinse  15 mL Mouth Rinse Q2H   potassium chloride   20 mEq Per Tube Q4H   sodium chloride  flush  3 mL Intravenous Q12H   thiamine   100 mg Per Tube Daily    Infusions:  albumin  human Stopped (10/22/23 1227)   dexmedetomidine   (PRECEDEX ) IV infusion 0.2 mcg/kg/hr (10/26/23 0700)   feeding supplement (VITAL 1.5 CAL) 20 mL/hr at 10/26/23 0700   furosemide  (LASIX ) 200 mg in dextrose  5 % 100 mL (2 mg/mL) infusion 6 mg/hr (10/26/23 0700)   heparin  1,950 Units/hr (10/26/23  0700)   norepinephrine  (LEVOPHED ) Adult infusion Stopped (10/26/23 0230)   potassium chloride  10 mEq (10/26/23 0740)    PRN Medications: albumin  human, albuterol , docusate sodium , iohexol , midazolam , ondansetron  (ZOFRAN ) IV, mouth rinse, mouth rinse, polyethylene glycol, sodium chloride  flush    Assessment/Plan   1. 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 CT chest with on 10/19/23 with bilateral pulmonary emboli with severe clot burden; clot is in the segmental and distal branches, not amenable to thrombectomy.  Cannulated for V-A ECMO fem-fem on 10/20/23 for RV failure. RV on 6/28 with LV EF 65-70%, D septum, RV function moderately reduced.  Most recent echo 7/1 with LV EF normal, RV mildly dysfunctional and mildly dilated, IVC small. VA ECMO decannulated 7/2.  BP stable off pressors.  Today, PA pressure stable 37/14, RA 8 with good diuresis yesterday on Lasix  6 mg/hr.  CI and co-ox excellent.  - Stop milrinone  today.  If PA pressure remains stable, can d/c Swan.  - Decrease Lasix  gtt to 3 mg/hr.   2. Acute PE: With RV failure. Bilateral, received TPA to each PA by IR. Has left leg DVT.  - Continue heparin  gtt.  3. Uterine fibroids with bleeding: Now s/p uterine artery embolization 10/20/23. Still with oozing and clots. Hgb 9.2 today.  - Follow CBC, transfuse hgb < 8.   4. Acute hypoxemic respiratory failure:  FiO2 still 0.6.   - Vent per CCM. 5. ID: She was treated with vancomycin /meropenem , has completed course.   6. Hypernatremia: Decreasing Lasix  gtt today, repeat BMET in pm.   CRITICAL CARE Performed by: Ezra Shuck  Total critical care time: 40 minutes  Critical care time was exclusive of separately billable procedures and  treating other patients.  Critical care was necessary to treat or prevent imminent or life-threatening deterioration.  Critical care was time spent personally by me on the following activities: development of treatment plan with patient and/or surrogate as well as nursing, discussions with consultants, evaluation of patient's response to treatment, examination of patient, obtaining history from patient or surrogate, ordering and performing treatments and interventions, ordering and review of laboratory studies, ordering and review of radiographic studies, pulse oximetry and re-evaluation of patient's condition.   Length of Stay: 6  Ezra Shuck, MD  10/26/2023, 8:44 AM  Advanced Heart Failure Team Pager [phone] (M-F; 7a - 5p)  Please contact [facility] for night-coverage after hours (5p -7a ) and weekends on [email or web address]

## 2023-10-27 ENCOUNTER — Inpatient Hospital Stay (HOSPITAL_COMMUNITY): Payer: Self-pay

## 2023-10-27 LAB — BASIC METABOLIC PANEL WITH GFR
Anion gap: 13 (ref 5–15)
Anion gap: 10 (ref 5–15)
BUN: 22 mg/dL — ABNORMAL HIGH (ref 6–20)
BUN: 26 mg/dL — ABNORMAL HIGH (ref 6–20)
CO2: 26 mmol/L (ref 22–32)
CO2: 26 mmol/L (ref 22–32)
Calcium: 9.4 mg/dL (ref 8.9–10.3)
Calcium: 9.1 mg/dL (ref 8.9–10.3)
Chloride: 107 mmol/L (ref 98–111)
Chloride: 112 mmol/L — ABNORMAL HIGH (ref 98–111)
Creatinine, Ser: 0.97 mg/dL (ref 0.44–1.00)
Creatinine, Ser: 1.01 mg/dL — ABNORMAL HIGH (ref 0.44–1.00)
GFR, Estimated: >60 mL/min (ref >60)
GFR, Estimated: >60 mL/min (ref >60)
Glucose, Bld: 134 mg/dL — ABNORMAL HIGH (ref 70–99)
Glucose, Bld: 136 mg/dL — ABNORMAL HIGH (ref 70–99)
Potassium: 3.2 mmol/L — ABNORMAL LOW (ref 3.5–5.1)
Potassium: 4.4 mmol/L (ref 3.5–5.1)
Sodium: 146 mmol/L — ABNORMAL HIGH (ref 135–145)
Sodium: 148 mmol/L — ABNORMAL HIGH (ref 135–145)

## 2023-10-27 LAB — HEMOGLOBIN AND HEMATOCRIT, BLOOD
HCT: 33.8 % — ABNORMAL LOW (ref 36.0–46.0)
HCT: 34 % — ABNORMAL LOW (ref 36.0–46.0)
HCT: 31.9 % — ABNORMAL LOW (ref 36.0–46.0)
Hemoglobin: 10.3 g/dL — ABNORMAL LOW (ref 12.0–15.0)
Hemoglobin: 10.3 g/dL — ABNORMAL LOW (ref 12.0–15.0)
Hemoglobin: 9.5 g/dL — ABNORMAL LOW (ref 12.0–15.0)

## 2023-10-27 LAB — GLUCOSE, CAPILLARY
Glucose-Capillary: 125 mg/dL — ABNORMAL HIGH (ref 70–99)
Glucose-Capillary: 130 mg/dL — ABNORMAL HIGH (ref 70–99)
Glucose-Capillary: 131 mg/dL — ABNORMAL HIGH (ref 70–99)
Glucose-Capillary: 134 mg/dL — ABNORMAL HIGH (ref 70–99)
Glucose-Capillary: 135 mg/dL — ABNORMAL HIGH (ref 70–99)
Glucose-Capillary: 135 mg/dL — ABNORMAL HIGH (ref 70–99)
Glucose-Capillary: 174 mg/dL — ABNORMAL HIGH (ref 70–99)
Glucose-Capillary: 186 mg/dL — ABNORMAL HIGH (ref 70–99)

## 2023-10-27 LAB — CBC
HCT: 32.5 % — ABNORMAL LOW (ref 36.0–46.0)
Hemoglobin: 10 g/dL — ABNORMAL LOW (ref 12.0–15.0)
MCH: 26.7 pg (ref 26.0–34.0)
MCHC: 30.8 g/dL (ref 30.0–36.0)
MCV: 86.9 fL (ref 80.0–100.0)
Platelets: 59 K/uL — ABNORMAL LOW (ref 150–400)
RBC: 3.74 MIL/uL — ABNORMAL LOW (ref 3.87–5.11)
RDW: 19.2 % — ABNORMAL HIGH (ref 11.5–15.5)
WBC: 20.6 K/uL — ABNORMAL HIGH (ref 4.0–10.5)
nRBC: 0.8 % — ABNORMAL HIGH (ref 0.0–0.2)

## 2023-10-27 LAB — POCT ACTIVATED CLOTTING TIME: Activated Clotting Time: 302 s

## 2023-10-27 LAB — APTT
aPTT: 44 s — ABNORMAL HIGH (ref 24–36)
aPTT: 43 s — ABNORMAL HIGH (ref 24–36)

## 2023-10-27 LAB — MAGNESIUM: Magnesium: 2.4 mg/dL (ref 1.7–2.4)

## 2023-10-27 LAB — PHOSPHORUS: Phosphorus: 4.6 mg/dL (ref 2.5–4.6)

## 2023-10-27 LAB — HEPARIN LEVEL (UNFRACTIONATED): Heparin Unfractionated: 0.39 [IU]/mL (ref 0.30–0.70)

## 2023-10-27 MED ORDER — VANCOMYCIN HCL 750 MG/150ML IV SOLN
750.0000 mg | Freq: Two times a day (BID) | INTRAVENOUS | Status: DC
  Administered 2023-10-27: 750 mg via INTRAVENOUS
  Filled 2023-10-27 (×2): qty 150

## 2023-10-27 MED ORDER — FENTANYL CITRATE PF 50 MCG/ML IJ SOSY
50.0000 ug | PREFILLED_SYRINGE | INTRAMUSCULAR | Status: DC | PRN
  Administered 2023-10-27: 50 ug via INTRAVENOUS
  Filled 2023-10-27 (×2): qty 1

## 2023-10-27 MED ORDER — SODIUM CHLORIDE 0.9% FLUSH
3.0000 mL | INTRAVENOUS | Status: DC | PRN
  Administered 2023-10-27: 3 mL via INTRAVENOUS

## 2023-10-27 MED ORDER — ARGATROBAN 50 MG/50ML IV SOLN
0.5000 ug/kg/min | INTRAVENOUS | Status: DC
  Filled 2023-10-27: qty 50

## 2023-10-27 MED ORDER — SODIUM CHLORIDE 0.9 % IV SOLN
0.0950 mg/kg/h | INTRAVENOUS | Status: DC
  Administered 2023-10-27 (×2): 0.045 mg/kg/h via INTRAVENOUS
  Administered 2023-10-28: 0.09 mg/kg/h via INTRAVENOUS
  Administered 2023-10-29: 0.095 mg/kg/h via INTRAVENOUS
  Filled 2023-10-27 (×5): qty 250

## 2023-10-27 MED ORDER — SODIUM CHLORIDE 0.9% FLUSH
3.0000 mL | Freq: Two times a day (BID) | INTRAVENOUS | Status: DC
Start: 2023-10-27 — End: 2023-11-04
  Administered 2023-10-27 – 2023-11-04 (×14): 3 mL via INTRAVENOUS

## 2023-10-27 MED ORDER — SODIUM CHLORIDE 0.9 % IV SOLN: 250.0000 mL | INTRAVENOUS | Status: AC | PRN

## 2023-10-27 MED ORDER — SODIUM CHLORIDE 0.9 % IV SOLN
2.0000 g | Freq: Three times a day (TID) | INTRAVENOUS | Status: DC
  Administered 2023-10-27 – 2023-10-29 (×5): 2 g via INTRAVENOUS
  Filled 2023-10-27 (×5): qty 12.5

## 2023-10-27 MED ORDER — POTASSIUM CHLORIDE 20 MEQ PO PACK
20.0000 meq | PACK | ORAL | Status: AC
  Administered 2023-10-27 (×2): 20 meq
  Filled 2023-10-27 (×2): qty 1

## 2023-10-27 MED ORDER — LABETALOL HCL 5 MG/ML IV SOLN: 10.0000 mg | Freq: Four times a day (QID) | INTRAVENOUS | Status: DC | PRN

## 2023-10-27 MED ORDER — POTASSIUM CHLORIDE 10 MEQ/50ML IV SOLN
10.0000 meq | INTRAVENOUS | Status: AC
  Administered 2023-10-27 (×4): 10 meq via INTRAVENOUS
  Filled 2023-10-27 (×3): qty 50

## 2023-10-27 MED ORDER — FENTANYL CITRATE PF 50 MCG/ML IJ SOSY
50.0000 ug | PREFILLED_SYRINGE | INTRAMUSCULAR | Status: DC | PRN
  Administered 2023-10-27: 50 ug via INTRAVENOUS
  Administered 2023-10-27 – 2023-10-28 (×2): 100 ug via INTRAVENOUS
  Filled 2023-10-27 (×2): qty 2

## 2023-10-27 NOTE — Progress Notes (Signed)
 eLink Physician-Brief Progress Note Patient Name: Anita Aguirre DOB: 1975/11/21 MRN: [id number]   Date of Service  10/27/2023  HPI/Events of Note  Patient is now more tachycardic and his breath stacking despite 50 of fentanyl  and no other sedation.  Precedex  not currently running.  PA pressures are somewhat higher than earlier in the evening despite 1.8 L out yesterday.  Not following commands on SBT during the last check.  Not currently appropriate for extubation.  4ts score of 6, high probability  eICU Interventions  Hold further heparin , switch argatroban   Increase frequency of fentanyl  as needed to every 8 hours     Intervention Category Intermediate Interventions: Respiratory distress - evaluation and management  Anita Aguirre 10/27/2023, 6:18 AM

## 2023-10-27 NOTE — Progress Notes (Addendum)
 PHARMACY - ANTICOAGULATION CONSULT NOTE  Pharmacy Consult for bivalirudin  > heparin  > argatroban   Indication: pulmonary embolus/acute L DVT/ECMO >> decannulation 7/2  Allergies  Allergen Reactions   Wellbutrin [Bupropion] Hives, Itching and Other (See Comments)    Skin crawling sensation    Patient Measurements: Height: 5' 7 (170.2 cm) Weight: 103.9 kg (229 lb 0.9 oz) IBW/kg (Calculated) : 61.6 HEPARIN  DW (KG): 86.4  Vital Signs: Temp: 100.2 F (37.9 C) (07/05 0500) Temp Source: Core (07/05 0400) Pulse Rate: 103 (07/05 0500)  Labs: Recent Labs    10/24/23 1011 10/24/23 1152 10/24/23 1610 10/24/23 1616 10/25/23 0453 10/25/23 0601 10/25/23 2247 10/26/23 0123 10/26/23 0445 10/26/23 0450 10/26/23 0851 10/26/23 1224 10/26/23 1228 10/26/23 1812 10/27/23 0400  HGB 8.5*   < > 8.7*   < > 8.8*   < > 8.7*   < >  --  9.2*   < >  --  9.2* 10.1* 10.0*  HCT 27.3*   < > 28.5*   < > 28.8*   < > 28.1*   < >  --  29.8*   < >  --  27.0* 33.2* 32.5*  PLT  --    < > 139*  --  157  --   --   --   --  111*  --   --   --   --  59*  APTT 63*  --  50*  --   --   --   --   --   --   --   --   --   --   --   --   HEPARINUNFRC  --   --   --    < >  --    < > 0.44  --  0.34  --   --   --   --   --  0.39  CREATININE  --    < > 0.96  --  1.09*   < >  --   --   --  0.80  --  0.83  --   --  0.97   < > = values in this interval not displayed.    Estimated Creatinine Clearance: 87.9 mL/min (by C-G formula based on SCr of 0.97 mg/dL).  Assessment: 30 YOF admitted with submassive PE with right heart strain. RV/LV ration 1.4. CT with bilateral PE with severe clot burden. Patient not on anticoagulation prior to hospitalization. Patient also has ongoing vaginal bleeding due to uterine fibroids, noted to be a chronic issue. Pharmacy initially consulted for heparin  dosing. Patient later decompensated, started on ECMO on 6/28 PM.  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 HIT antibody ordered. Per protocol will switch from heparin  to argatroban . Liver function panel okay from 6/28  Goal of Therapy:  Heparin  level 0.3-0.7 units/ml Monitor platelets by anticoagulation protocol: Yes   Plan:  Stop heparin  infusion Start argatroban  0.5 mcg/kg/min (ABW 103.9 kg) at time of heparin  discontinuation  aPTT in 4 hours  Monitor CBC and s/sx of bleeding daily  Lynwood Poplar, PharmD, BCPS Clinical Pharmacist 10/27/2023 6:46 AM   ADDENDUM -Patient previously therapeutic on bivalirudin  while on ECMO. Will switch argatroban  to bivalirudin .  Goal of Therapy:  aPTT 50-85 seconds  Monitor platelets  by anticoagulation protocol: Yes   Plan:  Stop heparin  infusion Start bivalirudin  0.045 mg/kg/hr (Wt 103.9 kg) -previously therapeutic on this rate aPTT in 4 hours  Monitor CBC and s/sx of bleeding daily  Lynwood Poplar, PharmD, BCPS Clinical Pharmacist 10/27/2023 7:22 AM

## 2023-10-27 NOTE — Progress Notes (Signed)
 PHARMACY - ANTICOAGULATION CONSULT NOTE  Pharmacy Consult for bivalirudin  > heparin  > argatroban   Indication: pulmonary embolus/acute L DVT/ECMO >> decannulation 7/2  Allergies  Allergen Reactions   Wellbutrin [Bupropion] Hives, Itching and Other (See Comments)    Skin crawling sensation   Heparin      HIT    Patient Measurements: Height: 5' 7 (170.2 cm) Weight: 103.9 kg (229 lb 0.9 oz) IBW/kg (Calculated) : 61.6 HEPARIN  DW (KG): 86.4  Vital Signs: Temp: 101.7 F (38.7 C) (07/05 1900) Temp Source: Esophageal (07/05 1600) Pulse Rate: 81 (07/05 1900)  Labs: Recent Labs    10/25/23 0453 10/25/23 0601 10/25/23 2247 10/26/23 0123 10/26/23 0445 10/26/23 0450 10/26/23 0851 10/26/23 1224 10/26/23 1228 10/27/23 0400 10/27/23 1050 10/27/23 1200 10/27/23 1657 10/27/23 1844  HGB 8.8*   < > 8.7*   < >  --  9.2*   < >  --    < > 10.0* 10.3*  --  10.3*  --   HCT 28.8*   < > 28.1*   < >  --  29.8*   < >  --    < > 32.5* 33.8*  --  34.0*  --   PLT 157  --   --   --   --  111*  --   --   --  59*  --   --   --   --   APTT  --   --   --   --   --   --   --   --   --   --   --  44*  --  43*  HEPARINUNFRC  --    < > 0.44  --  0.34  --   --   --   --  0.39  --   --   --   --   CREATININE 1.09*   < >  --   --   --  0.80  --  0.83  --  0.97  --  1.01*  --   --    < > = values in this interval not displayed.    Estimated Creatinine Clearance: 84.4 mL/min (A) (by C-G formula based on SCr of 1.01 mg/dL (H)).  Assessment: 17 YOF admitted with submassive PE with right heart strain. RV/LV ration 1.4. CT with bilateral PE with severe clot burden. Patient not on anticoagulation prior to hospitalization. Patient also has ongoing vaginal bleeding due to uterine fibroids, noted to be a chronic issue. Pharmacy initially consulted for heparin  dosing. Patient later decompensated, started on ECMO on 6/28 PM.  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  Repeat APTT remains low at 43 seconds, no bleeding issues.  Goal of Therapy:  aPTT 50-80 sec Monitor platelets by anticoagulation protocol: Yes   Plan:  Increase bivalirudin  to 0.06 mg/kg/hr  Order aPTT in 4 hours  Monitor CBC and s/sx of bleeding daily   Ozell Jamaica, PharmD, BCPS, [facility] Clinical Pharmacist [phone] Please check AMION for all [facility] Pharmacy numbers 10/27/2023

## 2023-10-27 NOTE — Progress Notes (Signed)
 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  Current temperature is 101.7 F  eICU Interventions  Blood cultures ordered.  After discussion with pharmacy, Vanco and cefepime  will be ordered for empiric antibiotic coverage in this patient was just being decannulated.     Intervention Category Intermediate Interventions: Other: (Fever)  Jerilynn Berg 10/27/2023, 10:08 PM

## 2023-10-27 NOTE — Progress Notes (Signed)
 Pharmacy Antibiotic Note  Anita Aguirre is a 48 y.o. female admitted on 10/20/2023 with PE s/p ECMO cannulation. Pt spiking fevers overnight, pharmacy has been consulted for vancomycin  and cefepime  dosing. Cr trending up slightly to 1 mg/dl this afternoon.  Plan: Cefepime  2g IV q8h Vancomycin  750mg  IV q12h - est AUC 481  Height: 5' 7 (170.2 cm) Weight: 103.9 kg (229 lb 0.9 oz) IBW/kg (Calculated) : 61.6  Temp (24hrs), Avg:100.3 F (37.9 C), Min:98.4 F (36.9 C), Max:101.8 F (38.8 C)  Recent Labs  Lab 10/21/23 1215 10/21/23 1735 10/22/23 0325 10/22/23 0410 10/23/23 0524 10/23/23 0950 10/23/23 1114 10/24/23 0440 10/24/23 0631 10/24/23 1610 10/24/23 1626 10/24/23 1907 10/25/23 0453 10/25/23 1647 10/26/23 0450 10/26/23 1224 10/27/23 0400 10/27/23 1200  WBC  --    < >  --    < >  --   --    < > 19.8*  --  20.8*  --   --  19.1*  --  18.3*  --  20.6*  --   CREATININE  --    < >  --    < >  --   --    < > 0.84  --  0.96  --   --  1.09* 0.88 0.80 0.83 0.97 1.01*  LATICACIDVEN  --    < >  --    < > 1.2 1.9  --   --  1.0  --  0.7 0.6  --   --   --   --   --   --   VANCOTROUGH  --   --  12*  --   --   --   --   --   --   --   --   --   --   --   --   --   --   --   VANCORANDOM 11  --   --   --   --   --   --   --   --   --   --   --   --   --   --   --   --   --    < > = values in this interval not displayed.    Estimated Creatinine Clearance: 84.4 mL/min (A) (by C-G formula based on SCr of 1.01 mg/dL (H)).    Allergies  Allergen Reactions   Wellbutrin [Bupropion] Hives, Itching and Other (See Comments)    Skin crawling sensation   Heparin      HIT     Ozell Jamaica, PharmD, BCPS, [facility] Clinical Pharmacist [phone] Please check AMION for all [facility] Pharmacy numbers 10/27/2023

## 2023-10-27 NOTE — Progress Notes (Signed)
 NAME:  Anita Aguirre, MRN:  [id number], DOB:  04-20-1976, LOS: 7 ADMISSION DATE:  10/20/2023, CONSULTATION DATE:  10/20/2023 REFERRING MD:  Dr Isadora, CHIEF COMPLAINT:  SOB/Chest pain  History of Present Illness:  48 y/o female with PMH for ADD (attention deficit disorder), Arthritis (04/24/2008), Diffuse cystic mastopathy (04/24/2010), Hypertension, Hypothyroidism,Mastitis and bleeding uterine fibroids who presented to [facility] with worsening SOB and chest pain for last 2 days worsening day of admission to hospital  Chest pain dull and not radiating.  She was found to have submassive Pulmonary embolism with right heart strain.  Her Troponin increased to 409, Lactic Acid down to 2.7 from 3.9. CT scan chest revealed, Bilateral pulmonary emboli with severe clot burden. 2. CT evidence of RIGHT ventricular strain. Positive for acute PE with CT evidence of right heart strain (RV/LV Ratio = 1.4) consistent with at least submassive (intermediate risk) PE. The presence of right heart strain has been associated with an increased risk of morbidity and mortality. Please refer to the Code PE Focused order set in EPIC. 3. Small pulmonary infarction in the RIGHT lower lobe. 4. Moderate hiatal hernia.  Patient says she has no previous h/o blood clots but does have a h/o spontaneous abortion.  She works from home and has a sit down job and her life style is very sedentary.  No family h/o Pulmonary emboli. At [facility] she was started on Heparin  drip and Milrinone  drip.  She is feeling better, currently not on pressors.  Case was d/w IR by Dr Isadora and she was not a good candidate for clot extraction but maybe a better case for catheter directed Thrombolysis despite have continually bleeding for several years, She says she has been having uterine bleeding since 2013 when she had a miscarriage.  Her uterine bleeding apparently from Fibroids has worsened over the last several months and she was being  considered for Hysterectomy at [facility] but has not made it there yet. No N/V/D, no Fever/chills, no recent flights or leg trauma.  She did c/o left calf pain over the last few days and increase size of left ankle. Denies LOC. Pertinent  Medical History  ADD (attention deficit disorder), Arthritis (04/24/2008), Diffuse cystic mastopathy (04/24/2010), Hypertension, Hypothyroidism, and Mastitis.   Significant Hospital Events: Including procedures, antibiotic start and stop dates in addition to other pertinent events   6/28: transfer from [place].  Underwent VA ECMO and catheter directed thrombolysis 6/29 remain on VA ECMO, lysis catheter removed by IR.  Continue to have vaginal bleeding/clots 6/30 ECMO flow was decreased to 3 L, patient tolerated well, bedside echocardiogram showed mildly reduced RV function compared to prior.  Continued to have vaginal bleeding/clots 7/2 ECMO decannulated 7/4 milrinone  was stopped, off vasopressor support  Interim History / Subjective:  Patient is tolerating spontaneous breathing trial but mental status precludes extubation Overnight she was agitated and restless, requiring Precedex  infusion Remain off vasopressors and inotropes Afebrile but white count slightly up   Objective    Blood pressure (!) 101/56, pulse (!) 115, temperature 99.9 F (37.7 C), resp. rate (!) 27, height 5' 7 (1.702 m), weight 103.9 kg, SpO2 97%. PAP: (34-63)/(1-26) 60/25 CVP:  [0 mmHg-19 mmHg] 0 mmHg  Vent Mode: CPAP;PSV FiO2 (%):  [50 %] 50 % Set Rate:  [20 bmp] 20 bmp Vt Set:  [490 mL] 490 mL PEEP:  [8 cmH20-10 cmH20] 8 cmH20 Pressure Support:  [8 cmH20] 8 cmH20 Plateau Pressure:  [21 cmH20-29 cmH20] 22 cmH20   Intake/Output  Summary (Last 24 hours) at 10/27/2023 0956 Last data filed at 10/27/2023 0900 Gross per 24 hour  Intake 3249.32 ml  Output 4610 ml  Net -1360.68 ml   Filed Weights   10/25/23 0415 10/26/23 0457 10/27/23 0407  Weight: 110.8 kg 107.6 kg 103.9 kg     Examination: General: Crtitically ill-appearing obese female, orally intubated HEENT: [place]/AT, eyes anicteric.  ETT and cortrak in place Neuro: Eyes closed, does not open, not following commands Chest: Tachypneic, clear to auscultation bilaterally, no wheezes or rhonchi Heart: Tachycardic, regular rhythm, no murmurs or gallops Abdomen: Soft, nondistended, bowel sounds present  Labs and images reviewed  Patient Lines/Drains/Airways Status     Active Line/Drains/Airways     Name Placement date Placement time Site Days   Arterial Line 10/20/23 Right Radial 10/20/23  1824  Radial  7   CVC Triple Lumen 10/20/23 Right Internal jugular 10/20/23  0600  -- 7   Peripheral IV (Ped) 10/19/23 20 G Hand 10/19/23  1637  -- 8   Negative Pressure Wound Therapy Groin Left;Anterior 10/24/23  1506  --  3   Urethral Catheter Lauraine Letters, RN Temperature probe 14 Fr. 10/19/23  2230  Temperature probe  8   Fecal Management System 30 mL 10/26/23  0330  -- 1   Airway 7.5 mm 10/20/23  1804  -- 7   Pulmonary Artery Catheter Left --  --  -- --   Small Bore Feeding Tube Left nare Marking at nare/corner of mouth 70 cm 10/25/23  0924  Left nare  2   Wound 10/24/23 1459 Surgical Closed Surgical Incision Groin Left 10/24/23  1459  Groin  3         Resolved problem list  Lactic acidosis SIRS response from ECMO cannulation, improved Acute kidney injury due to cardiorenal syndrome, improving hypophosphatemia Constipation Intolerant of tube feeds Acute RV failure with cardiogenic shock status post VA ECMO on 6/28 -7/2  Assessment and Plan  Acute bilateral submassive PE with right heart strain status post catheter directed thrombolysis Acute respiratory failure with hypoxia Thrombocytopenia, rule out HIT Right upper lobe lung infarction Vaginal bleeding status post uterine artery embolization, continue to bleed Acute blood loss anemia vaginal  bleeding Hypothyroidism Obesity Hypokalemia/Hypernatremia  Patient platelet count dropped to 59 from 111 yesterday, IV heparin  infusion was switched to argatroban  HIT panel is in process Remain off vasopressors and inotropes Stop Lasix  infusion X-ray chest looks clear today Vaginal bleeding is improving after initiation of Megace  H&H is stable Continue levothyroxine  Diet and exercise counseling as appropriate Continue tube feeds Continue aggressive electrolyte replacement and monitor Continue free water  flushes, serum sodium is trending down now   [employer] (right click and Reselect all SmartList Selections daily)   Diet/type: Advance tube feeds DVT prophylaxis: Argatroban  Pressure ulcer(s): Deferred to nursing notes GI prophylaxis: H2 blocker Lines: Discontinue lines today Foley: Voiding trial today Code Status:  full code. Last date of goals of care discussion: 6/28 patient and her mother were updated at bedside, decision was to continue full scope of care   Labs   CBC: Recent Labs  Lab 10/24/23 0440 10/24/23 0442 10/24/23 1610 10/24/23 1616 10/25/23 0453 10/25/23 0601 10/26/23 0450 10/26/23 0851 10/26/23 1034 10/26/23 1228 10/26/23 1812 10/27/23 0400  WBC 19.8*  --  20.8*  --  19.1*  --  18.3*  --   --   --   --  20.6*  HGB 8.3*   < > 8.7*   < > 8.8*   < >  9.2* 9.5* 9.7* 9.2* 10.1* 10.0*  HCT 26.9*   < > 28.5*   < > 28.8*   < > 29.8* 28.0* 31.7* 27.0* 33.2* 32.5*  MCV 87.1  --  88.2  --  89.2  --  86.4  --   --   --   --  86.9  PLT 151  --  139*  --  157  --  111*  --   --   --   --  59*   < > = values in this interval not displayed.    Basic Metabolic Panel: Recent Labs  Lab 10/22/23 1618 10/22/23 1630 10/23/23 2330 10/24/23 0039 10/24/23 1610 10/24/23 1616 10/25/23 0451 10/25/23 0453 10/25/23 0601 10/25/23 1647 10/25/23 1704 10/26/23 0450 10/26/23 0851 10/26/23 1224 10/26/23 1228 10/27/23 0400  NA 144   < >  --    < > 149*   < >  --   147*   < > 149*   < > 151* 150* 150* 151* 146*  K 4.2   < >  --    < > 3.6   < >  --  3.8   < > 3.3*   < > 3.3* 3.3* 3.2* 3.3* 3.2*  CL 108   < >  --    < > 109  --   --  109  --  110  --  110  --  107  --  107  CO2 25   < >  --    < > 29  --   --  28  --  30  --  30  --  30  --  26  GLUCOSE 96   < >  --    < > 135*  --   --  117*  --  138*  --  109*  --  137*  --  134*  BUN 11   < >  --    < > 17  --   --  20  --  18  --  21*  --  22*  --  22*  CREATININE 0.90   < >  --    < > 0.96  --   --  1.09*  --  0.88  --  0.80  --  0.83  --  0.97  CALCIUM  8.0*   < >  --    < > 8.7*  --   --  8.9  --  8.9  --  9.4  --  9.4  --  9.4  MG 2.1   < >  --    < > 2.4  --   --  2.5*  --  2.4  --  2.4  --   --   --  2.4  PHOS 4.4  --  3.3  --   --   --  5.2*  --   --   --   --  3.5  --   --   --  4.6   < > = values in this interval not displayed.   GFR: Estimated Creatinine Clearance: 87.9 mL/min (by C-G formula based on SCr of 0.97 mg/dL). Recent Labs  Lab 10/23/23 0950 10/23/23 1114 10/24/23 0631 10/24/23 1610 10/24/23 1626 10/24/23 1907 10/25/23 0453 10/26/23 0450 10/27/23 0400  WBC  --    < >  --  20.8*  --   --  19.1* 18.3* 20.6*  LATICACIDVEN 1.9  --  1.0  --  0.7 0.6  --   --   --    < > = values in this interval not displayed.    Liver Function Tests: Recent Labs  Lab 10/20/23 1525 10/20/23 2344  AST  --  18  ALT  --  9  ALKPHOS  --  14*  BILITOT  --  1.2  PROT  --  5.1*  ALBUMIN  3.1* 2.8*   No results for input(s): LIPASE, AMYLASE in the last 168 hours. No results for input(s): AMMONIA in the last 168 hours.  ABG    Component Value Date/Time   PHART 7.512 (H) 10/26/2023 1228   PCO2ART 40.2 10/26/2023 1228   PO2ART 96 10/26/2023 1228   HCO3 32.1 (H) 10/26/2023 1228   TCO2 33 (H) 10/26/2023 1228   ACIDBASEDEF 2.0 10/21/2023 1813   O2SAT 98 10/26/2023 1228     Coagulation Profile: Recent Labs  Lab 10/20/23 2344  INR 1.3*    Cardiac Enzymes: No results for  input(s): CKTOTAL, CKMB, CKMBINDEX, TROPONINI in the last 168 hours.  HbA1C: Hgb A1c MFr Bld  Date/Time Value Ref Range Status  10/20/2023 06:43 AM 5.0 4.8 - 5.6 % Final    Comment:    (NOTE) Diagnosis of Diabetes The following HbA1c ranges recommended by the American Diabetes Association (ADA) may be used as an aid in the diagnosis of diabetes mellitus.  Hemoglobin             Suggested A1C NGSP%              Diagnosis  <5.7                   Non Diabetic  5.7-6.4                Pre-Diabetic  >6.4                   Diabetic  <7.0                   Glycemic control for                       adults with diabetes.      CBG: Recent Labs  Lab 10/26/23 1957 10/26/23 2358 10/27/23 0403 10/27/23 0414 10/27/23 0753  GLUCAP 128* 131* 125* 130* 135*    The patient is critically ill due to acute bilateral submassive PE/acute respiratory failure/cardiogenic shock status post VA ECMO.  Critical care was necessary to treat or prevent imminent or life-threatening deterioration.  Critical care was time spent personally by me on the following activities: development of treatment plan with patient and/or surrogate as well as nursing, discussions with consultants, evaluation of patient's response to treatment, examination of patient, obtaining history from patient or surrogate, ordering and performing treatments and interventions, ordering and review of laboratory studies, ordering and review of radiographic studies, pulse oximetry, re-evaluation of patient's condition and participation in multidisciplinary rounds.   During this encounter critical care time was devoted to patient care services described in this note for 36 minutes.   Valinda Novas, MD [facility] See Amion for pager If no response to pager, please call [phone] until 7pm After 7pm, Please call E-link [phone]

## 2023-10-27 NOTE — Progress Notes (Signed)
 [facility] ADULT ICU REPLACEMENT PROTOCOL   The patient does apply for the [facility] Adult ICU Electrolyte Replacment Protocol based on the criteria listed below:   1.Exclusion criteria: TCTS, ECMO, Dialysis, and Myasthenia Gravis patients 2. Is GFR >/= 30 ml/min? Yes.    Patient's GFR today is >60 3. Is SCr </= 2? Yes.   Patient's SCr is 0.97 mg/dL 4. Did SCr increase >/= 0.5 in 24 hours? No. 5.Pt's weight >40kg  Yes.   6. Abnormal electrolyte(s): K 3.2  7. Electrolytes replaced per protocol 8.  Call MD STAT for K+ </= 2.5, Phos </= 1, or Mag </= 1 Physician:    Jadier Rockers A 10/27/2023 6:08 AM

## 2023-10-27 NOTE — Progress Notes (Signed)
 PHARMACY - ANTICOAGULATION CONSULT NOTE  Pharmacy Consult for bivalirudin  > heparin  > argatroban   Indication: pulmonary embolus/acute L DVT/ECMO >> decannulation 7/2  Allergies  Allergen Reactions   Wellbutrin [Bupropion] Hives, Itching and Other (See Comments)    Skin crawling sensation   Heparin      HIT    Patient Measurements: Height: 5' 7 (170.2 cm) Weight: 103.9 kg (229 lb 0.9 oz) IBW/kg (Calculated) : 61.6 HEPARIN  DW (KG): 86.4  Vital Signs: Temp: 100.6 F (38.1 C) (07/05 1200) Temp Source: Core (07/05 1200) Pulse Rate: 96 (07/05 1200)  Labs: Recent Labs    10/24/23 1610 10/24/23 1616 10/25/23 0453 10/25/23 0601 10/25/23 2247 10/26/23 0123 10/26/23 0445 10/26/23 0450 10/26/23 0851 10/26/23 1224 10/26/23 1228 10/26/23 1812 10/27/23 0400 10/27/23 1050 10/27/23 1200  HGB 8.7*   < > 8.8*   < > 8.7*   < >  --  9.2*   < >  --    < > 10.1* 10.0* 10.3*  --   HCT 28.5*   < > 28.8*   < > 28.1*   < >  --  29.8*   < >  --    < > 33.2* 32.5* 33.8*  --   PLT 139*  --  157  --   --   --   --  111*  --   --   --   --  59*  --   --   APTT 50*  --   --   --   --   --   --   --   --   --   --   --   --   --  44*  HEPARINUNFRC  --    < >  --    < > 0.44  --  0.34  --   --   --   --   --  0.39  --   --   CREATININE 0.96  --  1.09*   < >  --   --   --  0.80  --  0.83  --   --  0.97  --   --    < > = values in this interval not displayed.    Estimated Creatinine Clearance: 87.9 mL/min (by C-G formula based on SCr of 0.97 mg/dL).  Assessment: 82 YOF admitted with submassive PE with right heart strain. RV/LV ration 1.4. CT with bilateral PE with severe clot burden. Patient not on anticoagulation prior to hospitalization. Patient also has ongoing vaginal bleeding due to uterine fibroids, noted to be a chronic issue. Pharmacy initially consulted for heparin  dosing. Patient later decompensated, started on ECMO on 6/28 PM.  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  aPTT this afternoon came back subtherapeutic at 44, on bivalirudin  at 0.045 mg/kg/hr. Hgb 10.3. No infusion issues per RN - only mild vaginal bleeding.    Goal of Therapy:  aPTT 50-80 sec Monitor platelets by anticoagulation protocol: Yes   Plan:  Increase bivalirudin  to 0.053 mg/kg/hr  Order aPTT in 4 hours  Monitor CBC and s/sx of bleeding daily  Thank you for allowing pharmacy to participate in this patient's care,  Suzen Sour, PharmD, BCCCP Clinical Pharmacist  Phone: [phone] 10/27/2023 1:38 PM  Please check AMION for  all [facility] Pharmacy phone numbers After 10:00 PM, call Main Pharmacy [phone]

## 2023-10-27 NOTE — Progress Notes (Signed)
 Patient ID: Anita Aguirre, female   DOB: [birth year], 48 y.o.   MRN: [id number]     Advanced Heart Failure Rounding Note  Cardiologist: None  Chief Complaint: PE Subjective:    VA ECMO decannulated 7/2.   No pressors, stable MAP.  She is off milrinone .    Hgb stable 10.   Now off abx.   LE dopplers showed left-sided DVT.    Plts dropped to 59, stopped heparin  and on bivalirudin , HIT ab sent.   Vent FiO2 down to 0.5.   Swan: PA 36/7 RA 4  Objective:   Weight Range: 103.9 kg Body mass index is 35.88 kg/m.   Vital Signs:   Temp:  [99.5 F (37.5 C)-100.2 F (37.9 C)] 99.9 F (37.7 C) (07/05 0806) Pulse Rate:  [86-115] 115 (07/05 0806) Resp:  [27-32] 27 (07/05 0806) SpO2:  [93 %-97 %] 97 % (07/05 0826) Arterial Line BP: (112-202)/(67-106) 202/106 (07/05 0800) FiO2 (%):  [50 %] 50 % (07/05 0806) Weight:  [103.9 kg] 103.9 kg (07/05 0407) Last BM Date : 10/26/23  Weight change: Filed Weights   10/25/23 0415 10/26/23 0457 10/27/23 0407  Weight: 110.8 kg 107.6 kg 103.9 kg    Intake/Output:   Intake/Output Summary (Last 24 hours) at 10/27/2023 1013 Last data filed at 10/27/2023 0900 Gross per 24 hour  Intake 3128.9 ml  Output 4435 ml  Net -1306.1 ml      Physical Exam    General: sedated on vent Neck: No JVD, no thyromegaly or thyroid nodule.  Lungs: Clear to auscultation bilaterally with normal respiratory effort. CV: Nondisplaced PMI.  Heart regular S1/S2, no S3/S4, no murmur.  Trace ankle edema.   Abdomen: Soft, nontender, no hepatosplenomegaly, no distention.  Skin: Intact without lesions or rashes.  Neurologic: Sedated on vent.  Extremities: No clubbing or cyanosis.  HEENT: Normal.   Telemetry   NSR (personally reviewed)  Labs    CBC Recent Labs    10/26/23 0450 10/26/23 0851 10/26/23 1812 10/27/23 0400  WBC 18.3*  --   --  20.6*  HGB 9.2*   < > 10.1* 10.0*  HCT 29.8*   < > 33.2* 32.5*  MCV 86.4  --   --  86.9  PLT 111*  --   --  59*   <  > = values in this interval not displayed.   Basic Metabolic Panel Recent Labs    92/95/74 0450 10/26/23 0851 10/26/23 1224 10/26/23 1228 10/27/23 0400  NA 151*   < > 150* 151* 146*  K 3.3*   < > 3.2* 3.3* 3.2*  CL 110  --  107  --  107  CO2 30  --  30  --  26  GLUCOSE 109*  --  137*  --  134*  BUN 21*  --  22*  --  22*  CREATININE 0.80  --  0.83  --  0.97  CALCIUM  9.4  --  9.4  --  9.4  MG 2.4  --   --   --  2.4  PHOS 3.5  --   --   --  4.6   < > = values in this interval not displayed.   Liver Function Tests No results for input(s): AST, ALT, ALKPHOS, BILITOT, PROT, ALBUMIN  in the last 72 hours.  No results for input(s): LIPASE, AMYLASE in the last 72 hours. Cardiac Enzymes No results for input(s): CKTOTAL, CKMB, CKMBINDEX, TROPONINI in the last 72 hours.  BNP: BNP (last 3  results) Recent Labs    10/20/23 0643  BNP 308.4*    ProBNP (last 3 results) No results for input(s): PROBNP in the last 8760 hours.   D-Dimer No results for input(s): DDIMER in the last 72 hours. Hemoglobin A1C No results for input(s): HGBA1C in the last 72 hours.  Fasting Lipid Panel No results for input(s): CHOL, HDL, LDLCALC, TRIG, CHOLHDL, LDLDIRECT in the last 72 hours. Thyroid Function Tests No results for input(s): TSH, T4TOTAL, T3FREE, THYROIDAB in the last 72 hours.  Invalid input(s): FREET3  Other results:   Imaging    DG Chest 1 View Result Date: 10/27/2023 CLINICAL DATA:  48 year old female intubated. EXAM: CHEST  1 VIEW COMPARISON:  Portable chest yesterday and earlier. FINDINGS: Portable AP semi upright view at 0751 hours. Swan-Ganz catheter tip is now at the main pulmonary outflow. Endotracheal tube tip is at the level the clavicles. Enteric feeding tube tracks to the abdomen with tip not included. Stable right IJ central line. Larger lung volumes. Improved bilateral ventilation. Allowing for portable technique the lungs  are clear. Normal cardiac size and mediastinal contours. Stable visualized osseous structures. IMPRESSION: 1. Improved lung volumes and No acute cardiopulmonary abnormality. 2. Swan-Ganz catheter tip now at the main pulmonary outflow. Otherwise stable visible lines and tubes. Electronically Signed   By: VEAR Hurst M.D.   On: 10/27/2023 08:05     Medications:     Scheduled Medications:  Chlorhexidine  Gluconate Cloth  6 each Topical Daily   famotidine   20 mg Per Tube BID   feeding supplement (PROSource TF20)  60 mL Per Tube Daily   fiber supplement (BANATROL TF)  60 mL Per Tube BID   free water   150 mL Per Tube Q4H   insulin  aspart  0-20 Units Subcutaneous Q4H   levothyroxine   88 mcg Per Tube Q0600   megestrol   40 mg Per Tube BID   mouth rinse  15 mL Mouth Rinse Q2H   sodium chloride  flush  3 mL Intravenous Q12H    Infusions:  sodium chloride      albumin  human Stopped (10/22/23 1227)   bivalirudin  (ANGIOMAX ) 250 mg in sodium chloride  0.9 % 500 mL (0.5 mg/mL) infusion 0.045 mg/kg/hr (10/27/23 0900)   dexmedetomidine  (PRECEDEX ) IV infusion 0.5 mcg/kg/hr (10/27/23 0827)   feeding supplement (VITAL 1.5 CAL) 55 mL/hr at 10/27/23 0900   norepinephrine  (LEVOPHED ) Adult infusion Stopped (10/26/23 0230)   potassium chloride  10 mEq (10/27/23 0905)    PRN Medications: sodium chloride , acetaminophen  (TYLENOL ) oral liquid 160 mg/5 mL, albumin  human, albuterol , docusate sodium , fentaNYL  (SUBLIMAZE ) injection, iohexol , labetalol , ondansetron  (ZOFRAN ) IV, mouth rinse, mouth rinse, polyethylene glycol, sodium chloride  flush    Assessment/Plan   1. 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 CT chest with on 10/19/23 with bilateral pulmonary emboli with severe clot burden; clot is in the segmental and distal branches, not amenable to thrombectomy.  Cannulated for V-A ECMO fem-fem on 10/20/23 for RV failure. RV on 6/28 with LV EF 65-70%, D septum, RV function moderately reduced.  Most recent echo  7/1 with LV EF normal, RV mildly dysfunctional and mildly dilated, IVC small. VA ECMO decannulated 7/2.  BP stable off pressors.  Now off milrinone .  Today, PA pressure stable 36/6, RA 4 with good diuresis yesterday on Lasix  6 mg/hr + acetazolamide .   - Can remove Swan.  - Stop Lasix  today.  2. Acute PE: With RV failure. Bilateral, received TPA to each PA by IR. Has left leg DVT.  Platelets dropped to 59K today.  - Stopped heparin  gtt and started bivalirudin , sent HIT ab.  3. Uterine fibroids with bleeding: Now s/p uterine artery embolization 10/20/23. Still with oozing and clots. Hgb 10 today.  - Follow CBC, transfuse hgb < 8.   4. Acute hypoxemic respiratory failure:  FiO2 0.5.    - Weaning vent per CCM. 5. ID: She was treated with vancomycin /meropenem , has completed course.   6. Hypernatremia: Na 150, stopping Lasix .   Cardiology will sign off, call with questions.   CRITICAL CARE Performed by: Ezra Shuck  Total critical care time: 40 minutes  Critical care time was exclusive of separately billable procedures and treating other patients.  Critical care was necessary to treat or prevent imminent or life-threatening deterioration.  Critical care was time spent personally by me on the following activities: development of treatment plan with patient and/or surrogate as well as nursing, discussions with consultants, evaluation of patient's response to treatment, examination of patient, obtaining history from patient or surrogate, ordering and performing treatments and interventions, ordering and review of laboratory studies, ordering and review of radiographic studies, pulse oximetry and re-evaluation of patient's condition.   Length of Stay: 7  Ezra Shuck, MD  10/27/2023, 10:13 AM  Advanced Heart Failure Team Pager [phone] (M-F; 7a - 5p)  Please contact [facility] for night-coverage after hours (5p -7a ) and weekends on [email or web address]

## 2023-10-28 LAB — BLOOD CULTURE ID PANEL (REFLEXED) - BCID2

## 2023-10-28 LAB — GLUCOSE, CAPILLARY
Glucose-Capillary: 145 mg/dL — ABNORMAL HIGH (ref 70–99)
Glucose-Capillary: 141 mg/dL — ABNORMAL HIGH (ref 70–99)
Glucose-Capillary: 95 mg/dL (ref 70–99)
Glucose-Capillary: 96 mg/dL (ref 70–99)
Glucose-Capillary: 108 mg/dL — ABNORMAL HIGH (ref 70–99)
Glucose-Capillary: 83 mg/dL (ref 70–99)
Glucose-Capillary: 89 mg/dL (ref 70–99)

## 2023-10-28 LAB — MAGNESIUM: Magnesium: 2.6 mg/dL — ABNORMAL HIGH (ref 1.7–2.4)

## 2023-10-28 LAB — HEMOGLOBIN AND HEMATOCRIT, BLOOD
HCT: 29.7 % — ABNORMAL LOW (ref 36.0–46.0)
HCT: 30.1 % — ABNORMAL LOW (ref 36.0–46.0)
HCT: 30.5 % — ABNORMAL LOW (ref 36.0–46.0)
Hemoglobin: 9.3 g/dL — ABNORMAL LOW (ref 12.0–15.0)
Hemoglobin: 9.1 g/dL — ABNORMAL LOW (ref 12.0–15.0)
Hemoglobin: 9.2 g/dL — ABNORMAL LOW (ref 12.0–15.0)

## 2023-10-28 LAB — CBC
HCT: 31.2 % — ABNORMAL LOW (ref 36.0–46.0)
Hemoglobin: 9.5 g/dL — ABNORMAL LOW (ref 12.0–15.0)
MCH: 27 pg (ref 26.0–34.0)
MCHC: 30.4 g/dL (ref 30.0–36.0)
MCV: 88.6 fL (ref 80.0–100.0)
Platelets: 58 K/uL — ABNORMAL LOW (ref 150–400)
RBC: 3.52 MIL/uL — ABNORMAL LOW (ref 3.87–5.11)
RDW: 18.6 % — ABNORMAL HIGH (ref 11.5–15.5)
WBC: 23 K/uL — ABNORMAL HIGH (ref 4.0–10.5)
nRBC: 0.3 % — ABNORMAL HIGH (ref 0.0–0.2)

## 2023-10-28 LAB — BASIC METABOLIC PANEL WITH GFR
Anion gap: 9 (ref 5–15)
BUN: 23 mg/dL — ABNORMAL HIGH (ref 6–20)
CO2: 22 mmol/L (ref 22–32)
Calcium: 9 mg/dL (ref 8.9–10.3)
Chloride: 118 mmol/L — ABNORMAL HIGH (ref 98–111)
Creatinine, Ser: 0.75 mg/dL (ref 0.44–1.00)
GFR, Estimated: >60 mL/min (ref >60)
Glucose, Bld: 153 mg/dL — ABNORMAL HIGH (ref 70–99)
Potassium: 3.4 mmol/L — ABNORMAL LOW (ref 3.5–5.1)
Sodium: 149 mmol/L — ABNORMAL HIGH (ref 135–145)

## 2023-10-28 LAB — APTT
aPTT: 41 s — ABNORMAL HIGH (ref 24–36)
aPTT: 46 s — ABNORMAL HIGH (ref 24–36)
aPTT: 47 s — ABNORMAL HIGH (ref 24–36)
aPTT: 52 s — ABNORMAL HIGH (ref 24–36)

## 2023-10-28 MED ORDER — ORAL CARE MOUTH RINSE: 15.0000 mL | OROMUCOSAL | Status: DC | PRN

## 2023-10-28 MED ORDER — FREE WATER: 200.0000 mL | Status: DC

## 2023-10-28 MED ORDER — POTASSIUM CHLORIDE CRYS ER 20 MEQ PO TBCR
40.0000 meq | EXTENDED_RELEASE_TABLET | Freq: Once | ORAL | Status: AC
  Administered 2023-10-28: 40 meq via ORAL
  Filled 2023-10-28: qty 2

## 2023-10-28 MED ORDER — FAMOTIDINE 20 MG PO TABS
20.0000 mg | ORAL_TABLET | Freq: Two times a day (BID) | ORAL | Status: DC
  Administered 2023-10-28 – 2023-10-29 (×2): 20 mg via ORAL
  Filled 2023-10-28 (×2): qty 1

## 2023-10-28 MED ORDER — TRAMADOL HCL 50 MG PO TABS
50.0000 mg | ORAL_TABLET | Freq: Four times a day (QID) | ORAL | Status: AC | PRN
  Administered 2023-10-28 – 2023-10-29 (×2): 50 mg via ORAL
  Filled 2023-10-28 (×2): qty 1

## 2023-10-28 MED ORDER — VANCOMYCIN HCL 2000 MG/400ML IV SOLN
2000.0000 mg | Freq: Once | INTRAVENOUS | Status: DC
  Filled 2023-10-28: qty 400

## 2023-10-28 MED ORDER — LEVOTHYROXINE SODIUM 88 MCG PO TABS
88.0000 ug | ORAL_TABLET | Freq: Every day | ORAL | Status: DC
  Administered 2023-10-29 – 2023-11-04 (×7): 88 ug via ORAL
  Filled 2023-10-28 (×8): qty 1

## 2023-10-28 MED ORDER — VANCOMYCIN HCL IN DEXTROSE 1-5 GM/200ML-% IV SOLN: 1000.0000 mg | Freq: Two times a day (BID) | INTRAVENOUS | Status: DC

## 2023-10-28 MED ORDER — INSULIN ASPART 100 UNIT/ML IJ SOLN
0.0000 [IU] | Freq: Three times a day (TID) | INTRAMUSCULAR | Status: DC
  Administered 2023-10-30: 3 [IU] via SUBCUTANEOUS

## 2023-10-28 MED ORDER — VANCOMYCIN HCL IN DEXTROSE 1-5 GM/200ML-% IV SOLN
1000.0000 mg | Freq: Two times a day (BID) | INTRAVENOUS | Status: DC
  Administered 2023-10-28 – 2023-10-30 (×4): 1000 mg via INTRAVENOUS
  Filled 2023-10-28 (×4): qty 200

## 2023-10-28 MED ORDER — INSULIN ASPART 100 UNIT/ML IJ SOLN: 0.0000 [IU] | Freq: Every day | INTRAMUSCULAR | Status: DC

## 2023-10-28 MED ORDER — BETHANECHOL CHLORIDE 10 MG PO TABS
10.0000 mg | ORAL_TABLET | Freq: Three times a day (TID) | ORAL | Status: DC
  Administered 2023-10-28 – 2023-11-04 (×22): 10 mg via ORAL
  Filled 2023-10-28 (×25): qty 1

## 2023-10-28 MED ORDER — POTASSIUM CHLORIDE 20 MEQ PO PACK
40.0000 meq | PACK | Freq: Once | ORAL | Status: AC
  Administered 2023-10-28: 40 meq
  Filled 2023-10-28: qty 2

## 2023-10-28 MED ORDER — MEGESTROL ACETATE 40 MG PO TABS
40.0000 mg | ORAL_TABLET | Freq: Two times a day (BID) | ORAL | Status: DC
  Administered 2023-10-28 – 2023-10-29 (×2): 40 mg via ORAL
  Filled 2023-10-28 (×2): qty 1

## 2023-10-28 MED ORDER — POTASSIUM CHLORIDE 20 MEQ PO PACK
40.0000 meq | PACK | Freq: Two times a day (BID) | ORAL | Status: DC
  Administered 2023-10-28: 40 meq
  Filled 2023-10-28 (×2): qty 2

## 2023-10-28 NOTE — Progress Notes (Addendum)
 PHARMACY - ANTICOAGULATION CONSULT NOTE  Pharmacy Consult for bivalirudin  > heparin  > bivalirudin  Indication: pulmonary embolus/acute L DVT/ECMO >> decannulation 7/2  Allergies  Allergen Reactions   Wellbutrin [Bupropion] Hives, Itching and Other (See Comments)    Skin crawling sensation   Heparin      HIT    Patient Measurements: Height: 5' 7 (170.2 cm) Weight: 104.3 kg (229 lb 15 oz) IBW/kg (Calculated) : 61.6 HEPARIN  DW (KG): 86.4  Vital Signs: Temp: 100 F (37.8 C) (07/06 0400) Temp Source: Esophageal (07/06 0400) Pulse Rate: 65 (07/06 0400)  Labs: Recent Labs    10/25/23 0453 10/25/23 0601 10/25/23 2247 10/26/23 0123 10/26/23 0445 10/26/23 0450 10/26/23 0851 10/26/23 1224 10/26/23 1228 10/27/23 0400 10/27/23 1050 10/27/23 1050 10/27/23 1200 10/27/23 1657 10/27/23 1844 10/27/23 2210 10/27/23 2359 10/28/23 0410  HGB 8.8*   < > 8.7*   < >  --  9.2*   < >  --    < > 10.0* 10.3*  --   --  10.3*  --  9.5*  --   --   HCT 28.8*   < > 28.1*   < >  --  29.8*   < >  --    < > 32.5* 33.8*  --   --  34.0*  --  31.9*  --   --   PLT 157  --   --   --   --  111*  --   --   --  59*  --   --   --   --   --   --   --   --   APTT  --   --   --   --   --   --   --   --   --   --   --    < > 44*  --  43*  --  52* 47*  HEPARINUNFRC  --    < > 0.44  --  0.34  --   --   --   --  0.39  --   --   --   --   --   --   --   --   CREATININE 1.09*   < >  --   --   --  0.80  --  0.83  --  0.97  --   --  1.01*  --   --   --   --   --    < > = values in this interval not displayed.    Estimated Creatinine Clearance: 84.6 mL/min (A) (by C-G formula based on SCr of 1.01 mg/dL (H)).  Assessment: 61 YOF admitted with submassive PE with right heart strain. RV/LV ration 1.4. CT with bilateral PE with severe clot burden. Patient not on anticoagulation prior to hospitalization. Patient also has ongoing vaginal bleeding due to uterine fibroids, noted to be a chronic issue. Pharmacy initially  consulted for heparin  dosing. Patient later decompensated, started on ECMO on 6/28 PM.  6/28 PM VA ECMO cannulation + catheter directed alteplase  + uterine artery embolization (hx uterine fibroids); heparin  overnight, switched to bival 6/29 AM 7/2 VA ECMO decannulation - started on heparin  @1900  7/5 concerns for HIT - changed back to bival.  AM: aPTT therapeutic at 52 seconds, no bleeding issues per RN  Goal of Therapy:  aPTT 50-80 sec Monitor platelets by anticoagulation protocol: Yes   Plan:  Continue bivalirudin  to 0.06 mg/kg/hr (DW 103.9 kg)  Check aPTT with AM labs  Monitor CBC and s/sx of bleeding daily   Lynwood Poplar, PharmD, BCPS Clinical Pharmacist 10/28/2023 12:35 AM    ADDENDUM -AM labs aPTT returned below goal at 47 seconds. Per RN, no issues running continuously or signs/symptoms of bleeding. AM CBC shows plts still low at 58  Goal of Therapy:  aPTT 50-80 sec Monitor platelets by anticoagulation protocol: Yes   Plan:  Increase bivalirudin  to 0.072 mg/kg/hr (DW 103.9 kg) Check aPTT in 4 hours  Monitor CBC and s/sx of bleeding daily  Lynwood Poplar, PharmD, BCPS Clinical Pharmacist 10/28/2023 4:41 AM

## 2023-10-28 NOTE — Progress Notes (Addendum)
 PHARMACY - ANTICOAGULATION CONSULT NOTE  Pharmacy Consult for bivalirudin  > heparin  > argatroban   Indication: pulmonary embolus/acute L DVT/ECMO >> decannulation 7/2  Allergies  Allergen Reactions   Wellbutrin [Bupropion] Hives, Itching and Other (See Comments)    Skin crawling sensation   Heparin      HIT    Patient Measurements: Height: 5' 7 (170.2 cm) Weight: 104.3 kg (229 lb 15 oz) IBW/kg (Calculated) : 61.6 HEPARIN  DW (KG): 86.4  Vital Signs: Temp: 100 F (37.8 C) (07/06 0848) Temp Source: Esophageal (07/06 0400) Pulse Rate: 69 (07/06 1000)  Labs: Recent Labs    10/25/23 2247 10/26/23 0123 10/26/23 0445 10/26/23 0450 10/26/23 0851 10/27/23 0400 10/27/23 1050 10/27/23 1200 10/27/23 1657 10/27/23 2210 10/27/23 2359 10/28/23 0410 10/28/23 0900 10/28/23 0929  HGB 8.7*   < >  --  9.2*   < > 10.0*   < >  --    < > 9.5*  --  9.5*  --  9.3*  HCT 28.1*   < >  --  29.8*   < > 32.5*   < >  --    < > 31.9*  --  31.2*  --  29.7*  PLT  --   --   --  111*  --  59*  --   --   --   --   --  58*  --   --   APTT  --   --   --   --   --   --   --  44*   < >  --  52* 47* 46*  --   HEPARINUNFRC 0.44  --  0.34  --   --  0.39  --   --   --   --   --   --   --   --   CREATININE  --   --   --  0.80   < > 0.97  --  1.01*  --   --   --  0.75  --   --    < > = values in this interval not displayed.    Estimated Creatinine Clearance: 106.8 mL/min (by C-G formula based on SCr of 0.75 mg/dL).  Assessment: 63 YOF admitted with submassive PE with right heart strain. RV/LV ration 1.4. CT with bilateral PE with severe clot burden. Patient not on anticoagulation prior to hospitalization. Patient also has ongoing vaginal bleeding due to uterine fibroids, noted to be a chronic issue. Pharmacy initially consulted for heparin  dosing. Patient later decompensated, started on ECMO on 6/28 PM.  6/28 PM VA ECMO cannulation + catheter directed alteplase  + uterine artery embolization (hx uterine  fibroids); heparin  overnight, switched to bival 6/29 AM 7/2 VA ECMO decannulation - started on heparin  @1900  7/5 concerns for HIT - changed back to bival.  aPTT came back subtherapeutic at 46, on 0.072 mg/kg/hr. Hgb 9.3, plt 58. No s/sx of bleeding or infusion issues. Infusion running peripherally and level being drawn centrally.  Goal of Therapy:  aPTT 50-80 sec Monitor platelets by anticoagulation protocol: Yes   Plan:  Increase bivalirudin  to 0.084 mg/kg/hr  Order aPTT in 4 hours  Monitor CBC and s/sx of bleeding daily   Thank you for allowing pharmacy to participate in this patient's care,  Suzen Sour, PharmD, BCCCP Clinical Pharmacist  Phone: [phone] 10/28/2023 11:43 AM  Please check AMION for all [facility] phone numbers After 10:00 PM, call Main Pharmacy [phone]

## 2023-10-28 NOTE — Progress Notes (Signed)
 eLink Physician-Brief Progress Note Patient Name: [name] DOB: 24-Jan-1976 MRN: [id number]   Date of Service  10/28/2023  HPI/Events of Note  Pharmacy just received a BCID call on this lady- 2/4 staph epi with mecA (different sets)- she is febrile with leukocytosis.   eICU Interventions  Initiating vancomycin , pharmacy to dose     Intervention Category Intermediate Interventions: Infection - evaluation and management;Communication with other healthcare providers and/or family  Damien ONEIDA Grout 10/28/2023, 9:12 PM

## 2023-10-28 NOTE — Progress Notes (Signed)
 Pharmacy Antibiotic Note  Anita Aguirre is a 48 y.o. female admitted on 10/20/2023 with PE s/p ECMO cannulation. Pt spiking fevers overnight with leukocytosis.   Received BCID Call : pt growing 2/4 bottles (different sets) GPCs in clusters identified as staph epi with mecA on BCID. Discussed with Damien Fava, MD and decision made to start vancomycin .   Patient received a dose of vancomycin  750 mg IV 7/5 2352. Patient appears to be hemodynamically stable for the time being. Will omit load.   Plan: Cont Cefepime  2g IV q8h Start Vancomycin  1000 Q12H (eACU 520, Vd used 0.5, Scr used 0.8)  F/U need for CFP in AM   Height: 5' 7 (170.2 cm) Weight: 104.3 kg (229 lb 15 oz) IBW/kg (Calculated) : 61.6  Temp (24hrs), Avg:99.8 F (37.7 C), Min:97.8 F (36.6 C), Max:100.6 F (38.1 C)  Recent Labs  Lab 10/22/23 0325 10/22/23 0410 10/23/23 0524 10/23/23 0950 10/23/23 1114 10/24/23 0631 10/24/23 1610 10/24/23 1626 10/24/23 1907 10/25/23 0453 10/25/23 1647 10/26/23 0450 10/26/23 1224 10/27/23 0400 10/27/23 1200 10/28/23 0410  WBC  --    < >  --   --    < >  --  20.8*  --   --  19.1*  --  18.3*  --  20.6*  --  23.0*  CREATININE  --    < >  --   --    < >  --  0.96  --   --  1.09*   < > 0.80 0.83 0.97 1.01* 0.75  LATICACIDVEN  --    < > 1.2 1.9  --  1.0  --  0.7 0.6  --   --   --   --   --   --   --   VANCOTROUGH 12*  --   --   --   --   --   --   --   --   --   --   --   --   --   --   --    < > = values in this interval not displayed.    Estimated Creatinine Clearance: 106.8 mL/min (by C-G formula based on SCr of 0.75 mg/dL).    Allergies  Allergen Reactions   Wellbutrin [Bupropion] Hives, Itching and Other (See Comments)    Skin crawling sensation   Heparin      HIT    Massie Fila, PharmD Clinical Pharmacist  10/28/2023 9:23 PM \

## 2023-10-28 NOTE — Progress Notes (Signed)
 eLink Physician-Brief Progress Note Patient Name: Anita Aguirre DOB: 1975/09/29 MRN: [id number]   Date of Service  10/28/2023  HPI/Events of Note  Patient extubated today, taking PO's, complaining of bilateral leg pain with no relief from Tylenol , pedal pulses remain palpable, + DVT LE   on angiomax .  Seen grimacing  eICU Interventions  Ordered Ultram  50 mg q 6 prn for breakthrough pain     Intervention Category Intermediate Interventions: Pain - evaluation and management  [name] [name] 10/28/2023, 8:03 PM

## 2023-10-28 NOTE — Procedures (Signed)
 Extubation Procedure Note  Patient Details:   Name: Anita Aguirre DOB: 08/04/1975 MRN: [id number]   Airway Documentation:    Vent end date: 10/28/23 Vent end time: 0848   Evaluation  O2 sats: stable throughout Complications: No apparent complications Patient did tolerate procedure well. Bilateral Breath Sounds: Clear, Diminished   Yes  Pt extubated per MD order w/o complication. + cuff leak noted. No stridor post extubation. Pt placed on 6 lpm. Tolerating well at this time.  Bari CHRISTELLA Daunt 10/28/2023, 8:55 AM

## 2023-10-28 NOTE — Progress Notes (Signed)
 [facility] ADULT ICU REPLACEMENT PROTOCOL   The patient does apply for the [facility] Adult ICU Electrolyte Replacment Protocol based on the criteria listed below:   1.Exclusion criteria: TCTS, ECMO, Dialysis, and Myasthenia Gravis patients 2. Is GFR >/= 30 ml/min? Yes.    Patient's GFR today is >60 3. Is SCr </= 2? Yes.   Patient's SCr is 0.75 mg/dL 4. Did SCr increase >/= 0.5 in 24 hours? No. 5.Pt's weight >40kg  Yes.   6. Abnormal electrolyte(s): K+ 3.4  7. Electrolytes replaced per protocol 8.  Call MD STAT for K+ </= 2.5, Phos </= 1, or Mag </= 1 Physician:  Dr. Joelyn Rummer, Recardo ORN 10/28/2023 5:13 AM

## 2023-10-28 NOTE — Plan of Care (Signed)

## 2023-10-28 NOTE — Progress Notes (Signed)
 NAME:  Anita Aguirre, MRN:  [id number], DOB:  Aug 01, 1975, LOS: 8 ADMISSION DATE:  10/20/2023, CONSULTATION DATE:  10/20/2023 REFERRING MD:  Dr Isadora, CHIEF COMPLAINT:  SOB/Chest pain  History of Present Illness:  48 y/o female with PMH for ADD (attention deficit disorder), Arthritis (04/24/2008), Diffuse cystic mastopathy (04/24/2010), Hypertension, Hypothyroidism,Mastitis and bleeding uterine fibroids who presented to [facility] with worsening SOB and chest pain for last 2 days worsening day of admission to hospital  Chest pain dull and not radiating.  She was found to have submassive Pulmonary embolism with right heart strain.  Her Troponin increased to 409, Lactic Acid down to 2.7 from 3.9. CT scan chest revealed, Bilateral pulmonary emboli with severe clot burden. 2. CT evidence of RIGHT ventricular strain. Positive for acute PE with CT evidence of right heart strain (RV/LV Ratio = 1.4) consistent with at least submassive (intermediate risk) PE. The presence of right heart strain has been associated with an increased risk of morbidity and mortality. Please refer to the Code PE Focused order set in EPIC. 3. Small pulmonary infarction in the RIGHT lower lobe. 4. Moderate hiatal hernia.  Patient says she has no previous h/o blood clots but does have a h/o spontaneous abortion.  She works from home and has a sit down job and her life style is very sedentary.  No family h/o Pulmonary emboli. At [facility] she was started on Heparin  drip and Milrinone  drip.  She is feeling better, currently not on pressors.  Case was d/w IR by Dr Isadora and she was not a good candidate for clot extraction but maybe a better case for catheter directed Thrombolysis despite have continually bleeding for several years, She says she has been having uterine bleeding since 2013 when she had a miscarriage.  Her uterine bleeding apparently from Fibroids has worsened over the last several months and she was being  considered for Hysterectomy at [facility] but has not made it there yet. No N/V/D, no Fever/chills, no recent flights or leg trauma.  She did c/o left calf pain over the last few days and increase size of left ankle. Denies LOC. Pertinent  Medical History  ADD (attention deficit disorder), Arthritis (04/24/2008), Diffuse cystic mastopathy (04/24/2010), Hypertension, Hypothyroidism, and Mastitis.   Significant Hospital Events: Including procedures, antibiotic start and stop dates in addition to other pertinent events   6/28: transfer from [place].  Underwent [facility] ECMO and catheter directed thrombolysis 6/29 remain on VA ECMO, lysis catheter removed by IR.  Continue to have vaginal bleeding/clots 6/30 ECMO flow was decreased to 3 L, patient tolerated well, bedside echocardiogram showed mildly reduced RV function compared to prior.  Continued to have vaginal bleeding/clots 7/2 ECMO decannulated 7/4 milrinone  was stopped, off vasopressor support 7/5 tolerating spontaneous breathing trial but agitated and restless  Interim History / Subjective:  Started spiking fever with Tmax 101.5, white count went up to 23K Lasix  infusion was stopped She is awake, following commands Tolerating spontaneous breathing trial  Objective    Blood pressure (!) 101/56, pulse 60, temperature 99.5 F (37.5 C), resp. rate (!) 30, height 5' 7 (1.702 m), weight 104.3 kg, SpO2 100%. PAP: (32-60)/(-6-25) 36/-6 CVP:  [0 mmHg-19 mmHg] 15 mmHg  Vent Mode: PRVC FiO2 (%):  [40 %-50 %] 40 % Set Rate:  [20 bmp] 20 bmp Vt Set:  [490 mL] 490 mL PEEP:  [5 cmH20-8 cmH20] 5 cmH20 Pressure Support:  [8 cmH20] 8 cmH20 Plateau Pressure:  [18 cmH20-22 cmH20] 18 cmH20  Intake/Output Summary (Last 24 hours) at 10/28/2023 0750 Last data filed at 10/28/2023 0700 Gross per 24 hour  Intake 3500.79 ml  Output 2650 ml  Net 850.79 ml   Filed Weights   10/26/23 0457 10/27/23 0407 10/28/23 0431  Weight: 107.6 kg 103.9 kg 104.3 kg     Examination: General: Crtitically ill-appearing middle-aged obese female, orally intubated HEENT: [place]/AT, eyes anicteric.  ETT and cortrak in place Neuro: Awake, following commands, moving all 4 extremities Chest: Coarse breath sounds, no wheezes or rhonchi Heart: Regular rate and rhythm, no murmurs or gallops Abdomen: Soft, nondistended, bowel sounds present  Labs and images reviewed  Patient Lines/Drains/Airways Status     Active Line/Drains/Airways     Name Placement date Placement time Site Days   Arterial Line 10/20/23 Right Radial 10/20/23  1824  Radial  8   CVC Triple Lumen 10/20/23 Right Internal jugular 10/20/23  0600  -- 8   Peripheral IV (Ped) 10/19/23 20 G Hand 10/19/23  1637  -- 9   Negative Pressure Wound Therapy Groin Left;Anterior 10/24/23  1506  --  4   Urethral Catheter Ole Piety Latex;Straight-tip 14 Fr. 10/27/23  1824  Latex;Straight-tip  1   Fecal Management System 30 mL 10/26/23  0330  -- 2   Airway 7.5 mm 10/20/23  1804  -- 8   Small Bore Feeding Tube Left nare Marking at nare/corner of mouth 70 cm 10/25/23  0924  Left nare  3   Wound 10/24/23 1459 Surgical Closed Surgical Incision Groin Left 10/24/23  1459  Groin  4        Resolved problem list  Lactic acidosis SIRS response from ECMO cannulation, improved Acute kidney injury due to cardiorenal syndrome, improving hypophosphatemia Constipation Intolerant of tube feeds Acute RV failure with cardiogenic shock status post VA ECMO on 6/28 -7/2  Assessment and Plan  Acute bilateral submassive PE with right heart strain status post catheter directed thrombolysis Acute DVT involving left popliteal, left posterior tibial and peroneal veins Thrombocytopenia, rule out HIT Acute respiratory failure with hypoxia Right upper lobe lung infarction Vaginal bleeding status post uterine artery embolization, continue to bleed Acute blood loss anemia vaginal  bleeding Hypothyroidism Obesity Hypokalemia/Hypernatremia Acute urinary retention  Continue Bival, monitor PTT Doppler ultrasound of lower extremity is suggestive of acute DVT HIT antibodies are in process Platelet counts are low but is stable Continue lung protective ventilation VAP prevention bundle in place Tolerating spontaneous breathing trial, will try to extubate her, she is following commands today Vaginal bleeding has significantly improved with Megace  H&H is stable between 9-10 Continue levothyroxine  Diet and exercise counseling as appropriate Increase free water  flushes to 200 mL every 4 hours Continue aggressive electrolyte replacement Failed voiding trial yesterday, started on bethanechol , will give her voiding trial probably tomorrow  Best Practice (right click and Reselect all SmartList Selections daily)   Diet/type: Swallow evaluation postextubation DVT prophylaxis: Bival Pressure ulcer(s): Deferred to nursing notes GI prophylaxis: H2 blocker Lines: Discontinue arterial line, will discontinue today Foley: Failed voiding trial, Foley was reinserted Code Status:  full code. Last date of goals of care discussion: 6/28 patient and her mother were updated at bedside, decision was to continue full scope of care   Labs   CBC: Recent Labs  Lab 10/24/23 1610 10/24/23 1616 10/25/23 0453 10/25/23 0601 10/26/23 0450 10/26/23 0851 10/27/23 0400 10/27/23 1050 10/27/23 1657 10/27/23 2210 10/28/23 0410  WBC 20.8*  --  19.1*  --  18.3*  --  20.6*  --   --   --  23.0*  HGB 8.7*   < > 8.8*   < > 9.2*   < > 10.0* 10.3* 10.3* 9.5* 9.5*  HCT 28.5*   < > 28.8*   < > 29.8*   < > 32.5* 33.8* 34.0* 31.9* 31.2*  MCV 88.2  --  89.2  --  86.4  --  86.9  --   --   --  88.6  PLT 139*  --  157  --  111*  --  59*  --   --   --  58*   < > = values in this interval not displayed.    Basic Metabolic Panel: Recent Labs  Lab 10/22/23 1618 10/22/23 1630 10/23/23 2330  10/24/23 0039 10/25/23 0451 10/25/23 0453 10/25/23 0601 10/25/23 1647 10/25/23 1704 10/26/23 0450 10/26/23 0851 10/26/23 1224 10/26/23 1228 10/27/23 0400 10/27/23 1200 10/28/23 0410  NA 144   < >  --    < >  --  147*   < > 149*   < > 151*   < > 150* 151* 146* 148* 149*  K 4.2   < >  --    < >  --  3.8   < > 3.3*   < > 3.3*   < > 3.2* 3.3* 3.2* 4.4 3.4*  CL 108   < >  --    < >  --  109  --  110  --  110  --  107  --  107 112* 118*  CO2 25   < >  --    < >  --  28  --  30  --  30  --  30  --  26 26 22   GLUCOSE 96   < >  --    < >  --  117*  --  138*  --  109*  --  137*  --  134* 136* 153*  BUN 11   < >  --    < >  --  20  --  18  --  21*  --  22*  --  22* 26* 23*  CREATININE 0.90   < >  --    < >  --  1.09*  --  0.88  --  0.80  --  0.83  --  0.97 1.01* 0.75  CALCIUM  8.0*   < >  --    < >  --  8.9  --  8.9  --  9.4  --  9.4  --  9.4 9.1 9.0  MG 2.1   < >  --    < >  --  2.5*  --  2.4  --  2.4  --   --   --  2.4  --  2.6*  PHOS 4.4  --  3.3  --  5.2*  --   --   --   --  3.5  --   --   --  4.6  --   --    < > = values in this interval not displayed.   GFR: Estimated Creatinine Clearance: 106.8 mL/min (by C-G formula based on SCr of 0.75 mg/dL). Recent Labs  Lab 10/23/23 0950 10/23/23 1114 10/24/23 0631 10/24/23 1610 10/24/23 1626 10/24/23 1907 10/25/23 0453 10/26/23 0450 10/27/23 0400 10/28/23 0410  WBC  --    < >  --    < >  --   --  19.1* 18.3* 20.6* 23.0*  LATICACIDVEN 1.9  --  1.0  --  0.7 0.6  --   --   --   --    < > = values in this interval not displayed.    Liver Function Tests: No results for input(s): AST, ALT, ALKPHOS, BILITOT, PROT, ALBUMIN  in the last 168 hours.  No results for input(s): LIPASE, AMYLASE in the last 168 hours. No results for input(s): AMMONIA in the last 168 hours.  ABG    Component Value Date/Time   PHART 7.512 (H) 10/26/2023 1228   PCO2ART 40.2 10/26/2023 1228   PO2ART 96 10/26/2023 1228   HCO3 32.1 (H) 10/26/2023  1228   TCO2 33 (H) 10/26/2023 1228   ACIDBASEDEF 2.0 10/21/2023 1813   O2SAT 98 10/26/2023 1228     Coagulation Profile: No results for input(s): INR, PROTIME in the last 168 hours.   Cardiac Enzymes: No results for input(s): CKTOTAL, CKMB, CKMBINDEX, TROPONINI in the last 168 hours.  HbA1C: Hgb A1c MFr Bld  Date/Time Value Ref Range Status  10/20/2023 06:43 AM 5.0 4.8 - 5.6 % Final    Comment:    (NOTE) Diagnosis of Diabetes The following HbA1c ranges recommended by the American Diabetes Association (ADA) may be used as an aid in the diagnosis of diabetes mellitus.  Hemoglobin             Suggested A1C NGSP%              Diagnosis  <5.7                   Non Diabetic  5.7-6.4                Pre-Diabetic  >6.4                   Diabetic  <7.0                   Glycemic control for                       adults with diabetes.      CBG: Recent Labs  Lab 10/27/23 1919 10/27/23 2338 10/28/23 0336 10/28/23 0733 10/28/23 0746  GLUCAP 135* 186* 145* 95 141*    The patient is critically ill due to acute bilateral submassive PE/acute respiratory failure/cardiogenic shock status post VA ECMO.  Critical care was necessary to treat or prevent imminent or life-threatening deterioration.  Critical care was time spent personally by me on the following activities: development of treatment plan with patient and/or surrogate as well as nursing, discussions with consultants, evaluation of patient's response to treatment, examination of patient, obtaining history from patient or surrogate, ordering and performing treatments and interventions, ordering and review of laboratory studies, ordering and review of radiographic studies, pulse oximetry, re-evaluation of patient's condition and participation in multidisciplinary rounds.   During this encounter critical care time was devoted to patient care services described in this note for 37 minutes.   Valinda Novas, MD Interior  Pulmonary Critical Care See Amion for pager If no response to pager, please call [phone] until 7pm After 7pm, Please call E-link [phone]

## 2023-10-28 NOTE — Progress Notes (Signed)
 PHARMACY - ANTICOAGULATION CONSULT NOTE  Pharmacy Consult for bivalirudin  > heparin  > argatroban   Indication: pulmonary embolus/acute L DVT/ECMO >> decannulation 7/2  Allergies  Allergen Reactions   Wellbutrin [Bupropion] Hives, Itching and Other (See Comments)    Skin crawling sensation   Heparin      HIT    Patient Measurements: Height: 5' 7 (170.2 cm) Weight: 104.3 kg (229 lb 15 oz) IBW/kg (Calculated) : 61.6 HEPARIN  DW (KG): 86.4  Vital Signs: Temp: 97.8 F (36.6 C) (07/06 1550) Temp Source: Oral (07/06 1550) BP: 134/84 (07/06 1600) Pulse Rate: 77 (07/06 1700)  Labs: Recent Labs    10/25/23 2247 10/26/23 0123 10/26/23 0445 10/26/23 0450 10/26/23 0851 10/27/23 0400 10/27/23 1050 10/27/23 1200 10/27/23 1657 10/28/23 0410 10/28/23 0900 10/28/23 0929 10/28/23 1638  HGB 8.7*   < >  --  9.2*   < > 10.0*   < >  --    < > 9.5*  --  9.3* 9.1*  HCT 28.1*   < >  --  29.8*   < > 32.5*   < >  --    < > 31.2*  --  29.7* 30.1*  PLT  --   --   --  111*  --  59*  --   --   --  58*  --   --   --   APTT  --   --   --   --   --   --   --  44*   < > 47* 46*  --  41*  HEPARINUNFRC 0.44  --  0.34  --   --  0.39  --   --   --   --   --   --   --   CREATININE  --   --   --  0.80   < > 0.97  --  1.01*  --  0.75  --   --   --    < > = values in this interval not displayed.    Estimated Creatinine Clearance: 106.8 mL/min (by C-G formula based on SCr of 0.75 mg/dL).  Assessment: 33 YOF admitted with submassive PE with right heart strain. RV/LV ration 1.4. CT with bilateral PE with severe clot burden. Patient not on anticoagulation prior to hospitalization. Patient also has ongoing vaginal bleeding due to uterine fibroids, noted to be a chronic issue. Pharmacy initially consulted for heparin  dosing. Patient later decompensated, started on ECMO on 6/28 PM.  6/28 PM VA ECMO cannulation + catheter directed alteplase  + uterine artery embolization (hx uterine fibroids); heparin  overnight,  switched to bival 6/29 AM 7/2 VA ECMO decannulation - started on heparin  @1900  7/5 concerns for HIT - changed back to bival.  aPTT remains subtherapeutic, no known infusion issues or bleeding. RN to change IV site just to be safe.  Goal of Therapy:  aPTT 50-80 sec Monitor platelets by anticoagulation protocol: Yes   Plan:  Increase bivalirudin  to 0.09 mg/kg/hr  Check aPTT in 4 hours    Ozell Jamaica, PharmD, BCPS, [facility] Clinical Pharmacist [phone] Please check AMION for all [facility] Pharmacy numbers 10/28/2023

## 2023-10-29 DIAGNOSIS — D649 Anemia, unspecified: Secondary | ICD-10-CM | POA: Insufficient documentation

## 2023-10-29 DIAGNOSIS — D696 Thrombocytopenia, unspecified: Secondary | ICD-10-CM

## 2023-10-29 DIAGNOSIS — D62 Acute posthemorrhagic anemia: Secondary | ICD-10-CM

## 2023-10-29 DIAGNOSIS — R339 Retention of urine, unspecified: Secondary | ICD-10-CM

## 2023-10-29 DIAGNOSIS — N938 Other specified abnormal uterine and vaginal bleeding: Secondary | ICD-10-CM

## 2023-10-29 LAB — BASIC METABOLIC PANEL WITH GFR
Anion gap: 9 (ref 5–15)
Anion gap: 7 (ref 5–15)
BUN: 18 mg/dL (ref 6–20)
BUN: 17 mg/dL (ref 6–20)
CO2: 18 mmol/L — ABNORMAL LOW (ref 22–32)
CO2: 18 mmol/L — ABNORMAL LOW (ref 22–32)
Calcium: 8.7 mg/dL — ABNORMAL LOW (ref 8.9–10.3)
Calcium: 8.5 mg/dL — ABNORMAL LOW (ref 8.9–10.3)
Chloride: 118 mmol/L — ABNORMAL HIGH (ref 98–111)
Chloride: 117 mmol/L — ABNORMAL HIGH (ref 98–111)
Creatinine, Ser: 0.7 mg/dL (ref 0.44–1.00)
Creatinine, Ser: 0.66 mg/dL (ref 0.44–1.00)
GFR, Estimated: >60 mL/min (ref >60)
GFR, Estimated: >60 mL/min (ref >60)
Glucose, Bld: 100 mg/dL — ABNORMAL HIGH (ref 70–99)
Glucose, Bld: 117 mg/dL — ABNORMAL HIGH (ref 70–99)
Potassium: 3.2 mmol/L — ABNORMAL LOW (ref 3.5–5.1)
Potassium: 3.2 mmol/L — ABNORMAL LOW (ref 3.5–5.1)
Sodium: 145 mmol/L (ref 135–145)
Sodium: 142 mmol/L (ref 135–145)

## 2023-10-29 LAB — APTT
aPTT: 52 s — ABNORMAL HIGH (ref 24–36)
aPTT: 54 s — ABNORMAL HIGH (ref 24–36)
aPTT: 50 s — ABNORMAL HIGH (ref 24–36)

## 2023-10-29 LAB — CBC
HCT: 29.4 % — ABNORMAL LOW (ref 36.0–46.0)
Hemoglobin: 8.9 g/dL — ABNORMAL LOW (ref 12.0–15.0)
MCH: 26.6 pg (ref 26.0–34.0)
MCHC: 30.3 g/dL (ref 30.0–36.0)
MCV: 88 fL (ref 80.0–100.0)
Platelets: 81 K/uL — ABNORMAL LOW (ref 150–400)
RBC: 3.34 MIL/uL — ABNORMAL LOW (ref 3.87–5.11)
RDW: 18.6 % — ABNORMAL HIGH (ref 11.5–15.5)
WBC: 21.6 K/uL — ABNORMAL HIGH (ref 4.0–10.5)
nRBC: 0.2 % (ref 0.0–0.2)

## 2023-10-29 LAB — HEMOGLOBIN AND HEMATOCRIT, BLOOD
HCT: 29.2 % — ABNORMAL LOW (ref 36.0–46.0)
Hemoglobin: 9 g/dL — ABNORMAL LOW (ref 12.0–15.0)

## 2023-10-29 LAB — GLUCOSE, CAPILLARY
Glucose-Capillary: 96 mg/dL (ref 70–99)
Glucose-Capillary: 79 mg/dL (ref 70–99)
Glucose-Capillary: 77 mg/dL (ref 70–99)
Glucose-Capillary: 109 mg/dL — ABNORMAL HIGH (ref 70–99)

## 2023-10-29 LAB — LACTIC ACID, PLASMA: Lactic Acid, Venous: 1 mmol/L (ref 0.5–1.9)

## 2023-10-29 LAB — HEPARIN INDUCED PLATELET AB (HIT ANTIBODY): Heparin Induced Plt Ab: 1.014 {OD_unit} — ABNORMAL HIGH (ref 0.000–0.400)

## 2023-10-29 LAB — MAGNESIUM: Magnesium: 2.2 mg/dL (ref 1.7–2.4)

## 2023-10-29 MED ORDER — POTASSIUM CHLORIDE CRYS ER 20 MEQ PO TBCR: 20.0000 meq | EXTENDED_RELEASE_TABLET | ORAL | Status: DC

## 2023-10-29 MED ORDER — ENSURE PLUS HIGH PROTEIN PO LIQD
237.0000 mL | Freq: Two times a day (BID) | ORAL | Status: DC
  Administered 2023-10-30: 237 mL via ORAL

## 2023-10-29 MED ORDER — POTASSIUM CHLORIDE 10 MEQ/50ML IV SOLN
10.0000 meq | INTRAVENOUS | Status: AC
  Administered 2023-10-29 (×4): 10 meq via INTRAVENOUS
  Filled 2023-10-29: qty 50

## 2023-10-29 MED ORDER — POTASSIUM CHLORIDE 10 MEQ/50ML IV SOLN
10.0000 meq | INTRAVENOUS | Status: AC
  Administered 2023-10-29 – 2023-10-30 (×6): 10 meq via INTRAVENOUS
  Filled 2023-10-29 (×4): qty 50

## 2023-10-29 MED ORDER — ADULT MULTIVITAMIN W/MINERALS CH
1.0000 | ORAL_TABLET | Freq: Every day | ORAL | Status: DC
  Administered 2023-10-29 – 2023-11-03 (×6): 1 via ORAL
  Filled 2023-10-29 (×7): qty 1

## 2023-10-29 MED ORDER — POTASSIUM CHLORIDE CRYS ER 20 MEQ PO TBCR: 40.0000 meq | EXTENDED_RELEASE_TABLET | ORAL | Status: DC

## 2023-10-29 MED ORDER — MEGESTROL ACETATE 40 MG PO TABS
40.0000 mg | ORAL_TABLET | Freq: Every day | ORAL | Status: DC
  Administered 2023-10-30 – 2023-11-04 (×6): 40 mg via ORAL
  Filled 2023-10-29 (×6): qty 1

## 2023-10-29 NOTE — Evaluation (Signed)
 Physical Therapy Evaluation Patient Details Name: Anita Aguirre MRN: [id number] DOB: 1975-06-28 Today's Date: 10/29/2023  History of Present Illness  48 y/o female presented 6/28 with worsening SOB and chest pain. Found to have acute bilateral submassive PE with right heart strain, acute DVT involving left popliteal, left posterior tibial and peroneal veins.  Underwent VA ECMO and catheter directed thrombolysis. 7/2 ECMO decannulated. PMH for ADD (attention deficit disorder), Arthritis (04/24/2008), Diffuse cystic mastopathy (04/24/2010), Hypertension, Hypothyroidism,Mastitis and bleeding uterine fibroids.  Clinical Impression  Pt admitted with above diagnosis. Independent PTA, able to do yard work, drive, and worked from home. Lives with her mother who she states is frail. Required mod assist to stand from bed with Stedy, but CGA to rise several times from [facility] paddles. Moderate dyspnea with activity SpO2 97% on RA, HR to 100. Anticipate good progression with function now that she is mobilizing out of bed. Patient will benefit from intensive inpatient follow-up therapy, >3 hours/day. Pt currently with functional limitations due to the deficits listed below (see PT Problem List). Pt will benefit from acute skilled PT to increase their independence and safety with mobility to allow discharge.           If plan is discharge home, recommend the following: A lot of help with bathing/dressing/bathroom;Two people to help with walking and/or transfers;Assistance with cooking/housework;Assist for transportation;Help with stairs or ramp for entrance   Can travel by private vehicle        Equipment Recommendations Rolling walker (2 wheels) (anticipated)  Recommendations for Other Services  Rehab consult    Functional Status Assessment Patient has had a recent decline in their functional status and demonstrates the ability to make significant improvements in function in a reasonable and predictable  amount of time.     Precautions / Restrictions Precautions Precautions: Fall Recall of Precautions/Restrictions: Impaired Precaution/Restrictions Comments: Monitor O2 Restrictions Weight Bearing Restrictions Per Provider Order: No      Mobility  Bed Mobility Overal bed mobility: Needs Assistance Bed Mobility: Supine to Sit     Supine to sit: Mod assist, HOB elevated     General bed mobility comments: Mod assist to mobilize LEs to edge of bed and support trunk to rise. Significant weakness. Cues for technique. Assistance to scoot forward to EOB.    Transfers Overall transfer level: Needs assistance Equipment used: Ambulation equipment used Transfers: Sit to/from Stand, Bed to chair/wheelchair/BSC Sit to Stand: Via lift equipment, Mod assist, From elevated surface           General transfer comment: Mod assist for boost to stand with Stedy from elevated surface. Did not need to block knees on pad as she rose. CGA to stand 2 more times from purched position on [place]. Developed moderate dyspnea with Spo2 97% on RA, and HR to 100. Transfer via Lift Equipment: Stedy  Ambulation/Gait               General Gait Details: Deferred due to weakness  Stairs            Wheelchair Mobility     Tilt Bed    Modified Rankin (Stroke Patients Only)       Balance Overall balance assessment: Needs assistance Sitting-balance support: Bilateral upper extremity supported Sitting balance-Leahy Scale: Poor Sitting balance - Comments: Progressed from min assist to CGA requires UE support at EOB   Standing balance support: Bilateral upper extremity supported Standing balance-Leahy Scale: Poor Standing balance comment: Stands in steady with bil UE supported,  knees unblocked.                             Pertinent Vitals/Pain Pain Assessment Pain Assessment: Faces Faces Pain Scale: Hurts little more Pain Location: Rt wrist (aline) Pain Descriptors /  Indicators: Aching Pain Intervention(s): Monitored during session, Repositioned, Limited activity within patient's tolerance    Home Living Family/patient expects to be discharged to:: Private residence Living Arrangements: Parent Available Help at Discharge: Family;Available 24 hours/day Type of Home: House Home Access: Stairs to enter Entrance Stairs-Rails: None Entrance Stairs-Number of Steps: 1   Home Layout: One level Home Equipment: None Additional Comments: Lives with elderly mother    Prior Function Prior Level of Function : Independent/Modified Independent;Working/employed;Driving             Mobility Comments: ind ADLs Comments: ind, works from home, Publishing rights manager, Mining engineer.     Extremity/Trunk Assessment   Upper Extremity Assessment Upper Extremity Assessment: Defer to OT evaluation    Lower Extremity Assessment Lower Extremity Assessment: Generalized weakness       Communication   Communication Communication: No apparent difficulties    Cognition Arousal: Alert Behavior During Therapy: WFL for tasks assessed/performed   PT - Cognitive impairments: No family/caregiver present to determine baseline                       PT - Cognition Comments: Oriented x4. Following commands: Intact       Cueing Cueing Techniques: Verbal cues, Gestural cues     General Comments      Exercises General Exercises - Lower Extremity Ankle Circles/Pumps: AROM, Both, 10 reps, Supine Quad Sets: Strengthening, Both, 10 reps, Supine, AAROM Gluteal Sets: Strengthening, Both, 10 reps, Supine Long Arc Quad: Strengthening, Both, 10 reps, Seated   Assessment/Plan    PT Assessment Patient needs continued PT services  PT Problem List Decreased strength;Decreased activity tolerance;Decreased balance;Decreased mobility;Decreased knowledge of use of DME;Cardiopulmonary status limiting activity       PT Treatment Interventions DME instruction;Gait training;Stair  training;Functional mobility training;Therapeutic activities;Therapeutic exercise;Balance training;Neuromuscular re-education;Patient/family education    PT Goals (Current goals can be found in the Care Plan section)  Acute Rehab PT Goals Patient Stated Goal: Get stronger go home PT Goal Formulation: With patient Time For Goal Achievement: 11/12/23 Potential to Achieve Goals: Good    Frequency Min 2X/week     Co-evaluation               AM-PAC PT 6 Clicks Mobility  Outcome Measure Help needed turning from your back to your side while in a flat bed without using bedrails?: A Lot Help needed moving from lying on your back to sitting on the side of a flat bed without using bedrails?: A Lot Help needed moving to and from a bed to a chair (including a wheelchair)?: Total Help needed standing up from a chair using your arms (e.g., wheelchair or bedside chair)?: A Lot Help needed to walk in hospital room?: Total Help needed climbing 3-5 steps with a railing? : Total 6 Click Score: 9    End of Session Equipment Utilized During Treatment: Gait belt Activity Tolerance: Patient tolerated treatment well Patient left: in chair;with call bell/phone within reach;with nursing/sitter in room Nurse Communication: Mobility status;Need for lift equipment PT Visit Diagnosis: Unsteadiness on feet (R26.81);Other abnormalities of gait and mobility (R26.89);Muscle weakness (generalized) (M62.81)    Time: 8875-8842 PT Time Calculation (min) (ACUTE ONLY):  33 min   Charges:   PT Evaluation $PT Eval Moderate Complexity: 1 Mod PT Treatments $Therapeutic Activity: 8-22 mins PT General Charges $$ ACUTE PT VISIT: 1 Visit         Leontine Roads, PT, DPT [facility]  Rehabilitation Services Physical Therapist Office: [phone] Website: [email or web address]   Leontine GORMAN Roads 10/29/2023, 12:56 PM

## 2023-10-29 NOTE — Progress Notes (Signed)
 PHARMACY - ANTICOAGULATION CONSULT NOTE  Pharmacy Consult for bivalirudin  Indication: PE/DVT w/ concern for HIT  Labs: Recent Labs    10/26/23 0445 10/26/23 0450 10/26/23 0851 10/27/23 0400 10/27/23 1050 10/27/23 1200 10/27/23 1657 10/28/23 0410 10/28/23 0900 10/28/23 0929 10/28/23 1638 10/28/23 2300  HGB  --  9.2*   < > 10.0*   < >  --    < > 9.5*  --  9.3* 9.1* 9.2*  HCT  --  29.8*   < > 32.5*   < >  --    < > 31.2*  --  29.7* 30.1* 30.5*  PLT  --  111*  --  59*  --   --   --  58*  --   --   --   --   APTT  --   --   --   --   --  44*   < > 47* 46*  --  41* 52*  HEPARINUNFRC 0.34  --   --  0.39  --   --   --   --   --   --   --   --   CREATININE  --  0.80   < > 0.97  --  1.01*  --  0.75  --   --   --   --    < > = values in this interval not displayed.   Assessment/Plan:  48yo female therapeutic on bivalirudin  after rate changes. Will continue infusion at current rate of 0.09 mg/kg/hr and confirm stable with am labs.  Marvetta Dauphin, PharmD, BCPS 10/29/2023 12:16 AM

## 2023-10-29 NOTE — Progress Notes (Signed)

## 2023-10-29 NOTE — Progress Notes (Signed)
 PHARMACY - ANTICOAGULATION CONSULT NOTE  Pharmacy Consult for bivalirudin  > heparin  > argatroban   Indication: pulmonary embolus/acute L DVT/ECMO >> decannulation 7/2  Allergies  Allergen Reactions   Wellbutrin [Bupropion] Hives, Itching and Other (See Comments)    Skin crawling sensation   Heparin      HIT    Patient Measurements: Height: 5' 7 (170.2 cm) Weight: 104.3 kg (229 lb 15 oz) IBW/kg (Calculated) : 61.6 HEPARIN  DW (KG): 86.4  Vital Signs: Temp: 98.1 F (36.7 C) (07/07 0810) Temp Source: Oral (07/07 0810) BP: 144/91 (07/07 0800) Pulse Rate: 75 (07/07 0830)  Labs: Recent Labs    10/27/23 0400 10/27/23 1050 10/27/23 1200 10/27/23 1657 10/28/23 0410 10/28/23 0900 10/28/23 1638 10/28/23 2300 10/29/23 0430  HGB 10.0*   < >  --    < > 9.5*   < > 9.1* 9.2* 8.9*  HCT 32.5*   < >  --    < > 31.2*   < > 30.1* 30.5* 29.4*  PLT 59*  --   --   --  58*  --   --   --  81*  APTT  --   --  44*   < > 47*   < > 41* 52* 54*  HEPARINUNFRC 0.39  --   --   --   --   --   --   --   --   CREATININE 0.97  --  1.01*  --  0.75  --   --   --  0.70   < > = values in this interval not displayed.    Estimated Creatinine Clearance: 106.8 mL/min (by C-G formula based on SCr of 0.7 mg/dL).  Assessment: 27 YOF admitted with submassive PE with right heart strain. RV/LV ration 1.4. CT with bilateral PE with severe clot burden. Patient not on anticoagulation prior to hospitalization. Patient also has ongoing vaginal bleeding due to uterine fibroids, noted to be a chronic issue. Pharmacy initially consulted for heparin  dosing. Patient later decompensated, started on ECMO on 6/28 PM.  6/28 PM VA ECMO cannulation + catheter directed alteplase  + uterine artery embolization (hx uterine fibroids); heparin  overnight, switched to bival 6/29 AM 7/2 VA ECMO decannulation - started on heparin  @1900  7/5 concerns for HIT - changed back to bival.  aPTT is now therapeutic at 54, on bivalirudin  at 0.09  mg/kg/hr. No known infusion issues (oozing at A-line but stable) or bleeding. Hgb 8.9, plt increasing up to 81. Given clot burden - will trial megace  daily > watch vaginal bleeding.   Goal of Therapy:  aPTT 50-80 sec Monitor platelets by anticoagulation protocol: Yes   Plan:  Continue bivalirudin  at 0.09 mg/kg/hr  Check aPTT every 12 hours while therapeutic, CBC, and for s/sx of bleeding   Thank you for allowing pharmacy to participate in this patient's care,  Suzen Sour, PharmD, BCCCP Clinical Pharmacist  Phone: [phone] 10/29/2023 11:07 AM  Please check AMION for all [facility] Pharmacy phone numbers After 10:00 PM, call Main Pharmacy [phone]

## 2023-10-29 NOTE — Progress Notes (Signed)
 eLink Physician-Brief Progress Note Patient Name: Anita Aguirre DOB: 1975/07/18 MRN: [id number]   Date of Service  10/29/2023  HPI/Events of Note  5pm K+ 3.2    Dr. Gaskins ordered POK+ and pt refusing to take big tabs    has c-line  eICU Interventions  Potassium replacement ordered, anemia stable. LA just ordered. Follow up LA level.      Intervention Category Intermediate Interventions: Electrolyte abnormality - evaluation and management  Anita Aguirre 10/29/2023, 7:33 PM

## 2023-10-29 NOTE — Progress Notes (Signed)
 NAME:  Anita Aguirre, MRN:  [id number], DOB:  1976-01-16, LOS: 9 ADMISSION DATE:  10/20/2023, CONSULTATION DATE:  10/20/2023 REFERRING MD:  Dr Isadora, CHIEF COMPLAINT:  SOB/Chest pain  History of Present Illness:  48 y/o female with PMH for ADD (attention deficit disorder), Arthritis (04/24/2008), Diffuse cystic mastopathy (04/24/2010), Hypertension, Hypothyroidism,Mastitis and bleeding uterine fibroids who presented to [facility] with worsening SOB and chest pain for last 2 days worsening day of admission to hospital  Chest pain dull and not radiating.  She was found to have submassive Pulmonary embolism with right heart strain.  Her Troponin increased to 409, Lactic Acid down to 2.7 from 3.9. CT scan chest revealed, Bilateral pulmonary emboli with severe clot burden. 2. CT evidence of RIGHT ventricular strain. Positive for acute PE with CT evidence of right heart strain (RV/LV Ratio = 1.4) consistent with at least submassive (intermediate risk) PE. The presence of right heart strain has been associated with an increased risk of morbidity and mortality. Please refer to the Code PE Focused order set in EPIC. 3. Small pulmonary infarction in the RIGHT lower lobe. 4. Moderate hiatal hernia.  Patient says she has no previous h/o blood clots but does have a h/o spontaneous abortion.  She works from home and has a sit down job and her life style is very sedentary.  No family h/o Pulmonary emboli. At [facility] she was started on Heparin  drip and Milrinone  drip.  She is feeling better, currently not on pressors.  Case was d/w IR by Dr Isadora and she was not a good candidate for clot extraction but maybe a better case for catheter directed Thrombolysis despite have continually bleeding for several years, She says she has been having uterine bleeding since 2013 when she had a miscarriage.  Her uterine bleeding apparently from Fibroids has worsened over the last several months and she was being  considered for Hysterectomy at [facility] but has not made it there yet. No N/V/D, no Fever/chills, no recent flights or leg trauma.  She did c/o left calf pain over the last few days and increase size of left ankle. Denies LOC. Pertinent  Medical History  ADD (attention deficit disorder), Arthritis (04/24/2008), Diffuse cystic mastopathy (04/24/2010), Hypertension, Hypothyroidism, and Mastitis.   Significant Hospital Events: Including procedures, antibiotic start and stop dates in addition to other pertinent events   6/28: transfer from [place].  Underwent VA ECMO and catheter directed thrombolysis 6/29 remain on VA ECMO, lysis catheter removed by IR.  Continue to have vaginal bleeding/clots 6/30 ECMO flow was decreased to 3 L, patient tolerated well, bedside echocardiogram showed mildly reduced RV function compared to prior.  Continued to have vaginal bleeding/clots 7/2 ECMO decannulated 7/4 milrinone  was stopped, off vasopressor support 7/5 tolerating spontaneous breathing trial but agitated and restless 7/7 stable on RA, off pressors   Interim History / Subjective:  Fever curve improving and WBC slowly down-trending States menstrual bleeding has slowed  No pressor requirement   Objective    Blood pressure (!) 144/91, pulse 75, temperature 98.1 F (36.7 C), temperature source Oral, resp. rate 20, height 5' 7 (1.702 m), weight 104.3 kg, SpO2 97%. CVP:  [0 mmHg-13 mmHg] 9 mmHg      Intake/Output Summary (Last 24 hours) at 10/29/2023 1004 Last data filed at 10/29/2023 0800 Gross per 24 hour  Intake 3011.49 ml  Output 1415 ml  Net 1596.49 ml   Filed Weights   10/26/23 0457 10/27/23 0407 10/28/23 0431  Weight: 107.6 kg  103.9 kg 104.3 kg    General:  well nourished F resting in bed in NAD HEENT: MM pink/moist Neuro: alert and oriented and moving all extremities  CV: s1s2 rrr, no m/r/g PULM:  clear bilaterally without distress on [place]  GI: soft, non-tender Extremities: warm/dry, no  edema, c/o bilateral leg pain but nontender to palpation      Resolved problem list  Lactic acidosis SIRS response from ECMO cannulation, improved Acute kidney injury due to cardiorenal syndrome, improving hypophosphatemia Constipation Intolerant of tube feeds Acute RV failure with cardiogenic shock status post VA ECMO on 6/28 -7/2  Assessment and Plan  Acute bilateral submassive PE with right heart strain status post catheter directed thrombolysis Acute DVT involving left popliteal, left posterior tibial and peroneal veins Thrombocytopenia, unlikely 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, monitor PTT Doppler ultrasound of lower extremity is suggestive of acute DVT HIT antibodies are in process Platelets slightly improved to 81  Acute respiratory failure with hypoxia Right upper lobe lung infarction Now extubated and stable on RA    Vaginal bleeding status post uterine artery embolization, continue to bleed Acute blood loss anemia vaginal bleeding Hgb stable 8.9 today Vaginal bleeding improved, decrease Megace  to 40mg  qd   Hypothyroidism Obesity Continue levothyroxine  Diet and exercise counseling as appropriate    Hypokalemia/Hypernatremia Acute urinary retention Na improved to 145 Continue aggressive electrolyte replacement Failed voiding trial and was started on bethanechol , voiding trial later today or tomorrow   Best Practice (right click and Reselect all SmartList Selections daily)   Diet/type: regular  DVT prophylaxis: Bival Pressure ulcer(s): Deferred to nursing notes GI prophylaxis: n/a Lines: Discontinue arterial line, Foley: Failed voiding trial, Foley was reinserted Code Status:  full code. Last date of goals of care discussion: 7/7 discussed plan with patient   Labs   CBC: Recent Labs  Lab 10/25/23 0453 10/25/23 0601 10/26/23 0450 10/26/23 0851 10/27/23 0400 10/27/23 1050 10/28/23 0410 10/28/23 0929 10/28/23 1638 10/28/23 2300 10/29/23 0430  WBC  19.1*  --  18.3*  --  20.6*  --  23.0*  --   --   --  21.6*  HGB 8.8*   < > 9.2*   < > 10.0*   < > 9.5* 9.3* 9.1* 9.2* 8.9*  HCT 28.8*   < > 29.8*   < > 32.5*   < > 31.2* 29.7* 30.1* 30.5* 29.4*  MCV 89.2  --  86.4  --  86.9  --  88.6  --   --   --  88.0  PLT 157  --  111*  --  59*  --  58*  --   --   --  81*   < > = values in this interval not displayed.    Basic Metabolic Panel: Recent Labs  Lab 10/22/23 1618 10/22/23 1630 10/23/23 2330 10/24/23 0039 10/25/23 0451 10/25/23 0453 10/25/23 1647 10/25/23 1704 10/26/23 0450 10/26/23 0851 10/26/23 1224 10/26/23 1228 10/27/23 0400 10/27/23 1200 10/28/23 0410 10/29/23 0430  NA 144   < >  --    < >  --    < > 149*   < > 151*   < > 150* 151* 146* 148* 149* 145  K 4.2   < >  --    < >  --    < > 3.3*   < > 3.3*   < > 3.2* 3.3* 3.2* 4.4 3.4* 3.2*  CL 108   < >  --    < >  --    < >  110  --  110  --  107  --  107 112* 118* 118*  CO2 25   < >  --    < >  --    < > 30  --  30  --  30  --  26 26 22  18*  GLUCOSE 96   < >  --    < >  --    < > 138*  --  109*  --  137*  --  134* 136* 153* 100*  BUN 11   < >  --    < >  --    < > 18  --  21*  --  22*  --  22* 26* 23* 18  CREATININE 0.90   < >  --    < >  --    < > 0.88  --  0.80  --  0.83  --  0.97 1.01* 0.75 0.70  CALCIUM  8.0*   < >  --    < >  --    < > 8.9  --  9.4  --  9.4  --  9.4 9.1 9.0 8.7*  MG 2.1   < >  --    < >  --    < > 2.4  --  2.4  --   --   --  2.4  --  2.6* 2.2  PHOS 4.4  --  3.3  --  5.2*  --   --   --  3.5  --   --   --  4.6  --   --   --    < > = values in this interval not displayed.   GFR: Estimated Creatinine Clearance: 106.8 mL/min (by C-G formula based on SCr of 0.7 mg/dL). Recent Labs  Lab 10/23/23 0950 10/23/23 1114 10/24/23 0631 10/24/23 1610 10/24/23 1626 10/24/23 1907 10/25/23 0453 10/26/23 0450 10/27/23 0400 10/28/23 0410 10/29/23 0430  WBC  --    < >  --    < >  --   --    < > 18.3* 20.6* 23.0* 21.6*  LATICACIDVEN 1.9  --  1.0  --  0.7 0.6  --    --   --   --   --    < > = values in this interval not displayed.    Liver Function Tests: No results for input(s): AST, ALT, ALKPHOS, BILITOT, PROT, ALBUMIN  in the last 168 hours.  No results for input(s): LIPASE, AMYLASE in the last 168 hours. No results for input(s): AMMONIA in the last 168 hours.  ABG    Component Value Date/Time   PHART 7.512 (H) 10/26/2023 1228   PCO2ART 40.2 10/26/2023 1228   PO2ART 96 10/26/2023 1228   HCO3 32.1 (H) 10/26/2023 1228   TCO2 33 (H) 10/26/2023 1228   ACIDBASEDEF 2.0 10/21/2023 1813   O2SAT 98 10/26/2023 1228     Coagulation Profile: No results for input(s): INR, PROTIME in the last 168 hours.   Cardiac Enzymes: No results for input(s): CKTOTAL, CKMB, CKMBINDEX, TROPONINI in the last 168 hours.  HbA1C: Hgb A1c MFr Bld  Date/Time Value Ref Range Status  10/20/2023 06:43 AM 5.0 4.8 - 5.6 % Final    Comment:    (NOTE) Diagnosis of Diabetes The following HbA1c ranges recommended by the American Diabetes Association (ADA) may be used as an aid in the diagnosis of diabetes mellitus.  Hemoglobin  Suggested A1C NGSP%              Diagnosis  <5.7                   Non Diabetic  5.7-6.4                Pre-Diabetic  >6.4                   Diabetic  <7.0                   Glycemic control for                       adults with diabetes.      CBG: Recent Labs  Lab 10/28/23 1121 10/28/23 1549 10/28/23 1941 10/28/23 2228 10/29/23 0631  GLUCAP 96 108* 83 89 96     Shea Kapur R Dakotah Orrego, PA-C [facility] See Amion for pager If no response to pager , please call [phone] until 7pm After 7:00 pm call Elink  [phone]

## 2023-10-29 NOTE — Progress Notes (Signed)
 [facility] ADULT ICU REPLACEMENT PROTOCOL   The patient does apply for the [facility] Adult ICU Electrolyte Replacment Protocol based on the criteria listed below:   1.Exclusion criteria: TCTS, ECMO, Dialysis, and Myasthenia Gravis patients 2. Is GFR >/= 30 ml/min? Yes.    Patient's GFR today is >60 3. Is SCr </= 2? Yes.   Patient's SCr is 0.70 mg/dL 4. Did SCr increase >/= 0.5 in 24 hours? No. 5.Pt's weight >40kg  Yes.   6. Abnormal electrolyte(s): K+ 3.2  7. Electrolytes replaced per protocol 8.  Call MD STAT for K+ </= 2.5, Phos </= 1, or Mag </= 1 Physician:  Rada Henry Recardo LELON 10/29/2023 5:53 AM

## 2023-10-29 NOTE — Progress Notes (Signed)
 Nutrition Follow-up  DOCUMENTATION CODES:   Not applicable  INTERVENTION:  Add Ensure Plus High Protein po BID, each supplement provides 350 kcal and 20 grams of protein   Add Magic cup BID with meals, each supplement provides 290 kcal and 9 grams of protein   Add MVI w/ minerals   Assistance with meal ordering to tailor food preferences  Monitor need for bowel regimen, she is inconsistent at baseline  NUTRITION DIAGNOSIS:  Inadequate oral intake related to inability to eat as evidenced by NPO status.  GOAL:  Patient will meet greater than or equal to 90% of their needs  MONITOR:  Vent status, TF tolerance, I & O's, Skin, Labs  REASON FOR ASSESSMENT:  Consult Enteral/tube feeding initiation and management  ASSESSMENT:   Pt with PMH significant for: ADD, arthritis, HTN, hypothyroidism, cystic mastopathy, mastitis and bleeding of uterine fibroids. Presented to [facility] with c/o worsening SOB and chest pain x2 days. Found to have pulmonary embolism w/ R heart strain. Not a good candidate for clot extraction. Transferred to [facility] and underwent VA ECMO and catheter directed thrombolysis.  6/28 transferred to [facility], 3 units PRBCs transfused; ECMO initiated 6/29 intubated; TFs initiated 6/30 still advancing TFs, tPA completed and catheter removed; ECHO: LV EF 40-45%; RV systolic function severely reduced 7/01 NPO r/t vomiting and c/f aspiration 7/02 ECMO decannulation 7/03 Cortrak placed; trickles re-initiated  7/04 Off pressors, off milrinone ; TFs titrated up, FMS placed 7/05 agitated and restless overnight, remains off vasopressors and inotropes, FMS removed 7/06 extubated, Cortrak removed; advanced to clear liquid and then regular diet   Pt now extubated and Cortrak removed. Awake and alert lying in bed this morning. Willing to engage. No family present at bedside.  Able to complete nutrition interview.   24 Hour Recall B: skips L: burger and fries w/ diet soda D: protein,  starch, veg w/ diet soda   Now advanced to regular texture diet. Documented with 25% intake x1 meal on 7/6. She reports not much of an appetite. Discussed ONS, which she is amicable to receiving to augment intake. Will add Magic Cup as well. Discussed the need for calories and protein to be able to progress and work with therapies. She verbalized understanding.   Admit weight: 106.2kg  Current weight: 104.3 kg  Bowels moving. FMS removed. No BMs as of yet today. She states bowels are inconsistent at baseline. Reports UBW around 230lbs. Still above that with presence of edema. Will continue to assess as edema resolves and also recommend repeat nutrition focused physical exam to assess for muscle and fat wasting being masked by fluid.    Drains/Lines: RIJ: CVC triple lumen L groin: wound vac: 0ml documented x24 hours Foley catheter UOP: x24 hours  Pt not amicable to KCl tablet, so receiving intravenously. Lasix  has been stopped. Continues with some urinary retention issues. On bethanechol  chloride. Voiding trials starting today or tomorrow. Sodium and BUN have decreased to within range. WBCs remain elevated, however trending down.  Labs: Sodium 851>850>854 (wdl) Cl 118 (H) Potassium 3.2 (L) BUN 18 WBCs 20.6>23.0>21.6 (H) CBGs 100-153 x24 hours A1c 5.0 (09/2023)   Meds: Megace  (per OB/Gyn) famotidine  SS novolog  levothyroxine  KCL (received lots of runs of potasium this admission)  Diet Order:   Diet Order             Diet Heart Room service appropriate? Yes; Fluid consistency: Thin  Diet effective now             EDUCATION  NEEDS:  Not appropriate for education at this time  Skin:  Skin Assessment: Skin Integrity Issues: Skin Integrity Issues:: Incisions Incisions: Groin-closed, wound VAC in place  Last BM:  7/6 - type 6 x1  Height:  Ht Readings from Last 1 Encounters:  10/25/23 5' 7 (1.702 m)   Weight:  Wt Readings from Last 1 Encounters:  10/28/23 104.3 kg    Ideal Body Weight:  61.4 kg  BMI:  Body mass index is 36.01 kg/m.  Estimated Nutritional Needs:   Kcal:  1850-2050 kcals  Protein:  95-110g  Fluid:  >1.9L/day  Blair Deaner MS, RD, LDN Registered Dietitian Clinical Nutrition RD Inpatient Contact Info in Amion

## 2023-10-29 NOTE — Progress Notes (Signed)
 PHARMACY - ANTICOAGULATION CONSULT NOTE  Pharmacy Consult for bivalirudin  > heparin  > argatroban   Indication: pulmonary embolus/acute L DVT/ECMO >> decannulation 7/2  Allergies  Allergen Reactions   Wellbutrin [Bupropion] Hives, Itching and Other (See Comments)    Skin crawling sensation   Heparin      HIT    Patient Measurements: Height: 5' 7 (170.2 cm) Weight: 104.3 kg (229 lb 15 oz) IBW/kg (Calculated) : 61.6 HEPARIN  DW (KG): 86.4  Vital Signs: Temp: 98.7 F (37.1 C) (07/07 1651) Temp Source: Oral (07/07 1651) BP: 132/65 (07/07 1500) Pulse Rate: 87 (07/07 1700)  Labs: Recent Labs    10/27/23 0400 10/27/23 1050 10/28/23 0410 10/28/23 0900 10/28/23 2300 10/29/23 0430 10/29/23 1700 10/29/23 1708  HGB 10.0*   < > 9.5*   < > 9.2* 8.9* 9.0*  --   HCT 32.5*   < > 31.2*   < > 30.5* 29.4* 29.2*  --   PLT 59*  --  58*  --   --  81*  --   --   APTT  --    < > 47*   < > 52* 54* 50*  --   HEPARINUNFRC 0.39  --   --   --   --   --   --   --   CREATININE 0.97   < > 0.75  --   --  0.70  --  0.66   < > = values in this interval not displayed.    Estimated Creatinine Clearance: 106.8 mL/min (by C-G formula based on SCr of 0.66 mg/dL).  Assessment: 37 YOF admitted with submassive PE with right heart strain. RV/LV ration 1.4. CT with bilateral PE with severe clot burden. Patient not on anticoagulation prior to hospitalization. Patient also has ongoing vaginal bleeding due to uterine fibroids, noted to be a chronic issue. Pharmacy initially consulted for heparin  dosing. Patient later decompensated, started on ECMO on 6/28 PM.  6/28 PM VA ECMO cannulation + catheter directed alteplase  + uterine artery embolization (hx uterine fibroids); heparin  overnight, switched to bival 6/29 AM 7/2 VA ECMO decannulation - started on heparin  @1900  7/5 concerns for HIT - changed back to bival.  aPTT is at low end of therapeutic at 50, on bivalirudin  at 0.09 mg/kg/hr. HIT antibody at 1.014 is  positive.  Hgb stable.  No issues with infusion and had one large vaginal clot with minimal bleeding per RN. Pt is on megace .  Goal of Therapy:  aPTT 50-80 sec Monitor platelets by anticoagulation protocol: Yes   Plan:  Increase bivalirudin  to 0.095 mg/kg/hr to keep in goal Check aPTT every 12 hours while therapeutic, CBC, and for s/sx of bleeding   Thank you for allowing pharmacy to participate in this patient's care,  Jinnie Door, PharmD, BCPS, [facility] Clinical Pharmacist  Please check AMION for all [facility] Pharmacy phone numbers After 10:00 PM, call Main Pharmacy [phone]

## 2023-10-30 ENCOUNTER — Inpatient Hospital Stay (HOSPITAL_COMMUNITY): Payer: Self-pay

## 2023-10-30 ENCOUNTER — Telehealth: Payer: Self-pay | Admitting: Critical Care Medicine

## 2023-10-30 DIAGNOSIS — R7881 Bacteremia: Secondary | ICD-10-CM

## 2023-10-30 DIAGNOSIS — D62 Acute posthemorrhagic anemia: Secondary | ICD-10-CM

## 2023-10-30 DIAGNOSIS — I2699 Other pulmonary embolism without acute cor pulmonale: Secondary | ICD-10-CM

## 2023-10-30 LAB — BASIC METABOLIC PANEL WITH GFR
Anion gap: 8 (ref 5–15)
BUN: 14 mg/dL (ref 6–20)
CO2: 19 mmol/L — ABNORMAL LOW (ref 22–32)
Calcium: 8.5 mg/dL — ABNORMAL LOW (ref 8.9–10.3)
Chloride: 115 mmol/L — ABNORMAL HIGH (ref 98–111)
Creatinine, Ser: 0.63 mg/dL (ref 0.44–1.00)
GFR, Estimated: >60 mL/min (ref >60)
Glucose, Bld: 105 mg/dL — ABNORMAL HIGH (ref 70–99)
Potassium: 3.6 mmol/L (ref 3.5–5.1)
Sodium: 142 mmol/L (ref 135–145)

## 2023-10-30 LAB — APTT: aPTT: 55 s — ABNORMAL HIGH (ref 24–36)

## 2023-10-30 LAB — CBC
HCT: 29.2 % — ABNORMAL LOW (ref 36.0–46.0)
Hemoglobin: 8.9 g/dL — ABNORMAL LOW (ref 12.0–15.0)
MCH: 26.5 pg (ref 26.0–34.0)
MCHC: 30.5 g/dL (ref 30.0–36.0)
MCV: 86.9 fL (ref 80.0–100.0)
Platelets: 119 K/uL — ABNORMAL LOW (ref 150–400)
RBC: 3.36 MIL/uL — ABNORMAL LOW (ref 3.87–5.11)
RDW: 18.6 % — ABNORMAL HIGH (ref 11.5–15.5)
WBC: 20.8 K/uL — ABNORMAL HIGH (ref 4.0–10.5)
nRBC: 0.2 % (ref 0.0–0.2)

## 2023-10-30 LAB — ECHOCARDIOGRAM COMPLETE
AR max vel: 3.06 cm2
AV Area VTI: 3.25 cm2
AV Area mean vel: 3.14 cm2
AV Mean grad: 6 mmHg
AV Peak grad: 11.2 mmHg
Ao pk vel: 1.67 m/s
Area-P 1/2: 2.99 cm2
Height: 67 in
S' Lateral: 3.1 cm
Weight: 3739 [oz_av]

## 2023-10-30 LAB — HEMOGLOBIN AND HEMATOCRIT, BLOOD
HCT: 30.5 % — ABNORMAL LOW (ref 36.0–46.0)
Hemoglobin: 9.5 g/dL — ABNORMAL LOW (ref 12.0–15.0)

## 2023-10-30 LAB — MAGNESIUM: Magnesium: 2.1 mg/dL (ref 1.7–2.4)

## 2023-10-30 LAB — VANCOMYCIN, PEAK: Vancomycin Pk: >60 ug/mL (ref 30–40)

## 2023-10-30 LAB — GLUCOSE, CAPILLARY
Glucose-Capillary: 98 mg/dL (ref 70–99)
Glucose-Capillary: 154 mg/dL — ABNORMAL HIGH (ref 70–99)
Glucose-Capillary: 111 mg/dL — ABNORMAL HIGH (ref 70–99)
Glucose-Capillary: 111 mg/dL — ABNORMAL HIGH (ref 70–99)

## 2023-10-30 LAB — VANCOMYCIN, TROUGH: Vancomycin Tr: 8 ug/mL — ABNORMAL LOW (ref 15–20)

## 2023-10-30 MED ORDER — APIXABAN 5 MG PO TABS
5.0000 mg | ORAL_TABLET | Freq: Two times a day (BID) | ORAL | Status: DC
  Administered 2023-10-30 – 2023-11-04 (×11): 5 mg via ORAL
  Filled 2023-10-30 (×11): qty 1

## 2023-10-30 MED ORDER — VANCOMYCIN HCL IN DEXTROSE 1-5 GM/200ML-% IV SOLN
1000.0000 mg | Freq: Two times a day (BID) | INTRAVENOUS | Status: DC
  Administered 2023-10-31 – 2023-11-04 (×10): 1000 mg via INTRAVENOUS
  Filled 2023-10-30 (×12): qty 200

## 2023-10-30 MED ORDER — VANCOMYCIN VARIABLE DOSE PER UNSTABLE RENAL FUNCTION (PHARMACIST DOSING): Status: DC

## 2023-10-30 MED ORDER — POTASSIUM CHLORIDE CRYS ER 20 MEQ PO TBCR: 40.0000 meq | EXTENDED_RELEASE_TABLET | Freq: Once | ORAL | Status: DC

## 2023-10-30 MED ORDER — POTASSIUM CHLORIDE 10 MEQ/50ML IV SOLN
10.0000 meq | INTRAVENOUS | Status: AC
  Administered 2023-10-30 (×4): 10 meq via INTRAVENOUS
  Filled 2023-10-30 (×4): qty 50

## 2023-10-30 MED ORDER — POTASSIUM CHLORIDE CRYS ER 20 MEQ PO TBCR
20.0000 meq | EXTENDED_RELEASE_TABLET | Freq: Once | ORAL | Status: DC
  Filled 2023-10-30: qty 1

## 2023-10-30 MED ORDER — POTASSIUM CHLORIDE CRYS ER 20 MEQ PO TBCR
20.0000 meq | EXTENDED_RELEASE_TABLET | Freq: Once | ORAL | Status: AC
  Administered 2023-10-30: 20 meq via ORAL
  Filled 2023-10-30: qty 1

## 2023-10-30 NOTE — Progress Notes (Addendum)
 NAME:  Anita Aguirre, MRN:  [id number], DOB:  04/16/1976, LOS: 10 ADMISSION DATE:  10/20/2023, CONSULTATION DATE:  10/20/2023 REFERRING MD:  Dr Isadora, CHIEF COMPLAINT:  SOB/Chest pain  History of Present Illness:  48 y/o female with PMH for ADD (attention deficit disorder), Arthritis (04/24/2008), Diffuse cystic mastopathy (04/24/2010), Hypertension, Hypothyroidism,Mastitis and bleeding uterine fibroids who presented to [facility] with worsening SOB and chest pain for last 2 days worsening day of admission to hospital  Chest pain dull and not radiating.  She was found to have submassive Pulmonary embolism with right heart strain.  Her Troponin increased to 409, Lactic Acid down to 2.7 from 3.9. CT scan chest revealed, Bilateral pulmonary emboli with severe clot burden. 2. CT evidence of RIGHT ventricular strain. Positive for acute PE with CT evidence of right heart strain (RV/LV Ratio = 1.4) consistent with at least submassive (intermediate risk) PE. The presence of right heart strain has been associated with an increased risk of morbidity and mortality. Please refer to the Code PE Focused order set in EPIC. 3. Small pulmonary infarction in the RIGHT lower lobe. 4. Moderate hiatal hernia.  Patient says she has no previous h/o blood clots but does have a h/o spontaneous abortion.  She works from home and has a sit down job and her life style is very sedentary.  No family h/o Pulmonary emboli. At [facility] she was started on Heparin  drip and Milrinone  drip.  She is feeling better, currently not on pressors.  Case was d/w IR by Dr Isadora and she was not a good candidate for clot extraction but maybe a better case for catheter directed Thrombolysis despite have continually bleeding for several years, She says she has been having uterine bleeding since 2013 when she had a miscarriage.  Her uterine bleeding apparently from Fibroids has worsened over the last several months and she was being  considered for Hysterectomy at [facility] but has not made it there yet. No N/V/D, no Fever/chills, no recent flights or leg trauma.  She did c/o left calf pain over the last few days and increase size of left ankle. Denies LOC. Pertinent  Medical History  ADD (attention deficit disorder), Arthritis (04/24/2008), Diffuse cystic mastopathy (04/24/2010), Hypertension, Hypothyroidism, and Mastitis.   Significant Hospital Events: Including procedures, antibiotic start and stop dates in addition to other pertinent events   6/28: transfer from [place].  Underwent VA ECMO and catheter directed thrombolysis 6/29 remain on VA ECMO, lysis catheter removed by IR.  Continue to have vaginal bleeding/clots 6/30 ECMO flow was decreased to 3 L, patient tolerated well, bedside echocardiogram showed mildly reduced RV function compared to prior.  Continued to have vaginal bleeding/clots 7/2 ECMO decannulated 7/4 milrinone  was stopped, off vasopressor support 7/5 tolerating spontaneous breathing trial but agitated and restless 7/7 stable on RA, off pressors   Interim History / Subjective:  Megace  decreased yesterday, menstrual bleeding compared to baseline continues to improve    Objective    Blood pressure 125/67, pulse 76, temperature 98.7 F (37.1 C), temperature source Oral, resp. rate 20, height 5' 7 (1.702 m), weight 106 kg, SpO2 100%. CVP:  [6 mmHg-9 mmHg] 9 mmHg      Intake/Output Summary (Last 24 hours) at 10/30/2023 0840 Last data filed at 10/30/2023 0800 Gross per 24 hour  Intake 1571.66 ml  Output 2265 ml  Net -693.34 ml   Filed Weights   10/27/23 0407 10/28/23 0431 10/30/23 0700  Weight: 103.9 kg 104.3 kg 106 kg  General:  well nourished F resting in bed in NAD HEENT: MM pink/moist Neuro: alert and oriented and moving all extremities  CV: s1s2 rrr, no m/r/g PULM:  clear bilaterally without distress on [employer]  GI: soft, non-tender Extremities: warm/dry, no edema, c/o bilateral leg pain but  nontender to palpation      Resolved problem list  Lactic acidosis SIRS response from ECMO cannulation, improved Acute kidney injury due to cardiorenal syndrome, improving hypophosphatemia Constipation Intolerant of tube feeds Acute RV failure with cardiogenic shock status post VA ECMO on 6/28 -7/2  Assessment and Plan     Acute bilateral submassive PE with right heart strain status post catheter directed thrombolysis Acute DVT involving left popliteal, left posterior tibial and peroneal veins Thrombocytopenia, unlikely HIT S/P VA ECMO Transition to DOAC, pharmacy consult to assist with transition from Bival to Eliquis  Doppler ultrasound of lower extremity is suggestive of acute DVT HIT antibodies are elevated, but timing less suggestive of HIT Platelets continue to up-trend  Per vascular note continue Prevena for one week (7/10)  Acute respiratory failure with hypoxia Right upper lobe lung infarction Now extubated and stable on RA    Vaginal bleeding status post uterine artery embolization, continue to bleed Acute blood loss anemia vaginal bleeding Hgb stable  Vaginal bleeding improved, continue Megace  to 40mg  every day Follows with [facility] outpatient    Hypothyroidism Obesity Continue levothyroxine  Diet and exercise counseling as appropriate    Hypokalemia/Hypernatremia Acute urinary retention Na improved to 145 Continue aggressive electrolyte replacement Failed voiding trial and was started on bethanechol , voiding trial today  Best Practice (right click and Reselect all SmartList Selections daily)   Diet/type: regular  DVT prophylaxis: Bival, transition to DOAC Pressure ulcer(s): Deferred to nursing notes GI prophylaxis: n/a Lines: CVC Foley: remove foley today Code Status:  full code. Last date of goals of care discussion: 7/8 discussed plan with patient   Labs   CBC: Recent Labs  Lab 10/26/23 0450 10/26/23 0851 10/27/23 0400 10/27/23 1050  10/28/23 0410 10/28/23 0929 10/28/23 1638 10/28/23 2300 10/29/23 0430 10/29/23 1700 10/30/23 0456  WBC 18.3*  --  20.6*  --  23.0*  --   --   --  21.6*  --  20.8*  HGB 9.2*   < > 10.0*   < > 9.5*   < > 9.1* 9.2* 8.9* 9.0* 8.9*  HCT 29.8*   < > 32.5*   < > 31.2*   < > 30.1* 30.5* 29.4* 29.2* 29.2*  MCV 86.4  --  86.9  --  88.6  --   --   --  88.0  --  86.9  PLT 111*  --  59*  --  58*  --   --   --  81*  --  119*   < > = values in this interval not displayed.    Basic Metabolic Panel: Recent Labs  Lab 10/23/23 2330 10/24/23 0039 10/25/23 0451 10/25/23 0453 10/26/23 0450 10/26/23 0851 10/27/23 0400 10/27/23 1200 10/28/23 0410 10/29/23 0430 10/29/23 1708 10/30/23 0456  NA  --    < >  --    < > 151*   < > 146* 148* 149* 145 142 142  K  --    < >  --    < > 3.3*   < > 3.2* 4.4 3.4* 3.2* 3.2* 3.6  CL  --    < >  --    < > 110   < > 107 112*  118* 118* 117* 115*  CO2  --    < >  --    < > 30   < > 26 26 22  18* 18* 19*  GLUCOSE  --    < >  --    < > 109*   < > 134* 136* 153* 100* 117* 105*  BUN  --    < >  --    < > 21*   < > 22* 26* 23* 18 17 14   CREATININE  --    < >  --    < > 0.80   < > 0.97 1.01* 0.75 0.70 0.66 0.63  CALCIUM   --    < >  --    < > 9.4   < > 9.4 9.1 9.0 8.7* 8.5* 8.5*  MG  --    < >  --    < > 2.4  --  2.4  --  2.6* 2.2  --  2.1  PHOS 3.3  --  5.2*  --  3.5  --  4.6  --   --   --   --   --    < > = values in this interval not displayed.   GFR: Estimated Creatinine Clearance: 107.8 mL/min (by C-G formula based on SCr of 0.63 mg/dL). Recent Labs  Lab 10/24/23 0631 10/24/23 1610 10/24/23 1626 10/24/23 1907 10/25/23 0453 10/27/23 0400 10/28/23 0410 10/29/23 0430 10/29/23 2017 10/30/23 0456  WBC  --    < >  --   --    < > 20.6* 23.0* 21.6*  --  20.8*  LATICACIDVEN 1.0  --  0.7 0.6  --   --   --   --  1.0  --    < > = values in this interval not displayed.    Liver Function Tests: No results for input(s): AST, ALT, ALKPHOS, BILITOT, PROT,  ALBUMIN  in the last 168 hours.  No results for input(s): LIPASE, AMYLASE in the last 168 hours. No results for input(s): AMMONIA in the last 168 hours.  ABG    Component Value Date/Time   PHART 7.512 (H) 10/26/2023 1228   PCO2ART 40.2 10/26/2023 1228   PO2ART 96 10/26/2023 1228   HCO3 32.1 (H) 10/26/2023 1228   TCO2 33 (H) 10/26/2023 1228   ACIDBASEDEF 2.0 10/21/2023 1813   O2SAT 98 10/26/2023 1228     Coagulation Profile: No results for input(s): INR, PROTIME in the last 168 hours.   Cardiac Enzymes: No results for input(s): CKTOTAL, CKMB, CKMBINDEX, TROPONINI in the last 168 hours.  HbA1C: Hgb A1c MFr Bld  Date/Time Value Ref Range Status  10/20/2023 06:43 AM 5.0 4.8 - 5.6 % Final    Comment:    (NOTE) Diagnosis of Diabetes The following HbA1c ranges recommended by the American Diabetes Association (ADA) may be used as an aid in the diagnosis of diabetes mellitus.  Hemoglobin             Suggested A1C NGSP%              Diagnosis  <5.7                   Non Diabetic  5.7-6.4                Pre-Diabetic  >6.4                   Diabetic  <7.0  Glycemic control for                       adults with diabetes.      CBG: Recent Labs  Lab 10/29/23 0631 10/29/23 1119 10/29/23 1650 10/29/23 2116 10/30/23 0626  GLUCAP 96 79 77 109* 98     Leita R Severn Goddard, PA-C [facility] See Amion for pager If no response to pager , please call [phone] until 7pm After 7:00 pm call Elink  [phone]

## 2023-10-30 NOTE — Progress Notes (Signed)
 [facility] ADULT ICU REPLACEMENT PROTOCOL   The patient does apply for the [facility] Adult ICU Electrolyte Replacment Protocol based on the criteria listed below:   1.Exclusion criteria: TCTS, ECMO, Dialysis, and Myasthenia Gravis patients 2. Is GFR >/= 30 ml/min? Yes.    Patient's GFR today is >60 3. Is SCr </= 2? Yes.   Patient's SCr is 0.63 mg/dL 4. Did SCr increase >/= 0.5 in 24 hours? No. 5.Pt's weight >40kg  Yes.   6. Abnormal electrolyte(s): K+ 3.6  7. Electrolytes replaced per protocol 8.  Call MD STAT for K+ </= 2.5, Phos </= 1, or Mag </= 1 Physician:  Rada Henry Recardo LELON 10/30/2023 6:18 AM

## 2023-10-30 NOTE — Plan of Care (Signed)

## 2023-10-30 NOTE — Progress Notes (Addendum)
 Pharmacy Antibiotic Note  Anita Aguirre is a 48 y.o. female admitted on 10/20/2023 with PE s/p ECMO cannulation. Pt spiking fevers overnight with leukocytosis.   7/6 Received BCID Call : pt growing 2/4 bottles (different sets) GPCs in clusters identified as staph epi with mecA on BCID. Discussed with Damien Fava, MD and decision made to start vancomycin .  Patient received a dose of vancomycin  750 mg IV 7/5 2352. Patient appears to be hemodynamically stable for the time being. Will omit load.   7/8 Vanc peak came back >60. D/w RN and appears to have been drawn appropriately.   Plan: Will d/c scheduled vancomycin  for now and f/u trough level this evening for further dosing vs more levels.   ADDENDUM (2210): Vancomycin  trough 8 mcg/ml (this seems more accurate than the peak that is >60 - think that must have been a lab error).  Will restart vancomycin  1000mg  IV q12h. Recheck levels in next couple of days if continues.  Height: 5' 7 (170.2 cm) Weight: 106 kg (233 lb 11 oz) IBW/kg (Calculated) : 61.6  Temp (24hrs), Avg:98.9 F (37.2 C), Min:98.3 F (36.8 C), Max:100.3 F (37.9 C)  Recent Labs  Lab 10/24/23 0631 10/24/23 1610 10/24/23 1626 10/24/23 1907 10/25/23 0453 10/26/23 0450 10/26/23 1224 10/27/23 0400 10/27/23 1200 10/28/23 0410 10/29/23 0430 10/29/23 1708 10/29/23 2017 10/30/23 0456 10/30/23 1343  WBC  --    < >  --   --    < > 18.3*  --  20.6*  --  23.0* 21.6*  --   --  20.8*  --   CREATININE  --    < >  --   --    < > 0.80   < > 0.97 1.01* 0.75 0.70 0.66  --  0.63  --   LATICACIDVEN 1.0  --  0.7 0.6  --   --   --   --   --   --   --   --  1.0  --   --   VANCOPEAK  --   --   --   --   --   --   --   --   --   --   --   --   --   --  >60*   < > = values in this interval not displayed.    Estimated Creatinine Clearance: 107.8 mL/min (by C-G formula based on SCr of 0.63 mg/dL).    Allergies  Allergen Reactions   Wellbutrin [Bupropion] Hives, Itching and Other  (See Comments)    Skin crawling sensation   Heparin      HIT   Antibiotics this admission: Merrem  6/28 > 7/2 Vancomycin  6/28 > 7/2, 7/6 >> Cefepime  7/5 >> 7/7   Microbiology: 6/28 MRSA neg 7/1 BAL: neg 7/5 Bcx: 3/4 GPC in clusters (BCID staph epi)   Vito Ralph, PharmD, BCPS Please see amion for complete clinical pharmacist phone list 10/30/2023 3:26 PM

## 2023-10-30 NOTE — TOC Progression Note (Signed)
 Transition of Care [facility]) - Progression Note    Patient Details  Name: Anita Aguirre MRN: [id number] Date of Birth: 1975/12/17  Transition of Care [facility]) CM/SW Contact  Graves-Bigelow, Erminio Deems, RN Phone Number: 10/30/2023, 3:04 PM  Clinical Narrative:  Patient presented for shortness of breath and chest pain. PTA patient reports that she is from home with her mother. Patient states she works from home with [employer] for 10 years and is unable to afford the insurance. Case Manager was notified from Inpatient Rehab Liaison of a workers comp case. Case Manager called the number with the patient present the information is in her account notes (phone [phone] and claim [id number]). The information provided to the Case Manager is for a leave of absence from her job. Financial Counselor was re notified to resume the Medicaid Screen for this patient. FMLA paperwork to be signed by providers per staff RN. Case Manager will continue to follow for additional transition of care needs as she progresses.      Expected Discharge Plan: IP Rehab Facility Barriers to Discharge: Continued Medical Work up  Expected Discharge Plan and Services In-house Referral: Artist Discharge Planning Services: CM Consult Post Acute Care Choice: IP Rehab Living arrangements for the past 2 months: Single Family Home                   DME Agency: NA  Social Determinants of Health (SDOH) Interventions SDOH Screenings   Food Insecurity: Food Insecurity Present (10/20/2023)  Housing: High Risk (10/20/2023)  Transportation Needs: Unmet Transportation Needs (10/20/2023)  Utilities: Not At Risk (10/20/2023)  Social Connections: Unknown (10/20/2023)  Tobacco Use: Medium Risk (10/24/2023)    Readmission Risk Interventions     No data to display

## 2023-10-30 NOTE — Consult Note (Signed)
 WOC Nurse Consult Note: Reason for Consult: R groin wound ECMO site  Wound type:full thickness r/t above  Pressure Injury POA: na  Measurement: see nursing flowsheet  Wound bed: 100% tan fibrinous  Drainage (amount, consistency, odor) see nursing flowsheet  Periwound: stitched area with ecchymosis beneath  Dressing procedure/placement/frequency: Cleanse R groin post ECMO site with Vashe wound cleanser Soila [phone]), do not rinse and allow to air dry.  Apply Xeroform gauze ([place]son [phone]) to wound bed daily and secure with ABD pad or silicone foam whichever is preferred.   POC discussed with bedside nurse. WOC team will not follow.  Re-consult if further needs arise.   Thank you,    Powell Bar MSN, RN-BC, [employer]

## 2023-10-30 NOTE — Telephone Encounter (Signed)
 OP Pulmonology follow up requested in about 1 month post-hospitalization at [facility] office.  Leita SHAUNNA Gaskins, DO 10/30/23 5:49 PM [facility]

## 2023-10-30 NOTE — Progress Notes (Signed)
*  PRELIMINARY RESULTS* Echocardiogram 2D Echocardiogram has been performed.  Anita Aguirre Stallion 10/30/2023, 10:11 AM

## 2023-10-30 NOTE — Progress Notes (Addendum)
 PHARMACY - ANTICOAGULATION CONSULT NOTE  Pharmacy Consult for bivalirudin  > heparin  > argatroban   Indication: pulmonary embolus/acute L DVT/ECMO >> decannulation 7/2  Allergies  Allergen Reactions   Wellbutrin [Bupropion] Hives, Itching and Other (See Comments)    Skin crawling sensation   Heparin      HIT    Patient Measurements: Height: 5' 7 (170.2 cm) Weight: 104.3 kg (229 lb 15 oz) IBW/kg (Calculated) : 61.6 HEPARIN  DW (KG): 86.4  Vital Signs: Temp: 100.3 F (37.9 C) (07/08 0400) Temp Source: Axillary (07/08 0400) BP: 139/84 (07/08 0700) Pulse Rate: 74 (07/08 0700)  Labs: Recent Labs    10/28/23 0410 10/28/23 0900 10/29/23 0430 10/29/23 1700 10/29/23 1708 10/30/23 0456  HGB 9.5*   < > 8.9* 9.0*  --  8.9*  HCT 31.2*   < > 29.4* 29.2*  --  29.2*  PLT 58*  --  81*  --   --  119*  APTT 47*   < > 54* 50*  --  55*  CREATININE 0.75  --  0.70  --  0.66 0.63   < > = values in this interval not displayed.    Estimated Creatinine Clearance: 106.8 mL/min (by C-G formula based on SCr of 0.63 mg/dL).  Assessment: 67 YOF admitted with submassive PE with right heart strain. RV/LV ration 1.4. CT with bilateral PE with severe clot burden. Patient not on anticoagulation prior to hospitalization. Patient also has ongoing vaginal bleeding due to uterine fibroids, noted to be a chronic issue. Pharmacy initially consulted for heparin  dosing. Patient later decompensated, started on ECMO on 6/28 PM.  6/28 PM VA ECMO cannulation + catheter directed alteplase  + uterine artery embolization (hx uterine fibroids); heparin  overnight, switched to bival 6/29 AM 7/2 VA ECMO decannulation - started on heparin  @1900  7/5 concerns for HIT - changed back to bival. 7/7 HIT Ab has resulted as positive, SRA pending.  aPTT is therapeutic at 55 seconds, CBC stable. Ongoing vaginal bleeding.  Goal of Therapy:  aPTT 50-80 sec Monitor platelets by anticoagulation protocol: Yes   Plan:  Continue  bivalirudin  at 0.095 mg/kg/hr  Check aPTT every 12 hours, CBC, and for s/sx of bleeding   ADDENDUM transitioning to apixaban . Will defer load given pt has received parenteral AC for >7 days and pltc is recovering.   Ozell Jamaica, PharmD, BCPS, [facility] Clinical Pharmacist [phone] Please check AMION for all [facility] Pharmacy numbers 10/30/2023

## 2023-10-30 NOTE — Progress Notes (Addendum)
 Inpatient Rehab Coordinator Note:  I met with patient at bedside to discuss CIR recommendations and goals/expectations of CIR stay.  We reviewed 3 hrs/day of therapy, physician follow up, and average length of stay 2 weeks (dependent upon progress) with goals of supervision.  She lives with her mom who is in her 41's, retired, and able bodied.  We discussed workers comp and I provided TOC with the number in her account to check on the status of that claim.  If they accept the claim we will need their approval for CIR, if not she would like to be rescreened for medicaid.   Reche Lowers, PT, DPT Admissions Coordinator [phone] 10/30/23  2:06 PM

## 2023-10-31 ENCOUNTER — Other Ambulatory Visit (HOSPITAL_COMMUNITY): Payer: Self-pay

## 2023-10-31 DIAGNOSIS — I159 Secondary hypertension, unspecified: Secondary | ICD-10-CM

## 2023-10-31 DIAGNOSIS — E870 Hyperosmolality and hypernatremia: Secondary | ICD-10-CM

## 2023-10-31 DIAGNOSIS — E66812 Obesity, class 2: Secondary | ICD-10-CM

## 2023-10-31 LAB — BASIC METABOLIC PANEL WITH GFR
Anion gap: 11 (ref 5–15)
BUN: 12 mg/dL (ref 6–20)
CO2: 18 mmol/L — ABNORMAL LOW (ref 22–32)
Calcium: 9.1 mg/dL (ref 8.9–10.3)
Chloride: 111 mmol/L (ref 98–111)
Creatinine, Ser: 0.75 mg/dL (ref 0.44–1.00)
GFR, Estimated: >60 mL/min (ref >60)
Glucose, Bld: 120 mg/dL — ABNORMAL HIGH (ref 70–99)
Potassium: 4.5 mmol/L (ref 3.5–5.1)
Sodium: 140 mmol/L (ref 135–145)

## 2023-10-31 LAB — GLUCOSE, CAPILLARY
Glucose-Capillary: 100 mg/dL — ABNORMAL HIGH (ref 70–99)
Glucose-Capillary: 80 mg/dL (ref 70–99)
Glucose-Capillary: 104 mg/dL — ABNORMAL HIGH (ref 70–99)
Glucose-Capillary: 87 mg/dL (ref 70–99)

## 2023-10-31 LAB — CBC
HCT: 30.3 % — ABNORMAL LOW (ref 36.0–46.0)
Hemoglobin: 9.5 g/dL — ABNORMAL LOW (ref 12.0–15.0)
MCH: 27.1 pg (ref 26.0–34.0)
MCHC: 31.4 g/dL (ref 30.0–36.0)
MCV: 86.6 fL (ref 80.0–100.0)
Platelets: 165 K/uL (ref 150–400)
RBC: 3.5 MIL/uL — ABNORMAL LOW (ref 3.87–5.11)
RDW: 19.3 % — ABNORMAL HIGH (ref 11.5–15.5)
WBC: 22.7 K/uL — ABNORMAL HIGH (ref 4.0–10.5)
nRBC: 0.3 % — ABNORMAL HIGH (ref 0.0–0.2)

## 2023-10-31 LAB — MAGNESIUM: Magnesium: 2 mg/dL (ref 1.7–2.4)

## 2023-10-31 NOTE — Progress Notes (Signed)
 New +1 pitting edema in right foot, notified Arrien, MD. Foot is warm and dry, with full sensation and palpable pedal pulses.

## 2023-10-31 NOTE — Assessment & Plan Note (Signed)
 Calculated BMI is 36.

## 2023-10-31 NOTE — Discharge Instructions (Addendum)
 Information on my medicine - ELIQUIS  (apixaban )  Why was Eliquis  prescribed for you? Eliquis  was prescribed to treat blood clots that may have been found in the veins of your legs (deep vein thrombosis) or in your lungs (pulmonary embolism) and to reduce the risk of them occurring again.  What do You need to know about Eliquis  ? The dose ONE 5 mg tablet taken TWICE daily.  Eliquis  may be taken with or without food.   Try to take the dose about the same time in the morning and in the evening. If you have difficulty swallowing the tablet whole please discuss with your pharmacist how to take the medication safely.  Take Eliquis  exactly as prescribed and DO NOT stop taking Eliquis  without talking to the doctor who prescribed the medication.  Stopping may increase your risk of developing a new blood clot.  Refill your prescription before you run out.  After discharge, you should have regular check-up appointments with your healthcare provider that is prescribing your Eliquis .    What do you do if you miss a dose? If a dose of ELIQUIS  is not taken at the scheduled time, take it as soon as possible on the same day and twice-daily administration should be resumed. The dose should not be doubled to make up for a missed dose.  Important Safety Information A possible side effect of Eliquis  is bleeding. You should call your healthcare provider right away if you experience any of the following: Bleeding from an injury or your nose that does not stop. Unusual colored urine (red or dark brown) or unusual colored stools (red or black). Unusual bruising for unknown reasons. A serious fall or if you hit your head (even if there is no bleeding).  Some medicines may interact with Eliquis  and might increase your risk of bleeding or clotting while on Eliquis . To help avoid this, consult your healthcare provider or pharmacist prior to using any new prescription or non-prescription medications, including  herbals, vitamins, non-steroidal anti-inflammatory drugs (NSAIDs) and supplements.  This website has more information on Eliquis  (apixaban ): [email or web address] .[email or web address] /home          Outpatient Substance Use Treatment Services   [facility] Intensive Outpatient Program [address] [place], [place] [address]  [phone] Private insurance, Medicare A&B, and [facility]   [facility] (Alcohol and Drug Services)  [address],  [place], [place] [address] [phone] Medicaid, Self Pay   [facility]      [address]  [place], [place] [phone] Medicaid and [facility], Self Pay   [facility] [address]  [place], [place]  [phone] [facility], and Self Pay  [facility] [place]      [address]    [place], [place] [address]  [phone] or [phone] Private Insurance Only                 [facility] [address]  [place], [place]  [phone] Medicaid, Medicare, Private Insurance  [facility] at [facility] [address]  [place], [place] [address] [phone] Services are free or reduced  [facility]  [address] [phone]  Self Pay only, sliding scale  [facility]  [address]  [place], [place] [address] [phone] ([facility] [employer] [facility]) Self Pay, Medicaid Only   [facility] [address], [place] [address] [phone] Medicaid,  Medicare, Private Insurance                     Adolescent Substance Use Treatment Services    [facility] [address]  [place], [place]  [phone] Self Pay Offer scholarships from the [facility] to help pay for treatment  Website: http://www.lambert.com/  [facility] Adolescent Substance use  Program Males ages: 27-17 Adolescent Substance use Program Females: 12-17  [facility] [address]  [place], [place] [address] (ph) [phone]  (fax) [phone]  [facility]  [address]  [place], [place] [address] (ph) [phone]  (fax) [phone]  [facility] [address]  [place], [place] [address] (ph) [phone]  (fax) [phone]  [facility] [address], [place], [place] [address] (ph) [phone]   (fax) [phone]  Website: [email or web address].[email or web address]/         [facility] for Adolescents Phone: [phone] Address: [address], [place], [place] Website: https://wilkins.info/    Residential Substance Use Engineer, materials ([facility]  [address]  [place], [place] [address]  [phone] or [phone] Detox (Medicare, Medicaid, private insurance, and self pay)  Residential Rehab 14 days (Medicare, Medicaid, private insurance, and self pay)   [facility] ([facility]idential Treatment Services)  [address] [place], [place]  [phone]  Female and Female Detox (Self Pay and Medicaid limited availability)  Rehab only Female (Medicaid and self pay only)   [facility] [address]      [address]  [place], [place] [address]  [phone] or [phone] Detox and Residential Treatment Private Insurance Only   [facility]  [address]  [place], [place] [address]  [phone]  Treatment Only, must make assessment appointment, and must be sober for assessment appointment.  Self Pay Only, Medicare A&B, [facility], [place] ID only! *Transportation assistance offered from [place] on [employer]     [address] [place], [place] [address] Walk in interviews  M-Sat 8-4p No pending legal charges [phone]     [facility]ATC:  [facility] Referral  [address] [place], [place] [phone] (Self Pay, [facility])  [facility] [address] [place], [place] [address] [phone] Detox and Residential Treatment Medicare and Private Insurance  [facility] [address]  [place], [place] [address] 28 Day Women's Facility: [phone] 28 Day Men's Facility: [phone] Long-term Residential Program:  [phone] Males 25 and Over (No Insurance, upfront fee)  [facility]  [address] [place], [place] [address] [phone] Private Insurance with [employer], Private Pay  [facility] [address] [place], [place] [address] Local [phone] Private Insurance Only  [facility] [address]  [place], [place] [address]  [phone] (Males, upfront fee)  [facility] [address]  [place], [address] [phone] Private Insurance      [facility] Locations  [facility]  [address]  [place], [place]  [phone] [facility] Based Program for individuals experiencing homelessness Self Pay, No insurance  [facility]  Men's program: [facility] [address] [place], [place] [address] [phone]  [facility] Women's program: [facility] [address] [place], [place] [address] [phone] Christian Based Program for individuals experiencing homelessness Self Pay, No insurance  [facility] [address] [place] [address] [place], [place] [address]  [phone] Christian Based Program for individuals experiencing homelessness Self Pay, No insurance  [facility] [address] [place], [place] [address] [phone] [facility] Based Program for individuals experiencing homelessness Self Pay, No insurance  [facility] [address] [place], [place] [phone] [facility]  Based Program for males experiencing homelessness Self Pay, No insurance

## 2023-10-31 NOTE — Progress Notes (Signed)
 CSW added outpatient substance use treatment services resources to patients AVS.

## 2023-10-31 NOTE — Telephone Encounter (Signed)
 LM for PT to call for appt.

## 2023-10-31 NOTE — Assessment & Plan Note (Signed)
 Hypokalemia.   Renal function stable with serum cr at 0,72, K is 3,6 and serum bicarbonate at 18  Na 139 Mg 2.0   Foley removed.

## 2023-10-31 NOTE — Progress Notes (Addendum)
 Physical Therapy Treatment Patient Details Name: Anita Aguirre MRN: [id number] DOB: 07/05/1975 Today's Date: 10/31/2023   History of Present Illness 48 y/o female presented 6/28 with worsening SOB and chest pain. Found to have acute bilateral submassive PE with right heart strain, acute DVT involving left popliteal, left posterior tibial and peroneal veins.  Underwent [facility] ECMO and catheter directed thrombolysis. 7/2 ECMO decannulated. PMH for ADD (attention deficit disorder), Arthritis (04/24/2008), Diffuse cystic mastopathy (04/24/2010), Hypertension, Hypothyroidism,Mastitis and bleeding uterine fibroids.    PT Comments  The pt was able to make great progress with mobility, activity tolerance, and endurance this session. She remains limited in endurance and balance compared to her baseline as she was able to ambulate only 200 ft and needed minA at times to correct LOB and increased sway with gait. However, pt is much closer to her functional baseline and therefore anticipate continued progress towards being able to return home with light assist from her mother once medically stable.    If plan is discharge home, recommend the following: Assistance with cooking/housework;Assist for transportation;Help with stairs or ramp for entrance;A little help with walking and/or transfers;A little help with bathing/dressing/bathroom   Can travel by private vehicle        Equipment Recommendations  Rolling walker (2 wheels)    Recommendations for Other Services       Precautions / Restrictions Precautions Precautions: Fall Recall of Precautions/Restrictions: Intact Precaution/Restrictions Comments: Monitor O2 Restrictions Weight Bearing Restrictions Per Provider Order: No     Mobility  Bed Mobility Overal bed mobility: Modified Independent             General bed mobility comments: pt sitting EOB at start of session, cues for safety and line management returning to supine but no assist     Transfers Overall transfer level: Needs assistance Equipment used: None Transfers: Sit to/from Stand, Bed to chair/wheelchair/BSC Sit to Stand: Supervision   Step pivot transfers: Contact guard assist       General transfer comment: CGA initially, supervision by end of session to rise to standing. good stability in stance.    Ambulation/Gait Ambulation/Gait assistance: Contact guard assist, Min assist Gait Distance (Feet): 200 Feet Assistive device: None Gait Pattern/deviations: Step-through pattern, Decreased stride length, Drifts right/left, Staggering left Gait velocity: decreased Gait velocity interpretation: <1.31 ft/sec, indicative of household ambulator   General Gait Details: pt with intermittent drift, lean needing minA to correct. VSS     Balance Overall balance assessment: Needs assistance Sitting-balance support: No upper extremity supported, Feet supported Sitting balance-Leahy Scale: Normal Sitting balance - Comments: no UE support, sitting EOB upon arrival   Standing balance support: No upper extremity supported, During functional activity Standing balance-Leahy Scale: Poor Standing balance comment: increased sway and needing to miNA to steady with gait                            Communication Communication Communication: No apparent difficulties  Cognition Arousal: Alert Behavior During Therapy: WFL for tasks assessed/performed   PT - Cognitive impairments: No family/caregiver present to determine baseline                       PT - Cognition Comments: Oriented x4. Following commands: Intact      Cueing Cueing Techniques: Verbal cues, Gestural cues  Exercises      General Comments General comments (skin integrity, edema, etc.): VSS on RA  Pertinent Vitals/Pain Pain Assessment Pain Assessment: No/denies pain Faces Pain Scale: No hurt Pain Intervention(s): Monitored during session    Home Living Family/patient  expects to be discharged to:: Private residence Living Arrangements: Parent Available Help at Discharge: Family;Available 24 hours/day Type of Home: House Home Access: Stairs to enter Entrance Stairs-Rails: None Entrance Stairs-Number of Steps: 1   Home Layout: One level Home Equipment: Grab bars - tub/shower Additional Comments: Lives with elderly mother    Prior Function            PT Goals (current goals can now be found in the care plan section) Acute Rehab PT Goals Patient Stated Goal: Get stronger go home PT Goal Formulation: With patient Time For Goal Achievement: 11/12/23 Potential to Achieve Goals: Good Progress towards PT goals: Progressing toward goals    Frequency    Min 2X/week       Co-evaluation   Reason for Co-Treatment: To address functional/ADL transfers;For patient/therapist safety   OT goals addressed during session: ADL's and self-care      AM-PAC PT 6 Clicks Mobility   Outcome Measure  Help needed turning from your back to your side while in a flat bed without using bedrails?: None Help needed moving from lying on your back to sitting on the side of a flat bed without using bedrails?: None Help needed moving to and from a bed to a chair (including a wheelchair)?: A Little Help needed standing up from a chair using your arms (e.g., wheelchair or bedside chair)?: A Little Help needed to walk in hospital room?: A Little Help needed climbing 3-5 steps with a railing? : A Lot 6 Click Score: 19    End of Session Equipment Utilized During Treatment: Gait belt Activity Tolerance: Patient tolerated treatment well Patient left: in bed;with call bell/phone within reach;with bed alarm set Nurse Communication: Mobility status PT Visit Diagnosis: Unsteadiness on feet (R26.81);Other abnormalities of gait and mobility (R26.89);Muscle weakness (generalized) (M62.81)     Time: 9042-8982 PT Time Calculation (min) (ACUTE ONLY): 20 min  Charges:     $Therapeutic Exercise: 8-22 mins PT General Charges $$ ACUTE PT VISIT: 1 Visit                     Izetta Call, PT, DPT   Acute Rehabilitation Department Office [phone] Secure Chat Communication Preferred   Izetta JULIANNA Call 10/31/2023, 12:34 PM

## 2023-10-31 NOTE — Telephone Encounter (Signed)
Sent MYCHART message  as well

## 2023-10-31 NOTE — Consult Note (Signed)
 WOC Nurse Consult Note: Reason for Consult:Left inguinal ECMO site with incisional VAC in place.  Loses seal with activity and has been alarming a lot.  I am able to secure drape to get a good seal.   Right inguinal ECMO site with faint erythema and fibrin to proximal area.  Continue Xeroform and dry gauze daily.  Wound type: surgical Pressure Injury POA:NA Measurement:  LEft:  VAC IN Place Right - 2 open areas, suture in distal wound, no suture in proximal wound and thin fibrin to wound bed.  Faint erythema around open areas.  Wound bed:See above.  Drainage (amount, consistency, odor) Scant, serosanguinous  no odor Periwound: intact faint erythema to right side.  Dressing procedure/placement/frequency: Continue Xeroform gauze.  May consider removing incisional VAC due to frequent loss of seal.  No effluent in canister.  Will not follow at this time.  Please re-consult if needed.  Darice Cooley MSN, RN, FNP-BC CWON Wound, Ostomy, Continence Nurse Outpatient [facility] [phone] Pager [phone]

## 2023-10-31 NOTE — Progress Notes (Addendum)
 Inpatient Rehab Admissions Coordinator:   Spoke with pt on the phone regarding rehab coverage.  We reviewed different possibilities ranging from self pay to medicaid to commercial/marketplace options.  She is still waiting to hear from [employer] for medicaid screen but agreeable to transition to rehab when medically cleared and bed available.  I will reach out to medical team today to see when they feel she will be ready.   12:15 notified by OT of updated recommendations to no f/u and PT updating to OP vs HH.  Pt in agreement and TOC aware.  Can likely discharge directly home once medically cleared.   Reche Lowers, PT, DPT Admissions Coordinator [phone] 10/31/23  11:22 AM

## 2023-10-31 NOTE — Assessment & Plan Note (Signed)
 Continue with levothyroxine

## 2023-10-31 NOTE — Assessment & Plan Note (Signed)
 Continue blood pressure monitoring  Continue blood pressure control with losartan .

## 2023-10-31 NOTE — Evaluation (Signed)
 Occupational Therapy Evaluation Patient Details Name: Anita Aguirre MRN: [id number] DOB: 01-Mar-1976 Today's Date: 10/31/2023   History of Present Illness   48 y/o female presented 6/28 with worsening SOB and chest pain. Found to have acute bilateral submassive PE with right heart strain, acute DVT involving left popliteal, left posterior tibial and peroneal veins.  Underwent VA ECMO and catheter directed thrombolysis. 7/2 ECMO decannulated. PMH for ADD (attention deficit disorder), Arthritis (04/24/2008), Diffuse cystic mastopathy (04/24/2010), Hypertension, Hypothyroidism,Mastitis and bleeding uterine fibroids.     Clinical Impressions Pt admitted based on above, and was seen based on problem list below. PTA pt was independent with ADLs and IADLs. Today pt is requiring set up  to CGA for ADLs. Bed mobility was mod I and functional transfers are  CGA without an AD. Pt with no c/o of SOB, VSS on RA during mobility. Noted balance deficits during mobility, and is close to functional baseline with ADLs. No follow up OT needs, however pt would benefit from continued OT in the acute setting for education on energy conservation strategies and to improve activity tolerance.       If plan is discharge home, recommend the following:   A little help with walking and/or transfers;Assistance with cooking/housework     Functional Status Assessment   Patient has had a recent decline in their functional status and demonstrates the ability to make significant improvements in function in a reasonable and predictable amount of time.     Equipment Recommendations   BSC/3in1      Precautions/Restrictions   Precautions Precautions: Fall Recall of Precautions/Restrictions: Intact Precaution/Restrictions Comments: Monitor O2 Restrictions Weight Bearing Restrictions Per Provider Order: No     Mobility Bed Mobility Overal bed mobility: Modified Independent             General bed  mobility comments: No use of bed features    Transfers Overall transfer level: Needs assistance Equipment used: None Transfers: Sit to/from Stand, Bed to chair/wheelchair/BSC Sit to Stand: Supervision     Step pivot transfers: Contact guard assist     General transfer comment: CGA for hallway mobility, decreased balance without UE support      Balance Overall balance assessment: Needs assistance Sitting-balance support: No upper extremity supported, Feet supported Sitting balance-Leahy Scale: Normal     Standing balance support: No upper extremity supported, During functional activity Standing balance-Leahy Scale: Poor Standing balance comment: slight sway with mobility                           ADL either performed or assessed with clinical judgement   ADL Overall ADL's : Needs assistance/impaired Eating/Feeding: Set up;Sitting   Grooming: Supervision/safety;Standing           Upper Body Dressing : Sitting;Set up   Lower Body Dressing: Supervision/safety;Sit to/from stand   Toilet Transfer: Ambulation;Contact guard Engineer, manufacturing Details (indicate cue type and reason): No AD, CGA for balance Toileting- Clothing Manipulation and Hygiene: Supervision/safety;Sit to/from stand       Functional mobility during ADLs: Contact guard assist;Supervision/safety General ADL Comments: Pt with good ROM and strength, balance deficits noted     Vision Baseline Vision/History: 1 Wears glasses Patient Visual Report: No change from baseline Vision Assessment?: No apparent visual deficits            Pertinent Vitals/Pain Pain Assessment Pain Assessment: No/denies pain Pain Intervention(s): Monitored during session     Extremity/Trunk Assessment Upper Extremity Assessment  Upper Extremity Assessment: Overall WFL for tasks assessed   Lower Extremity Assessment Lower Extremity Assessment: Defer to PT evaluation   Cervical / Trunk  Assessment Cervical / Trunk Assessment: Normal   Communication Communication Communication: No apparent difficulties   Cognition Arousal: Alert Behavior During Therapy: WFL for tasks assessed/performed Cognition: No apparent impairments       Following commands: Intact       Cueing  General Comments   Cueing Techniques: Verbal cues;Gestural cues  VSS on RA           Home Living Family/patient expects to be discharged to:: Private residence Living Arrangements: Parent Available Help at Discharge: Family;Available 24 hours/day Type of Home: House Home Access: Stairs to enter [employer] of Steps: 1 Entrance Stairs-Rails: None Home Layout: One level     Bathroom Shower/Tub: Chief Strategy Officer: Standard Bathroom Accessibility: No   Home Equipment: Grab bars - tub/shower   Additional Comments: Lives with elderly mother      Prior Functioning/Environment Prior Level of Function : Independent/Modified Independent;Working/employed;Driving       ADLs Comments: ind, works from home, Publishing rights manager, Mining engineer.    OT Problem List: Decreased activity tolerance;Impaired balance (sitting and/or standing);Cardiopulmonary status limiting activity   OT Treatment/Interventions: Self-care/ADL training;Therapeutic exercise;Energy conservation;DME and/or AE instruction      OT Goals(Current goals can be found in the care plan section)   Acute Rehab OT Goals Patient Stated Goal: To keep progressing OT Goal Formulation: With patient Time For Goal Achievement: 11/14/23 Potential to Achieve Goals: Good   OT Frequency:  Min 2X/week    Co-evaluation PT/OT/SLP Co-Evaluation/Treatment: Yes Reason for Co-Treatment: To address functional/ADL transfers;For patient/therapist safety   OT goals addressed during session: ADL's and self-care      AM-PAC OT 6 Clicks Daily Activity     Outcome Measure Help from another person eating meals?: None Help from  another person taking care of personal grooming?: None Help from another person toileting, which includes using toliet, bedpan, or urinal?: A Little Help from another person bathing (including washing, rinsing, drying)?: A Little Help from another person to put on and taking off regular upper body clothing?: None Help from another person to put on and taking off regular lower body clothing?: A Little 6 Click Score: 21   End of Session Equipment Utilized During Treatment: Gait belt Nurse Communication: Mobility status  Activity Tolerance: Patient tolerated treatment well Patient left: in bed;with call bell/phone within reach;with nursing/sitter in room  OT Visit Diagnosis: Unsteadiness on feet (R26.81);Other abnormalities of gait and mobility (R26.89)                Time: 8980-8942 OT Time Calculation (min): 38 min Charges:  OT General Charges $OT Visit: 1 Visit OT Evaluation $OT Eval Moderate Complexity: 1 Mod  Kayleana Waites C, OT  Acute Rehabilitation Services Office [phone] Secure chat preferred   Adrianne GORMAN Savers 10/31/2023, 12:11 PM

## 2023-10-31 NOTE — Progress Notes (Signed)
 Progress Note   Patient: Anita Aguirre DOB: Nov 19, 1975 DOA: 10/20/2023     11 DOS: the patient was seen and examined on 10/31/2023   Brief hospital course: 48 y/o female with PMH for ADD (attention deficit disorder), Arthritis (04/24/2008), Diffuse cystic mastopathy (04/24/2010), Hypertension, Hypothyroidism,Mastitis and bleeding uterine fibroids who presented to [facility] with worsening SOB and chest pain for last 2 days worsening day of admission to hospital  Chest pain dull and not radiating.  She was found to have submassive Pulmonary embolism with right heart strain.  Her Troponin increased to 409, Lactic Acid down to 2.7 from 3.9. CT scan chest revealed, Bilateral pulmonary emboli with severe clot burden. 2. CT evidence of RIGHT ventricular strain. Positive for acute PE with CT evidence of right heart strain (RV/LV Ratio = 1.4) consistent with at least submassive (intermediate risk) PE. The presence of right heart strain has been associated with an increased risk of morbidity and mortality. Please refer to the Code PE Focused order set in EPIC. 3. Small pulmonary infarction in the RIGHT lower lobe. 4. Moderate hiatal hernia.   Patient says she has no previous h/o blood clots but does have a h/o spontaneous abortion.  She works from home and has a sit down job and her life style is very sedentary.  No family h/o Pulmonary emboli. At [facility] she was started on Heparin  drip and Milrinone  drip.  She is feeling better, currently not on pressors.  Case was d/w IR by Dr Isadora and she was not a good candidate for clot extraction but maybe a better case for catheter directed Thrombolysis despite have continually bleeding for several years, She says she has been having uterine bleeding since 2013 when she had a miscarriage.  Her uterine bleeding apparently from Fibroids has worsened over the last several months and she was being considered for Hysterectomy at [facility] but has not  made it there yet. No N/V/D, no Fever/chills, no recent flights or leg trauma.  She did c/o left calf pain over the last few days and increase size of left ankle.  6/28: transfer from [place].  Underwent VA ECMO and catheter directed thrombolysis 6/29 remain on VA ECMO, lysis catheter removed by IR.  Continue to have vaginal bleeding/clots 6/30 ECMO flow was decreased to 3 L, patient tolerated well, bedside echocardiogram showed mildly reduced RV function compared to prior.  Continued to have vaginal bleeding/clots 7/2 ECMO decannulated 7/4 milrinone  was stopped, off vasopressor support 7/5 tolerating spontaneous breathing trial but agitated and restless 7/7 stable on RA, off pressors   07/09 [facility]   Assessment and Plan: * Acute pulmonary embolism with acute cor pulmonale, unspecified pulmonary embolism type (HCC) Massive pulmonary embolism, requiring catheter directed thrombolysis.  Acute deep vein thrombosis left popliteal, left posterior tibial and peroneal veins.  SP VA ECMO and shock   Currently on direct oral anticoagulation  Vascular surgery has placed wound vac for one week (07/10).   Staph epi bacteremia.  Central lines have been removed.  Follow up cultures with no growth Will continue vancomycin  for now and will consult ID for further recommendations.   Abnormal uterine bleeding Continue with megace  and follow up with GYN as outpatient Currently with no active bleeding.   Thrombocytopenia (HCC) Continue close follow up cell count.   Hypertension Continue blood pressure monitoring.   Hypernatremia Hypokalemia.   Electrolytes have improved.  Patient required foley catheter placement for urinary retention.  Follow up with voiding trial  results.   Hypothyroidism Continue with levothyroxine    Obesity, class 2 Calculated BMI is 36.         Subjective: Patient is feeling well, no chest pain and no dyspnea, mobility has been improving.   Physical  Exam: Vitals:   10/31/23 0007 10/31/23 0458 10/31/23 0821 10/31/23 1220  BP: (!) 109/48 130/72 138/80 (!) 143/81  Pulse: 95 86 82 87  Resp: 19 18 19  (!) 24  Temp: 98.6 F (37 C) 98.9 F (37.2 C) 97.8 F (36.6 C) 99.1 F (37.3 C)  TempSrc:  Oral Oral Oral  SpO2:  98% 98% 100%  Weight:  104.4 kg    Height:       Neurology awake and alert ENT with mild pallor with no icterus Cardiovascular with S1 and S2 present and regular with no gallops, rubs or murmurs Respiratory with no rales or wheezing, no rhonchi  Abdomen with no distention  No lower extremity edema  Data Reviewed:    Family Communication: no family at the bedside   Disposition: Status is: Inpatient Remains inpatient appropriate because: recovering from pulmonary embolism, possible transfer to [facility]   Planned Discharge Destination: Home    Author: Elidia Toribio Furnace, MD 10/31/2023 5:06 PM  For on call review [email or web address].

## 2023-10-31 NOTE — Hospital Course (Signed)
 Mrs. Solorio was admitted to the hospital with the working diagnosis of massive pulmonary embolism.   48 y/o female with PMH for attention deficit disorder, Arthritis , Diffuse cystic mastopathy,  Hypertension, Hypothyroidism and bleeding uterine fibroids who presented to [facility] with worsening dyspnea and chest pain for last 2 days worsening day of admission to hospital. She was found to have submassive Pulmonary embolism with right heart strain.   Her Troponin increased to 409, Lactic Acid down to 2.7 from 3.9. CT scan chest revealed, Bilateral pulmonary emboli with severe clot burden with evidence of RIGHT ventricular strain.  Small pulmonary infarction in the RIGHT lower lobe.   At [facility] she was started on Heparin  drip and Milrinone  drip.   Case was d/w IR by Dr Isadora and she was not a good candidate for clot extraction but maybe a better case for catheter directed thrombolysis.  6/28: transfer from [place].  Underwent VA ECMO and catheter directed thrombolysis 6/29 remain on VA ECMO, lysis catheter removed by IR.  Continue to have vaginal bleeding/clots 6/30 ECMO flow was decreased to 3 L, patient tolerated well, bedside echocardiogram showed mildly reduced RV function compared to prior.  Continued to have vaginal bleeding/clots 7/2 ECMO decannulated 7/4 milrinone  was stopped, off vasopressor support 7/5 tolerating spontaneous breathing trial but agitated and restless. Positive fever, blood cultures were obtained and patient was started on antibiotic therapy with Vancomycin  and cefepime .  Blood cultures positive for staphylococcus epidermidis and hominis.  07/06 patient liberated from mechanical ventilation.  7/7 stable on RA, off pressors   07/09 transfer to [facility]  09/10 consulted ID for bacteremia, patient no longer needs CIR, and plan for discharge home with home health services if possible.  Pending sensitivities on staph hominis, if different from staph epi, will consider a  contamination and antibiotic therapy can be stopped. If same, will need further work up with TEE. Discussed with ID.

## 2023-10-31 NOTE — Assessment & Plan Note (Signed)
 Thrombocytopenia has resolved.  Stable cell count with hgb at 9.2  Reactive leukocytosis at 21.4   Follow up cell count in am.

## 2023-10-31 NOTE — Assessment & Plan Note (Signed)
 Continue with megace  and follow up with GYN as outpatient Currently with no active bleeding.

## 2023-10-31 NOTE — Assessment & Plan Note (Addendum)
 Massive pulmonary embolism, requiring catheter directed thrombolysis.  Acute deep vein thrombosis left popliteal, left posterior tibial and peroneal veins.  SP VA ECMO and shock   Currently on direct oral anticoagulation with apixaban , will need TOC pharmacy medications,  Vascular surgery has placed wound vac for one week and was removed today with good toleration.   Staph epi  and hominis bacteremia.  Central lines have been removed.  Follow up cultures with no growth Continue vancomycin  IV Case discussed with ID, plan to check sensitivities on staph hominis if different than staph epi, will consider a contaminant and can discontinue antibiotics, if the same will need further workup with TEE.

## 2023-11-01 ENCOUNTER — Other Ambulatory Visit (HOSPITAL_COMMUNITY): Payer: Self-pay

## 2023-11-01 DIAGNOSIS — D649 Anemia, unspecified: Secondary | ICD-10-CM

## 2023-11-01 DIAGNOSIS — Z9889 Other specified postprocedural states: Secondary | ICD-10-CM

## 2023-11-01 LAB — BASIC METABOLIC PANEL WITH GFR
Anion gap: 12 (ref 5–15)
BUN: 12 mg/dL (ref 6–20)
CO2: 18 mmol/L — ABNORMAL LOW (ref 22–32)
Calcium: 8.8 mg/dL — ABNORMAL LOW (ref 8.9–10.3)
Chloride: 109 mmol/L (ref 98–111)
Creatinine, Ser: 0.72 mg/dL (ref 0.44–1.00)
GFR, Estimated: >60 mL/min (ref >60)
Glucose, Bld: 108 mg/dL — ABNORMAL HIGH (ref 70–99)
Potassium: 3.6 mmol/L (ref 3.5–5.1)
Sodium: 139 mmol/L (ref 135–145)

## 2023-11-01 LAB — CBC
HCT: 29.3 % — ABNORMAL LOW (ref 36.0–46.0)
Hemoglobin: 9.2 g/dL — ABNORMAL LOW (ref 12.0–15.0)
MCH: 27.1 pg (ref 26.0–34.0)
MCHC: 31.4 g/dL (ref 30.0–36.0)
MCV: 86.2 fL (ref 80.0–100.0)
Platelets: 207 K/uL (ref 150–400)
RBC: 3.4 MIL/uL — ABNORMAL LOW (ref 3.87–5.11)
RDW: 19.9 % — ABNORMAL HIGH (ref 11.5–15.5)
WBC: 21.4 K/uL — ABNORMAL HIGH (ref 4.0–10.5)
nRBC: 0.2 % (ref 0.0–0.2)

## 2023-11-01 LAB — GLUCOSE, CAPILLARY
Glucose-Capillary: 99 mg/dL (ref 70–99)
Glucose-Capillary: 97 mg/dL (ref 70–99)
Glucose-Capillary: 90 mg/dL (ref 70–99)

## 2023-11-01 LAB — MAGNESIUM: Magnesium: 2 mg/dL (ref 1.7–2.4)

## 2023-11-01 MED ORDER — GLUCERNA SHAKE PO LIQD
237.0000 mL | Freq: Two times a day (BID) | ORAL | Status: DC
  Administered 2023-11-01 – 2023-11-04 (×5): 237 mL via ORAL

## 2023-11-01 NOTE — Progress Notes (Signed)
 Physical Therapy Treatment Patient Details Name: Anita Aguirre MRN: [id number] DOB: 1975/12/13 Today's Date: 11/01/2023   History of Present Illness 48 y/o female presented 6/28 with worsening SOB and chest pain. Found to have acute bilateral submassive PE with right heart strain, acute DVT involving left popliteal, left posterior tibial and peroneal veins.  Underwent VA ECMO and catheter directed thrombolysis. 7/2 ECMO decannulated. PMH for ADD (attention deficit disorder), Arthritis (04/24/2008), Diffuse cystic mastopathy (04/24/2010), Hypertension, Hypothyroidism,Mastitis and bleeding uterine fibroids.    PT Comments  Pt progressing towards goals. Currently pt is supervision level for out of bed activities requiring intermittent UE support for balance without an overt LOB. Educated on the importance of mobility in order to prevent build up of scar tissue at surgical site and conditioning. Pt was also educated on use of shower chair due to pt asking how she can shower while feeling week secondary to she is unable to take baths at this time. Pt stated understanding. Due to pt current functional status, home set up and available assistance at home recommending skilled physical therapy services 3x/week in order to address strength, balance and functional mobility to decrease risk for falls, injury and re-hospitalization.       If plan is discharge home, recommend the following: Assistance with cooking/housework;Assist for transportation;Help with stairs or ramp for entrance;A little help with walking and/or transfers     Equipment Recommendations  Other (comment) (shower chair)       Precautions / Restrictions Precautions Precautions: Fall Recall of Precautions/Restrictions: Intact Precaution/Restrictions Comments: Monitor O2 Restrictions Weight Bearing Restrictions Per Provider Order: No LLE Weight Bearing Per Provider Order: Non weight bearing     Mobility  Bed Mobility   General bed  mobility comments: Pt sitting EOB at beginning and end of session    Transfers Overall transfer level: Needs assistance Equipment used: None Transfers: Sit to/from Stand Sit to Stand: Supervision           General transfer comment: supervision, Pt uses walls/bed rails to steady herself upon standing due to mild instability    Ambulation/Gait Ambulation/Gait assistance: Supervision Gait Distance (Feet): 300 Feet Assistive device: None Gait Pattern/deviations: Step-through pattern, Decreased stride length, Drifts right/left Gait velocity: decreased Gait velocity interpretation: <1.31 ft/sec, indicative of household ambulator   General Gait Details: Pt intermittently utilizing rails, walls for balance but declines AD   Stairs Stairs: Yes Stairs assistance: Supervision Stair Management: One rail Left, Step to pattern Number of Stairs: 1 General stair comments: per home setup; pt educated on up with the good down with the bad. Pt stated understanding.      Balance Overall balance assessment: Needs assistance Sitting-balance support: No upper extremity supported, Feet supported Sitting balance-Leahy Scale: Normal Sitting balance - Comments: no UE support, sitting EOB upon arrival   Standing balance support: No upper extremity supported, During functional activity Standing balance-Leahy Scale: Fair Standing balance comment: No overt LOB, pt utilizes walls/rails and doors for balance but no significant LOB      Communication Communication Communication: No apparent difficulties  Cognition Arousal: Alert Behavior During Therapy: WFL for tasks assessed/performed     PT - Cognition Comments: Oriented x4. Following commands: Intact      Cueing Cueing Techniques: Verbal cues     General Comments General comments (skin integrity, edema, etc.): Pt fatigued and slightly short of breathe with activity. O2 sats and HR WNL. Educated on pacing activities.      Pertinent  Vitals/Pain Pain Assessment Pain Assessment:  No/denies pain           PT Goals (current goals can now be found in the care plan section) Acute Rehab PT Goals Patient Stated Goal: Get stronger go home PT Goal Formulation: With patient Time For Goal Achievement: 11/12/23 Potential to Achieve Goals: Good Progress towards PT goals: Progressing toward goals    Frequency    Min 2X/week      PT Plan  Continue with current POC        AM-PAC PT 6 Clicks Mobility   Outcome Measure  Help needed turning from your back to your side while in a flat bed without using bedrails?: None Help needed moving from lying on your back to sitting on the side of a flat bed without using bedrails?: None Help needed moving to and from a bed to a chair (including a wheelchair)?: A Little Help needed standing up from a chair using your arms (e.g., wheelchair or bedside chair)?: A Little Help needed to walk in hospital room?: A Little Help needed climbing 3-5 steps with a railing? : A Little 6 Click Score: 20    End of Session Equipment Utilized During Treatment: Gait belt Activity Tolerance: Patient tolerated treatment well Patient left: in bed;with call bell/phone within reach Nurse Communication: Mobility status PT Visit Diagnosis: Unsteadiness on feet (R26.81);Other abnormalities of gait and mobility (R26.89);Muscle weakness (generalized) (M62.81)     Time: 8958-8947 PT Time Calculation (min) (ACUTE ONLY): 11 min  Charges:    $Therapeutic Activity: 8-22 mins PT General Charges $$ ACUTE PT VISIT: 1 Visit                     Dorothyann Maier, DPT, CLT  Acute Rehabilitation Services Office: [phone] (Secure chat preferred)    Dorothyann VEAR Maier 11/01/2023, 11:01 AM

## 2023-11-01 NOTE — Progress Notes (Signed)
 Progress Note   Patient: Anita Aguirre FMW:[id number] DOB: [birth year] DOA: 10/20/2023     12 DOS: the patient was seen and examined on 11/01/2023   Brief hospital course: Mrs. Branden was admitted to the hospital with the working diagnosis of massive pulmonary embolism.   48 y/o female with PMH for attention deficit disorder, Arthritis , Diffuse cystic mastopathy,  Hypertension, Hypothyroidism and bleeding uterine fibroids who presented to [facility] with worsening dyspnea and chest pain for last 2 days worsening day of admission to hospital. She was found to have submassive Pulmonary embolism with right heart strain.   Her Troponin increased to 409, Lactic Acid down to 2.7 from 3.9. CT scan chest revealed, Bilateral pulmonary emboli with severe clot burden with evidence of RIGHT ventricular strain.  Small pulmonary infarction in the RIGHT lower lobe.   At [facility] she was started on Heparin  drip and Milrinone  drip.   Case was d/w IR by Dr Isadora and she was not a good candidate for clot extraction but maybe a better case for catheter directed thrombolysis.  6/28: transfer from [place].  Underwent VA ECMO and catheter directed thrombolysis 6/29 remain on VA ECMO, lysis catheter removed by IR.  Continue to have vaginal bleeding/clots 6/30 ECMO flow was decreased to 3 L, patient tolerated well, bedside echocardiogram showed mildly reduced RV function compared to prior.  Continued to have vaginal bleeding/clots 7/2 ECMO decannulated 7/4 milrinone  was stopped, off vasopressor support 7/5 tolerating spontaneous breathing trial but agitated and restless. Positive fever, blood cultures were obtained and patient was started on antibiotic therapy with Vancomycin  and cefepime .  Blood cultures positive for staphylococcus epidermidis and hominis.  07/06 patient liberated from mechanical ventilation.  7/7 stable on RA, off pressors   07/09 transfer to [facility]  09/10 consulted ID for bacteremia,  patient no longer needs CIR, and plan for discharge home with home health services if possible.  Pending sensitivities on staph hominis, if different from staph epi, will consider a contamination and antibiotic therapy can be stopped. If same, will need further work up with TEE. Discussed with ID.   Assessment and Plan: * Acute pulmonary embolism with acute cor pulmonale, unspecified pulmonary embolism type (HCC) Massive pulmonary embolism, requiring catheter directed thrombolysis.  Acute deep vein thrombosis left popliteal, left posterior tibial and peroneal veins.  SP VA ECMO and shock   Currently on direct oral anticoagulation with apixaban , will need TOC pharmacy medications,  Vascular surgery has placed wound vac for one week and was removed today with good toleration.   Staph epi  and hominis bacteremia.  Central lines have been removed.  Follow up cultures with no growth Continue vancomycin  IV Case discussed with ID, plan to check sensitivities on staph hominis if different than staph epi, will consider a contaminant and can discontinue antibiotics, if the same will need further workup with TEE.   Abnormal uterine bleeding Continue with megace  and follow up with GYN as outpatient Currently with no active bleeding.   Chronic anemia Thrombocytopenia has resolved.  Stable cell count with hgb at 9.2  Reactive leukocytosis at 21.4   Follow up cell count in am.   Hypertension Continue blood pressure monitoring.   Hypernatremia Hypokalemia.   Renal function stable with serum cr at 0,72, K is 3,6 and serum bicarbonate at 18  Na 139 Mg 2.0   Foley removed.   Hypothyroidism Continue with levothyroxine    Obesity, class 2 Calculated BMI is 36.  Subjective: Patient is feeling better, no chest pain and no dyspnea, no groin pain, wound vac has been removed.   Physical Exam: Vitals:   11/01/23 0354 11/01/23 0731 11/01/23 1214 11/01/23 1623  BP: 113/77 118/71 113/87  (!) 113/53  Pulse: 82 82 84 84  Resp: 17 (!) 24 (!) 24 20  Temp: 98.3 F (36.8 C) 98.4 F (36.9 C) 98 F (36.7 C) 98.4 F (36.9 C)  TempSrc: Oral Oral Oral Oral  SpO2:  99% 100% 100%  Weight: 103.1 kg     Height:       Neurology awake and alert ENT with mild pallor  Cardiovascular with S1 and S2 present and regular with no gallops, rubs or murmurs Respiratory with no rales or wheezing, no rhonchi  Abdomen with no distention  Mild right lower extremity edema, non pitting  Data Reviewed:    Family Communication: no family at the bedside   Disposition: Status is: Inpatient Remains inpatient appropriate because: pending sensitivities on cultures   Planned Discharge Destination: Home     Author: Elidia Toribio Furnace, MD 11/01/2023 4:38 PM  For on call review [email or web address].

## 2023-11-01 NOTE — Progress Notes (Signed)
 Patient seen and examined this morning.  Doing much better than when seen last as she was still intubated status post ECMO decannulation.  Wound VAC in the left groin removed.  No wound healing concerns at this time.  Excellent pulse in the left foot.  Will schedule 3-week follow-up in our office for staple removal.

## 2023-11-01 NOTE — Progress Notes (Signed)
 Nutrition Follow-up  DOCUMENTATION CODES:   Not applicable  INTERVENTION:  -Continue diet as ordered -Change EPHP to Glucerna per pt complaint of high BG after consumption -Continue MC BID -Continue MVI   NUTRITION DIAGNOSIS:   Inadequate oral intake related to inability to eat as evidenced by NPO status.  Resolved  Inadequate oral intake r/t decreased appetite AEB meal completion <50%   GOAL:   Patient will meet greater than or equal to 90% of their needs  Progressing  MONITOR:   PO intake, Supplement acceptance, Labs, Weight trends, Skin  REASON FOR ASSESSMENT:   Consult Enteral/tube feeding initiation and management  ASSESSMENT:   Pt with PMH significant for: ADD, arthritis, HTN, hypothyroidism, cystic mastopathy, mastitis and bleeding of uterine fibroids. Presented to [facility] with c/o worsening SOB and chest pain x2 days. Found to have pulmonary embolism w/ R heart strain. Not a good candidate for clot extraction. Transferred to [facility] and underwent VA ECMO and catheter directed thrombolysis.  Spoke to pt at bedside. Overall much improved, denies n/v/c/d or chewing/swallowing difficulties. Last BM 7/9. Pt does endorse decreased appetite. Documented meal intake typically 50-75%. Discussed importance of adequate kcal/pro intake for healing/maintaining muscle mass even if weight loss is a goal long term both while hospitalized and after d/c. Pt verbalized understanding.  Pt states she was drinking EPHP, however, BG was elevated so she no longer is drinking them. Will trial Glucerna BID to promote better BG control. Pt denies additional questions/concerns at this time, will continue to monitor, RDN available prn.    Labs BG 108 Ca 8.8 ALK 14 Albumin  2.3 H/H 9.2/29.3  Medications Eliquis  SSI Levothyroxine  Megace  MVI w/ min Vancomycin     Diet Order:   Diet Order             Diet Heart Room service appropriate? Yes with Assist; Fluid consistency: Thin  Diet  effective now                   EDUCATION NEEDS:   Education needs have been addressed  Skin:  Skin Assessment: Skin Integrity Issues: Skin Integrity Issues:: Incisions Incisions: Groin incision closed  Last BM:  7/9  Height:   Ht Readings from Last 1 Encounters:  10/25/23 5' 7 (1.702 m)    Weight:   Wt Readings from Last 1 Encounters:  11/01/23 103.1 kg    Ideal Body Weight:  61.4 kg  BMI:  Body mass index is 35.58 kg/m.  Estimated Nutritional Needs:   Kcal:  1850-2050 kcals  Protein:  95-110g  Fluid:  >1.9L/day   Philip Kotlyar Daml-Budig, RDN, LDN Registered Dietitian Nutritionist RD Inpatient Contact Info in [place]

## 2023-11-02 ENCOUNTER — Other Ambulatory Visit (HOSPITAL_COMMUNITY): Payer: Self-pay

## 2023-11-02 ENCOUNTER — Encounter: Payer: Self-pay | Admitting: Student in an Organized Health Care Education/Training Program

## 2023-11-02 LAB — CBC
HCT: 30.7 % — ABNORMAL LOW (ref 36.0–46.0)
Hemoglobin: 9.6 g/dL — ABNORMAL LOW (ref 12.0–15.0)
MCH: 26.9 pg (ref 26.0–34.0)
MCHC: 31.3 g/dL (ref 30.0–36.0)
MCV: 86 fL (ref 80.0–100.0)
Platelets: 338 K/uL (ref 150–400)
RBC: 3.57 MIL/uL — ABNORMAL LOW (ref 3.87–5.11)
RDW: 20 % — ABNORMAL HIGH (ref 11.5–15.5)
WBC: 16 K/uL — ABNORMAL HIGH (ref 4.0–10.5)
nRBC: 0.1 % (ref 0.0–0.2)

## 2023-11-02 LAB — VANCOMYCIN, PEAK: Vancomycin Pk: 27 ug/mL — ABNORMAL LOW (ref 30–40)

## 2023-11-02 LAB — BASIC METABOLIC PANEL WITH GFR
Anion gap: 9 (ref 5–15)
BUN: 12 mg/dL (ref 6–20)
CO2: 22 mmol/L (ref 22–32)
Calcium: 9.1 mg/dL (ref 8.9–10.3)
Chloride: 109 mmol/L (ref 98–111)
Creatinine, Ser: 0.83 mg/dL (ref 0.44–1.00)
GFR, Estimated: >60 mL/min (ref >60)
Glucose, Bld: 103 mg/dL — ABNORMAL HIGH (ref 70–99)
Potassium: 3.8 mmol/L (ref 3.5–5.1)
Sodium: 140 mmol/L (ref 135–145)

## 2023-11-02 LAB — MAGNESIUM: Magnesium: 2.2 mg/dL (ref 1.7–2.4)

## 2023-11-02 LAB — VANCOMYCIN, TROUGH: Vancomycin Tr: 10 ug/mL — ABNORMAL LOW (ref 15–20)

## 2023-11-02 NOTE — Telephone Encounter (Signed)
 Contacted PT. Still I hospital. No release date. Will send letter to call us  when discharged. NFN

## 2023-11-02 NOTE — TOC Transition Note (Addendum)
 Transition of Care [facility]) - Discharge Note   Patient Details  Name: Anita Aguirre MRN: [id number] Date of Birth: 01-27-1976  Transition of [facility]) CM/SW Contact:  Justina Delcia Czar, RN Phone Number: [phone] 11/02/2023, 2:34 PM   Clinical Narrative:     TOC CM spoke to pt. Provided pt with Living Better with HF booklet. Discussed daily weight and low sodium/heart healthy diet. Pt states she will have transportation to appts. She drives but not long distance.   Pt declines for RW for home. Hospital follow up appt scheduled for 11/12/2023 at 3 pm. Estefana Candy MD, fax number [phone].  [facility] [place] phone: [phone] fax [phone] Fax discharge summary at dc.  [facility] for medications, discussed $3 copay at dc.   Final next level of care: Home/Self Care Barriers to Discharge: No Barriers Identified   Patient Goals and CMS Choice Patient states their goals for this hospitalization and ongoing recovery are:: wants to continue working          Discharge Placement                       Discharge Plan and Services Additional resources added to the After Visit Summary for   In-house Referral: Financial Counselor Discharge Planning Services: CM Consult Post Acute Care Choice: IP Rehab            DME Agency: NA                  Social Drivers of Health (SDOH) Interventions SDOH Screenings   Food Insecurity: Food Insecurity Present (10/20/2023)  Housing: High Risk (10/20/2023)  Transportation Needs: Unmet Transportation Needs (10/20/2023)  Utilities: Not At Risk (10/20/2023)  Social Connections: Unknown (10/20/2023)  Tobacco Use: Medium Risk (10/24/2023)     Readmission Risk Interventions     No data to display

## 2023-11-02 NOTE — Progress Notes (Signed)
 Physical Therapy Treatment Patient Details Name: Anita Aguirre MRN: [id number] DOB: 1975-07-11 Today's Date: 11/02/2023   History of Present Illness 48 y/o female presented 6/28 with worsening SOB and chest pain. Found to have acute bilateral submassive PE with right heart strain, acute DVT involving left popliteal, left posterior tibial and peroneal veins.  Underwent [facility] ECMO and catheter directed thrombolysis. 7/2 ECMO decannulated. PMH for ADD (attention deficit disorder), Arthritis (04/24/2008), Diffuse cystic mastopathy (04/24/2010), Hypertension, Hypothyroidism,Mastitis and bleeding uterine fibroids.    PT Comments  Pt progressing well toward goals and shows ability to manage safely at home with Mom's PRN support.  Emphasis on safety, dynamic gait with S to mod I, education on managing with PE at home and safe negotiation of stairs, All Supervision or better.     If plan is discharge home, recommend the following: Assistance with cooking/housework;Assist for transportation   Can travel by private vehicle        Equipment Recommendations  None recommended by PT    Recommendations for Other Services       Precautions / Restrictions Precautions Precautions: Fall Recall of Precautions/Restrictions: Intact Precaution/Restrictions Comments: Monitor O2 Restrictions Weight Bearing Restrictions Per Provider Order: No     Mobility  Bed Mobility Overal bed mobility: Modified Independent                  Transfers Overall transfer level: Needs assistance Equipment used: None Transfers: Sit to/from Stand, Bed to chair/wheelchair/BSC Sit to Stand: Supervision   Step pivot transfers: Supervision       General transfer comment: S for safety    Ambulation/Gait Ambulation/Gait assistance: Supervision Gait Distance (Feet): 500 Feet (with 2 standing rests.) Assistive device: None Gait Pattern/deviations: Step-through pattern   Gait velocity interpretation: 1.31 - 2.62  ft/sec, indicative of limited community ambulator   General Gait Details: generally steady with noticeable dyspnea and fatigue, but pt states tolerable and much improved.   Stairs Stairs: Yes Stairs assistance: Supervision Stair Management: One rail Left, Step to pattern, Forwards Number of Stairs: 3 General stair comments: safe with a stationary support   Wheelchair Mobility     Tilt Bed    Modified Rankin (Stroke Patients Only)       Balance Overall balance assessment: Needs assistance Sitting-balance support: No upper extremity supported, Feet supported Sitting balance-Leahy Scale: Normal     Standing balance support: No upper extremity supported, During functional activity Standing balance-Leahy Scale: Fair                   Standardized Balance Assessment Standardized Balance Assessment : Dynamic Gait Index   Dynamic Gait Index Level Surface: Normal Change in Gait Speed: Mild Impairment Gait with Horizontal Head Turns: Normal Gait with Vertical Head Turns: Normal Gait and Pivot Turn: Normal Step Over Obstacle: Normal Step Around Obstacles: Mild Impairment Steps: Mild Impairment Total Score: 21      Communication Communication Communication: No apparent difficulties  Cognition Arousal: Alert Behavior During Therapy: WFL for tasks assessed/performed                             Following commands: Intact      Cueing Cueing Techniques: Verbal cues  Exercises      General Comments General comments (skin integrity, edema, etc.): Access Code: [email or web address]  URL: [email or web address]  Date: 11/02/2023  Prepared by: Adrianne Savers    Patient Education  - Understanding Energy Conservation  - Energy  Conservation During Daily Tasks      Pertinent Vitals/Pain Pain Assessment Pain Assessment: No/denies pain Pain Intervention(s): Monitored during session    Home Living                          Prior Function             PT Goals (current goals can now be found in the care plan section) Acute Rehab PT Goals PT Goal Formulation: With patient Time For Goal Achievement: 11/12/23 Potential to Achieve Goals: Good Progress towards PT goals: Progressing toward goals    Frequency    Min 2X/week      PT Plan      Co-evaluation              AM-PAC PT 6 Clicks Mobility   Outcome Measure  Help needed turning from your back to your side while in a flat bed without using bedrails?: None Help needed moving from lying on your back to sitting on the side of a flat bed without using bedrails?: A Little Help needed moving to and from a bed to a chair (including a wheelchair)?: A Little Help needed standing up from a chair using your arms (e.g., wheelchair or bedside chair)?: A Little Help needed to walk in hospital room?: A Little Help needed climbing 3-5 steps with a railing? : A Little 6 Click Score: 19    End of Session   Activity Tolerance: Patient tolerated treatment well Patient left: in bed;with call bell/phone within reach Nurse Communication: Mobility status PT Visit Diagnosis: Unsteadiness on feet (R26.81);Difficulty in walking, not elsewhere classified (R26.2)     Time: 8766-8740 PT Time Calculation (min) (ACUTE ONLY): 26 min  Charges:    $Gait Training: 8-22 mins $Therapeutic Activity: 8-22 mins PT General Charges $$ ACUTE PT VISIT: 1 Visit                     11/02/2023  India HERO., PT Acute Rehabilitation Services [phone]  (office)   Anita Aguirre 11/02/2023, 2:22 PM

## 2023-11-02 NOTE — Progress Notes (Signed)
 Progress Note   Patient: Anita Aguirre FMW:[id number] DOB: 06-01-1975 DOA: 10/20/2023     13 DOS: the patient was seen and examined on 11/02/2023   Brief hospital course: Patient is a 48 year old female past medical history significant for hypertension, diffuse cystic mastopathy, hypothyroidism, bleeding uterine fibroids, ADD and arthritis.  Patient was admitted with massive pulmonary embolism.  Patient underwent VA ECMO and catheter directed thrombolysis.  Patient was initially in the ICU team.  [facility] assumed care on 10/31/2023.  Patient has grown Staph epidermidis, significance is unclear.  Infectious disease is directing.  Patient is on vancomycin .  11/02/2023: Patient seen.  No new complaints.  Significant hospital events: At [facility] she was started on Heparin  drip and Milrinone  drip.   Case was d/w IR by Dr Isadora and she was not a good candidate for clot extraction but maybe a better case for catheter directed thrombolysis.  6/28: transfer from [place].  Underwent VA ECMO and catheter directed thrombolysis 6/29 remain on VA ECMO, lysis catheter removed by IR.  Continue to have vaginal bleeding/clots 6/30 ECMO flow was decreased to 3 L, patient tolerated well, bedside echocardiogram showed mildly reduced RV function compared to prior.  Continued to have vaginal bleeding/clots 7/2 ECMO decannulated 7/4 milrinone  was stopped, off vasopressor support 7/5 tolerating spontaneous breathing trial but agitated and restless. Positive fever, blood cultures were obtained and patient was started on antibiotic therapy with Vancomycin  and cefepime .  Blood cultures positive for staphylococcus epidermidis and hominis.  07/06 patient liberated from mechanical ventilation.  7/7 stable on RA, off pressors   07/09 transfer to [facility]  09/10 consulted ID for bacteremia, patient no longer needs CIR, and plan for discharge home with home health services if possible.  Pending sensitivities on staph hominis, if  different from staph epi, will consider a contamination and antibiotic therapy can be stopped. If same, will need further work up with TEE. Discussed with ID.   Assessment and Plan: Acute pulmonary embolism with acute cor pulmonale, unspecified pulmonary embolism type (HCC) Massive pulmonary embolism, requiring catheter directed thrombolysis.  Acute deep vein thrombosis left popliteal, left posterior tibial and peroneal veins.  SP VA ECMO and shock  Patient has been transitioned over to apixaban . No shortness of breath.    Vascular surgery has placed wound vac for one week and was removed today with good toleration.   Staph epi  and hominis bacteremia.  Central lines have been removed.  Follow up cultures with no growth Continue vancomycin  IV Case discussed with ID, plan to check sensitivities on staph hominis if different than staph epi, will consider a contaminant and can discontinue antibiotics, if the same will need further workup with TEE.   Hypernatremia: - Resolved. - Sodium of 140 today.  Hypokalemia: - Resolved. Potassium of 3.8 today.  Chronic anemia: - Normocytic. - Continue to follow.  Thrombocytopenia has resolved.   Reactive leukocytosis: - Improving. - WBC of 16.   Abnormal uterine bleeding Continue with megace  and follow up with GYN as outpatient Currently with no active bleeding.   Obesity, class 2 Calculated BMI is 36.   Hypothyroidism Continue with levothyroxine    Hypertension Continue blood pressure monitoring.       Subjective: - No new complaints. No shortness of breath No chest pain.    Physical Exam: Vitals:   11/02/23 0500 11/02/23 1249 11/02/23 1624 11/02/23 1952  BP:  93/79 118/76 122/84  Pulse:  100 95 94  Resp:  15 16 18   Temp:  98.1 F (36.7 C) 98.4 F (36.9 C) 98.4 F (36.9 C)  TempSrc:  Oral Oral Oral  SpO2:  95% 99% 100%  Weight: 101.8 kg     Height:       General Condition: Patient is obese.  Not in any  distress.  Awake and alert.  Right lower extremity is greater than left lower extremity. HEENT: Patient is pale. Neck: Supple. Lungs: Clear to auscultation. CVs: S1-S2. Abdomen: Soft and nontender. Neuro: Awake and alert. Extremities: Right lower extremity is greater in size than left lower extremity.    Data Reviewed:    Family Communication:   Disposition: Status is: Inpatient Remains inpatient appropriate because: pending sensitivities on cultures   Planned Discharge Destination: Home     Author: Leatrice LILLETTE Chapel, MD 11/02/2023 8:54 PM  For on call review [email or web address].

## 2023-11-02 NOTE — Progress Notes (Signed)
 Occupational Therapy Treatment Patient Details Name: Anita Aguirre MRN: [id number] DOB: February 24, 1976 Today's Date: 11/02/2023   History of present illness 48 y/o female presented 6/28 with worsening SOB and chest pain. Found to have acute bilateral submassive PE with right heart strain, acute DVT involving left popliteal, left posterior tibial and peroneal veins.  Underwent VA ECMO and catheter directed thrombolysis. 7/2 ECMO decannulated. PMH for ADD (attention deficit disorder), Arthritis (04/24/2008), Diffuse cystic mastopathy (04/24/2010), Hypertension, Hypothyroidism,Mastitis and bleeding uterine fibroids.   OT comments  OT session focused on education in preparation for discharge. Provided and reviewed energy conservation handout with pt. Also reviewed DME and AE recommendations, as well as additional purchasing methods. Pt verbalizing understanding of all education provided. Progressed ADLs to baseline/mod I with additional time. Pt reporting no OT related concerns. Goals met, all education complete, no further acute OT needs. OT is signing off on this pt.       If plan is discharge home, recommend the following:  Assistance with cooking/housework;Assist for transportation   Equipment Recommendations  BSC/3in1    Recommendations for Other Services      Precautions / Restrictions Precautions Precautions: Fall Recall of Precautions/Restrictions: Intact Precaution/Restrictions Comments: Monitor O2 Restrictions Weight Bearing Restrictions Per Provider Order: No       Mobility Bed Mobility Overal bed mobility: Modified Independent        Transfers Overall transfer level: Needs assistance Equipment used: None Transfers: Sit to/from Stand, Bed to chair/wheelchair/BSC Sit to Stand: Supervision     Step pivot transfers: Supervision     General transfer comment: S for safety     Balance Overall balance assessment: Needs assistance Sitting-balance support: No upper  extremity supported, Feet supported Sitting balance-Leahy Scale: Normal     Standing balance support: No upper extremity supported, During functional activity Standing balance-Leahy Scale: Fair       ADL either performed or assessed with clinical judgement   ADL Overall ADL's : Modified independent;At baseline     General ADL Comments: Reviewed energy conservation handout    Extremity/Trunk Assessment Upper Extremity Assessment Upper Extremity Assessment: Overall WFL for tasks assessed   Lower Extremity Assessment Lower Extremity Assessment: Defer to PT evaluation        Vision   Vision Assessment?: No apparent visual deficits         Communication Communication Communication: No apparent difficulties   Cognition Arousal: Alert Behavior During Therapy: WFL for tasks assessed/performed Cognition: No apparent impairments     Following commands: Intact        Cueing   Cueing Techniques: Verbal cues        General Comments Access Code: [email or web address]  URL: [email or web address]  Date: 11/02/2023  Prepared by: Adrianne Savers    Patient Education  - Understanding Energy Conservation  - Energy Conservation During Daily Tasks    Pertinent Vitals/ Pain       Pain Assessment Pain Assessment: No/denies pain Pain Intervention(s): Monitored during session   Frequency  Min 2X/week        Progress Toward Goals  OT Goals(current goals can now be found in the care plan section)  Progress towards OT goals: Goals met/education completed, patient discharged from OT  Acute Rehab OT Goals Patient Stated Goal: To go home OT Goal Formulation: With patient Time For Goal Achievement: 11/14/23 Potential to Achieve Goals: Good ADL Goals Pt Will Perform Grooming: standing;Independently Pt Will Perform Tub/Shower Transfer: Independently;Tub transfer;Shower transfer;shower seat;ambulating Additional ADL Goal #1: Pt will verbalize at least  3 energy conservation  strategies for ADLs/IADLs to promote safety within the home  Plan         AM-PAC OT 6 Clicks Daily Activity     Outcome Measure   Help from another person eating meals?: None Help from another person taking care of personal grooming?: None Help from another person toileting, which includes using toliet, bedpan, or urinal?: None Help from another person bathing (including washing, rinsing, drying)?: None Help from another person to put on and taking off regular upper body clothing?: None Help from another person to put on and taking off regular lower body clothing?: None 6 Click Score: 24    End of Session    OT Visit Diagnosis: Unsteadiness on feet (R26.81);Other abnormalities of gait and mobility (R26.89)   Activity Tolerance Patient tolerated treatment well   Patient Left in bed;with call bell/phone within reach   Nurse Communication Mobility status        Time: 1331-1350 OT Time Calculation (min): 19 min  Charges: OT General Charges $OT Visit: 1 Visit OT Treatments $Self Care/Home Management : 8-22 mins  Adrianne BROCKS, OT  Acute Rehabilitation Services Office [phone] Secure chat preferred   Adrianne GORMAN Savers 11/02/2023, 2:02 PM

## 2023-11-02 NOTE — TOC CM/SW Note (Signed)
 MATCH MEDICATION ASSISTANCE CARD Pharmacies please call [phone] for claim processing assistance.  Rx BIN: [id number] Rx Group: [id number] Rx PCN: [id number] Relationship Code: 1 Person Code: 01  Patient ID (MRN): MOSES [id number]    Patient Name: Anita Aguirre    Patient DOB:[birth year]   Discharge Date:11/02/2023  Expiration Date: (must be filled within 7 days of discharge)

## 2023-11-02 NOTE — Progress Notes (Signed)
 Pharmacy Antibiotic Note  Anita Aguirre is a 48 y.o. female admitted on 10/20/2023 with PE s/p ECMO cannulation. Pt spiking fevers overnight with leukocytosis.   7/6 Received BCID Call : pt growing 2/4 bottles (different sets) GPCs in clusters identified as staph epi with mecA on BCID. Discussed with Damien Fava, MD and decision made to start vancomycin .  Patient received a dose of vancomycin  750 mg IV 7/5 2352. Patient appears to be hemodynamically stable for the time being. Will omit load.   7/8 Vanc peak came back >60. D/w RN and appears to have been drawn appropriately.   7/11 vancomycin  peak 27  trough 10 AUC 524 at goal > continue vancomycin  1gm IV q12hr  Plan: Vancomycin  1gm IV q12h  Height: 5' 7 (170.2 cm) Weight: 101.8 kg (224 lb 6.4 oz) IBW/kg (Calculated) : 61.6  Temp (24hrs), Avg:98.5 F (36.9 C), Min:98.1 F (36.7 C), Max:98.8 F (37.1 C)  Recent Labs  Lab 10/29/23 0430 10/29/23 1708 10/29/23 2017 10/30/23 0456 10/30/23 1343 10/30/23 2122 10/31/23 0902 11/01/23 0400 11/02/23 0134 11/02/23 0419 11/02/23 1021  WBC 21.6*  --   --  20.8*  --   --  22.7* 21.4*  --  16.0*  --   CREATININE 0.70 0.66  --  0.63  --   --  0.75 0.72  --  0.83  --   LATICACIDVEN  --   --  1.0  --   --   --   --   --   --   --   --   VANCOTROUGH  --   --   --   --   --  8*  --   --   --   --  10*  VANCOPEAK  --   --   --   --  >60*  --   --   --  27*  --   --     Estimated Creatinine Clearance: 101.7 mL/min (by C-G formula based on SCr of 0.83 mg/dL).    Allergies  Allergen Reactions   Wellbutrin [Bupropion] Hives, Itching and Other (See Comments)    Skin crawling sensation   Heparin      HIT   Antibiotics this admission: Merrem  6/28 > 7/2 Vancomycin  6/28 > 7/2, 7/6 >> Cefepime  7/5 >> 7/7   Microbiology: 6/28 MRSA neg 7/1 BAL: neg 7/5 Bcx: 3/4 GPC in clusters (BCID staph epi)   Olam Chalk Pharm.D. CPP, BCPS Clinical Pharmacist [phone] 11/02/2023 2:24 PM

## 2023-11-02 NOTE — Telephone Encounter (Signed)
 Made appt. Mailed reminder letter per [place].

## 2023-11-03 LAB — CULTURE, BLOOD (ROUTINE X 2)
Culture: NO GROWTH
Culture: NO GROWTH
Special Requests: ADEQUATE
Special Requests: ADEQUATE

## 2023-11-03 LAB — MAGNESIUM: Magnesium: 2.2 mg/dL (ref 1.7–2.4)

## 2023-11-03 MED ORDER — ALPRAZOLAM 0.25 MG PO TABS
0.2500 mg | ORAL_TABLET | Freq: Two times a day (BID) | ORAL | Status: DC | PRN
  Administered 2023-11-03 (×3): 0.25 mg via ORAL
  Filled 2023-11-03 (×3): qty 1

## 2023-11-03 MED ORDER — ACETAMINOPHEN 160 MG/5ML PO SOLN
650.0000 mg | Freq: Four times a day (QID) | ORAL | Status: DC | PRN
  Administered 2023-11-03 (×2): 650 mg via ORAL
  Filled 2023-11-03 (×2): qty 20.3

## 2023-11-03 NOTE — Progress Notes (Signed)
 Physical Therapy Discharge Note Patient Details Name: Anita Aguirre MRN: [id number] DOB: [birth year] Today's Date: 11/03/2023   History of Present Illness 48 y/o female presented 6/28 with worsening SOB and chest pain. Found to have acute bilateral submassive PE with right heart strain, acute DVT involving left popliteal, left posterior tibial and peroneal veins.  Underwent VA ECMO and catheter directed thrombolysis. 7/2 ECMO decannulated. PMH for ADD (attention deficit disorder), Arthritis (04/24/2008), Diffuse cystic mastopathy (04/24/2010), Hypertension, Hypothyroidism,Mastitis and bleeding uterine fibroids.    PT Comments  Pt is currently Mod I to Ind for all functional mobility including step per home setup. Pt has a post on the L that she can hold onto. Pt continues to get fatigued with distance due to poor endurance; all needs can be met outside acute setting. Currently pt is presenting close to baseline level of functioning and no skilled physical therapy services recommended. Pt will be discharged from skilled physical therapy services at this time; please re-consult if further needs arise.       If plan is discharge home, recommend the following: Assistance with cooking/housework;Assist for transportation     Equipment Recommendations  None recommended by PT       Precautions / Restrictions Precautions Precautions: Fall Recall of Precautions/Restrictions: Intact Precaution/Restrictions Comments: Monitor O2 Restrictions Weight Bearing Restrictions Per Provider Order: No LLE Weight Bearing Per Provider Order: Weight bearing as tolerated     Mobility  Bed Mobility Overal bed mobility: Independent Bed Mobility: Supine to Sit     Supine to sit: Independent          Transfers Overall transfer level: Independent Equipment used: None Transfers: Sit to/from Stand Sit to Stand: Independent                Ambulation/Gait Ambulation/Gait assistance: Modified  independent (Device/Increase time) Gait Distance (Feet): 400 Feet Assistive device: None Gait Pattern/deviations: Step-through pattern Gait velocity: decreased Gait velocity interpretation: 1.31 - 2.62 ft/sec, indicative of limited community ambulator   General Gait Details: generally steady with noticeable dyspnea and fatigue, but pt states tolerable and much improved.   Stairs Stairs: Yes Stairs assistance: Modified independent (Device/Increase time) Stair Management: One rail Left, Step to pattern, Forwards Number of Stairs: 1       Balance Overall balance assessment: Modified Independent, Mild deficits observed, not formally tested Sitting-balance support: No upper extremity supported, Feet supported Sitting balance-Leahy Scale: Normal     Standing balance support: No upper extremity supported, During functional activity Standing balance-Leahy Scale: Fair Standing balance comment: No overt LOB, pt utilizes walls/rails and doors for balance but no significant LOB      Communication Communication Communication: No apparent difficulties  Cognition Arousal: Alert Behavior During Therapy: WFL for tasks assessed/performed   PT - Cognitive impairments: No apparent impairments     Following commands: Intact      Cueing Cueing Techniques: Verbal cues         Pertinent Vitals/Pain Pain Assessment Pain Assessment: No/denies pain     PT Goals (current goals can now be found in the care plan section) Progress towards PT goals: Goals met/education completed, patient discharged from PT       PT Plan  Discharge PT services at this time.        AM-PAC PT 6 Clicks Mobility   Outcome Measure  Help needed turning from your back to your side while in a flat bed without using bedrails?: None Help needed moving from lying on your back to  sitting on the side of a flat bed without using bedrails?: None Help needed moving to and from a bed to a chair (including a  wheelchair)?: None Help needed standing up from a chair using your arms (e.g., wheelchair or bedside chair)?: None Help needed to walk in hospital room?: None Help needed climbing 3-5 steps with a railing? : A Little 6 Click Score: 23    End of Session Equipment Utilized During Treatment: Gait belt Activity Tolerance: Patient tolerated treatment well Patient left: in bed;with call bell/phone within reach Nurse Communication: Mobility status       Time: 1148-1200 PT Time Calculation (min) (ACUTE ONLY): 12 min  Charges:    $Therapeutic Activity: 8-22 mins PT General Charges $$ ACUTE PT VISIT: 1 Visit                    Anita Aguirre, DPT, CLT  Acute Rehabilitation Services Office: [phone] (Secure chat preferred)    Anita Aguirre 11/03/2023, 12:05 PM

## 2023-11-03 NOTE — Progress Notes (Signed)
 Progress Note   Patient: Anita Aguirre FMW:[id number] DOB: 05/25/1975 DOA: 10/20/2023     14 DOS: the patient was seen and examined on 11/03/2023   Brief hospital course: Patient is a 48 year old female past medical history significant for hypertension, diffuse cystic mastopathy, hypothyroidism, bleeding uterine fibroids, ADD and arthritis.  Patient was admitted with massive pulmonary embolism.  Patient underwent VA ECMO and catheter directed thrombolysis.  Patient was initially in the ICU team.  [facility] assumed care on 10/31/2023.  Patient has grown Staph epidermidis, significance is unclear.  Infectious disease is directing.  Patient is on vancomycin .  11/03/2023: Patient seen.  No new complaints.  Awaiting sensitivity on no positive cultures.  I personally discussed with microbiology team today.  Significant hospital events: At [facility] she was started on Heparin  drip and Milrinone  drip.   Case was d/w IR by Dr Isadora and she was not a good candidate for clot extraction but maybe a better case for catheter directed thrombolysis.  6/28: transfer from [place].  Underwent VA ECMO and catheter directed thrombolysis 6/29 remain on VA ECMO, lysis catheter removed by IR.  Continue to have vaginal bleeding/clots 6/30 ECMO flow was decreased to 3 L, patient tolerated well, bedside echocardiogram showed mildly reduced RV function compared to prior.  Continued to have vaginal bleeding/clots 7/2 ECMO decannulated 7/4 milrinone  was stopped, off vasopressor support 7/5 tolerating spontaneous breathing trial but agitated and restless. Positive fever, blood cultures were obtained and patient was started on antibiotic therapy with Vancomycin  and cefepime .  Blood cultures positive for staphylococcus epidermidis and hominis.  07/06 patient liberated from mechanical ventilation.  7/7 stable on RA, off pressors   07/09 transfer to [facility]  09/10 consulted ID for bacteremia, patient no longer needs CIR, and plan for  discharge home with home health services if possible.  Pending sensitivities on staph hominis, if different from staph epi, will consider a contamination and antibiotic therapy can be stopped. If same, will need further work up with TEE. Discussed with ID.   Assessment and Plan: Acute pulmonary embolism with acute cor pulmonale, unspecified pulmonary embolism type (HCC) Massive pulmonary embolism, requiring catheter directed thrombolysis.  Acute deep vein thrombosis left popliteal, left posterior tibial and peroneal veins.  SP VA ECMO and shock  Patient has been transitioned over to apixaban . No shortness of breath. 11/03/2023: Patient remained stable.  Staph epi  and hominis bacteremia: Central lines have been removed.  Follow up cultures with no growth Suspect contaminant Continue vancomycin  IV Case discussed with ID, plan to check sensitivities on staph hominis if different than staph epi, will consider a contaminant and can discontinue antibiotics, if the same will need further workup with TEE.   Hypernatremia: - Resolved. - Last sodium was 140.    Hypokalemia: - Resolved. -Last potassium was 3.8.    Chronic anemia: - Normocytic. - Continue to follow.  Thrombocytopenia: - Resolved.    Leukocytosis: - Likely reactive. - Last WBC was 16. - Repeat CBC in the morning.  - 2 initial blood cultures were positive for Staph epidermidis, suspect contaminant.  Abnormal uterine bleeding Continue with megace  and follow up with GYN as outpatient Currently with no active bleeding.   Obesity, class 2 Calculated BMI is 36.   Hypothyroidism Continue with levothyroxine    Hypertension Continue blood pressure monitoring.       Subjective: - No new complaints. No shortness of breath No chest pain.    Physical Exam: Vitals:   11/03/23 0051 11/03/23 0506 11/03/23  0734 11/03/23 1149  BP: 106/75 104/67 114/88 108/87  Pulse: 74 74 88 95  Resp: 16 18 16 16   Temp: 98.2 F  (36.8 C) 98.3 F (36.8 C) 98.4 F (36.9 C) 98.6 F (37 C)  TempSrc: Oral Oral Oral Oral  SpO2: 100% 100% 98% 100%  Weight:  101.4 kg    Height:       General Condition: Patient is obese.  Not in any distress.  Awake and alert.  Right lower extremity is greater than left lower extremity. HEENT: Patient is pale. Neck: Supple. Lungs: Clear to auscultation. CVs: S1-S2. Abdomen: Soft and nontender. Neuro: Awake and alert. Extremities: Right lower extremity is greater in size than left lower extremity.    Data Reviewed:    Family Communication:   Disposition: Status is: Inpatient Remains inpatient appropriate because: pending sensitivities on cultures   Planned Discharge Destination: Home     Author: Leatrice LILLETTE Chapel, MD 11/03/2023 5:36 PM  For on call review [email or web address].

## 2023-11-03 NOTE — Plan of Care (Signed)
  Problem: Education: Goal: Knowledge of General Education information will improve Description: Including pain rating scale, medication(s)/side effects and non-pharmacologic comfort measures Outcome: Progressing   Problem: Health Behavior/Discharge Planning: Goal: Ability to manage health-related needs will improve Outcome: Progressing   Problem: Clinical Measurements: Goal: Ability to maintain clinical measurements within normal limits will improve Outcome: Progressing Goal: Will remain free from infection Outcome: Progressing Goal: Diagnostic test results will improve Outcome: Progressing Goal: Respiratory complications will improve Outcome: Progressing Goal: Cardiovascular complication will be avoided Outcome: Progressing   Problem: Nutrition: Goal: Adequate nutrition will be maintained Outcome: Progressing   Problem: Coping: Goal: Level of anxiety will decrease Outcome: Progressing   Problem: Elimination: Goal: Will not experience complications related to bowel motility Outcome: Progressing Goal: Will not experience complications related to urinary retention Outcome: Progressing   Problem: Pain Managment: Goal: General experience of comfort will improve and/or be controlled Outcome: Progressing   Problem: Safety: Goal: Ability to remain free from injury will improve Outcome: Progressing   Problem: Skin Integrity: Goal: Risk for impaired skin integrity will decrease Outcome: Progressing   Problem: Education: Goal: Ability to describe self-care measures that may prevent or decrease complications (Diabetes Survival Skills Education) will improve Outcome: Progressing   Problem: Fluid Volume: Goal: Ability to maintain a balanced intake and output will improve Outcome: Progressing   Problem: Health Behavior/Discharge Planning: Goal: Ability to identify and utilize available resources and services will improve Outcome: Progressing Goal: Ability to manage  health-related needs will improve Outcome: Progressing

## 2023-11-03 NOTE — Progress Notes (Signed)
 [facility] night cross cover note:   Pt is c/o some anxiety and conveys that she takes low dose prn xanax  as an outpatient for this.   Per my initial review of her outpatient medrec, I did not see inclusion of xanax .   I have subsequently ordered Xanax  0.25 mg po BID prn for anxiety.     Eva Pore, DO Hospitalist

## 2023-11-03 NOTE — Discharge Summary (Signed)
 Physician Discharge Summary  Patient ID: Anita Aguirre MRN: [id number] DOB/AGE: 1976/03/29 48 y.o.  Admit date: 10/19/2023 Discharge date: 11/03/2023    Discharge Diagnoses:                                                                        DISCHARGE PLAN BY DIAGNOSIS   #Acute Bilateral PE with with CT evidence of right heart strain (RV/LV Ratio = 1.4)  Presented with acute onset chest pain and dyspnea. As far as etiology of PE, BMI is a risk factor as well as use of birth control, and possible underlying clotting disorder.    -Hemodynamic support with IVF + vasopressors  -Start Heparin  gtt -Obtain hypercoagulable work-up  -Follow Troponin, BNP, serial EKG  -Obtain TTEcho  -Obtain Bilateral US  DVT -Consult IR/Vascular for  Thromobolysis vs. Embolectomy and IVC Filter placement if appropriate    #Acute Hypoxic Respiratory Failure in the setting of Acute Bilateral PE -Supplemental O2 for Respiratory Stability goal >92% -High risk for intubation -Follow intermittent Chest X-ray & ABG as needed   #Leukocytosis likely reactive in the setting of PE No obvious source of infection, chest x-ray and UA negative -F/u cultures, trend lactic/ PCT -Monitor WBC/ fever curve -Hold antibiotics for now pending cultures -Pressors for MAP goal >65 -Strict I/O's   #Elevated Troponin likely demand in the setting of PE as above #Hypertension #HLD  -trend troponin until peaked -Heparin  gtt for now -obtain Coox and start Milrinone  low dose  -Hold home meds for now -Obtain 2D echo   #Abnormal Uterine Bleeding in the setting of Uterine Fibroids Long hx of AUB in the setting of uterine Fibroids -Failed IUD and was started on Depo Provera  for bleeding control -Hold provera  -trend H&H q6  -transfuse for hgb <8 -Discussed with on call OBGYN here who agrees with proceeding with thrombolytics for PE treatment but in the event of bleeding complications, pt may need embolization per IR or worse  case scenario hyterectomy for excessive or life treatening uterine bleeding. Please consult OBGYN oncall at [facility] on arrival    #Hypothryoidism -Check TSH, Free T4 -Continue home synthroid    #Anxiety and Depression #PTSD #ADD -Hold Ritalin                  DISCHARGE SUMMARY   Anita Aguirre is a 48 y.o. y/o female with a PMH of HTN, hypothyroidism, anxiety and depression, ADD, PTSD, mastitis, migraine headache without complication, and uterine fibroid with active bleeding who presented to the ED with chief complaints of worsening shortness of breath and chest pain.   Patient report onset of symptoms for almost a week, which has progressively worsened today with associated chest pain and difficulty ambulating.  She has history of uterine fibroid with intermittent episode of heavy bleeding.  She was started on Depo-Provera  about 2 years ago which she has been on and off due to active bleeding.  Patient complained of left calf pain about 4 days ago which is resolved but remained short of breath with moderate intrauterine bleeding.   ED Course: Initial vital signs showed HR of 114 beats/minute, BP 125/85 mm Hg, the RR 35 breaths/minute, and the oxygen saturation 94% on 2 L and a temperature  of 98.42F (37.1C). Pertinent Labs/Diagnostics Findings: Na+/ K+: 140/3.4.  Glucose: 109  WBC: 15.2 K/L without bands or neutrophil predominance  Hgb/Hct: 9.8/30.9  Lactic acid: 3.4 COVID PCR: Negative,  troponin: 393 CTA showed bilateral PE with severe clot burden and CT evidence of right heart strain Medication administered in the ED: Heparin  bolus 4000 units with continuous infusion.  1 L LR bolus Disposition:PCCM consulted for ICU admission.    Discussed with the IR physician on call, Dr. Philip. Given increase in lactic acid yesterday, and limited options at [facility], case discussed with Dr. Gardenia and decision made to transfer to [facility] where she could be offered mechanical support  with ECMO if necessary. Both advanced heart failure and IR to evaluate the patient on transfer to [facility]. After discussion with Dr. Gardenia, we decided to initiate the patient on a low dose infusion of milrinone  for ionotropic support prior to transfer. The case discussed with Dr. Maree who accepted her for a transfer to 2H under the care of PCCM.           SIGNIFICANT EVENTS 6/27: Admitted to ICU with acute bilateral PE with severe clot burden and right heart strain.  Vascular consulted who deferred to IR.  IR recommends transfer to [facility] for evaluation of catheter directed thrombolysis and ECMO watch. Accepted for transfer to [facility] 2H, pending transfer  Discharge Exam: General:resting in bed, in NAD. Neuro: A&O x 3, non-focal.  HEENT: [place]/AT. PERRL, sclerae anicteric. Cardiovascular: RRR, no M/R/G.  Lungs: Respirations even and unlabored.  CTA bilaterally, No W/R/R.  Abdomen: BS x 4, soft, NT/ND.  Musculoskeletal: No gross deformities, no edema.  Skin: Intact, warm, no rashes.   Vitals:   10/20/23 0200 10/20/23 0215 10/20/23 0230 10/20/23 0320  BP: 95/76     Pulse: (!) 109 (!) 102 (!) 108   Resp: (!) 30 (!) 44 (!) 39   Temp: 100.2 F (37.9 C) 100.2 F (37.9 C)  (P) 98.4 F (36.9 C)  TempSrc:    (P) Oral  SpO2: 99% 100% 99%   Weight:    (P) 106.2 kg  Height:       Discharge Labs  BMET Recent Labs  Lab 10/29/23 1708 10/30/23 0456 10/31/23 0902 11/01/23 0400 11/02/23 0419 11/03/23 0450  NA 142 142 140 139 140  --   K 3.2* 3.6 4.5 3.6 3.8  --   CL 117* 115* 111 109 109  --   CO2 18* 19* 18* 18* 22  --   GLUCOSE 117* 105* 120* 108* 103*  --   BUN 17 14 12 12 12   --   CREATININE 0.66 0.63 0.75 0.72 0.83  --   CALCIUM  8.5* 8.5* 9.1 8.8* 9.1  --   MG  --  2.1 2.0 2.0 2.2 2.2    CBC Recent Labs  Lab 10/31/23 0902 11/01/23 0400 11/02/23 0419  HGB 9.5* 9.2* 9.6*  HCT 30.3* 29.3* 30.7*  WBC 22.7* 21.4* 16.0*  PLT 165 207 338     Anti-Coagulation No results for input(s): INR in the last 168 hours.    Allergies as of 10/20/2023       Reactions   Wellbutrin [bupropion] Hives, Itching, Other (See Comments)   Skin crawling sensation        Medication List     ASK your doctor about these medications    KP Ferrous Gluconate 324 (37.5 Fe) MG Tabs Generic drug: Ferrous Gluconate Take 1 tablet by  mouth daily.   levonorgestrel  20 MCG/24HR IUD Commonly known as: MIRENA  1 Intra Uterine Device (1 each total) by Intrauterine route once for 1 dose.   levothyroxine  88 MCG tablet Commonly known as: SYNTHROID  Take 88 mcg by mouth daily before breakfast.   losartan-hydrochlorothiazide 50-12.5 MG tablet Commonly known as: HYZAAR Take 1 tablet by mouth daily.   medroxyPROGESTERone  5 MG tablet Commonly known as: PROVERA  Take 5 mg by mouth See admin instructions. Take 1 tablet (5mg ) by mouth three times daily for  5-10 days for heavy bleeding.   methylphenidate 10 MG tablet Commonly known as: RITALIN Take 20 mg by mouth 2 (two) times daily.   SM Vitamin D3 50 MCG (2000 UT) Caps Generic drug: Cholecalciferol Take 2,000 Units by mouth daily.         Disposition: Anita Aguirre  Discharged Condition: Anita Aguirre has met maximum benefit of inpatient care and is medically stable and cleared for discharge.  Patient is pending follow up as above.      Time spent on disposition:  Greater than 45 minutes.      Anita Nose, DNP, CCRN, FNP-C, AGACNP-BC Acute Care & Family Nurse Practitioner  [facility]  See Amion for personal pager PCCM on call pager [phone] until 7 am

## 2023-11-03 NOTE — Progress Notes (Signed)
 Patient is AXOX4 and remains on room air with no clinical signs of distress or complaints of pain at this time.   Safety measures are in place, call light is within reach and 4P's addressed.

## 2023-11-03 NOTE — Plan of Care (Signed)
  Problem: Education: Goal: Knowledge of General Education information will improve Description: Including pain rating scale, medication(s)/side effects and non-pharmacologic comfort measures Outcome: Progressing   Problem: Health Behavior/Discharge Planning: Goal: Ability to manage health-related needs will improve Outcome: Progressing   Problem: Clinical Measurements: Goal: Ability to maintain clinical measurements within normal limits will improve Outcome: Progressing Goal: Will remain free from infection Outcome: Progressing Goal: Diagnostic test results will improve Outcome: Progressing Goal: Respiratory complications will improve Outcome: Progressing Goal: Cardiovascular complication will be avoided Outcome: Progressing   Problem: Nutrition: Goal: Adequate nutrition will be maintained Outcome: Progressing   Problem: Coping: Goal: Level of anxiety will decrease Outcome: Progressing   Problem: Elimination: Goal: Will not experience complications related to bowel motility Outcome: Progressing Goal: Will not experience complications related to urinary retention Outcome: Progressing   Problem: Pain Managment: Goal: General experience of comfort will improve and/or be controlled Outcome: Progressing   Problem: Safety: Goal: Ability to remain free from injury will improve Outcome: Progressing   Problem: Skin Integrity: Goal: Risk for impaired skin integrity will decrease Outcome: Progressing   Problem: Education: Goal: Ability to describe self-care measures that may prevent or decrease complications (Diabetes Survival Skills Education) will improve Outcome: Progressing Goal: Individualized Educational Video(s) Outcome: Progressing   Problem: Fluid Volume: Goal: Ability to maintain a balanced intake and output will improve Outcome: Progressing   Problem: Health Behavior/Discharge Planning: Goal: Ability to identify and utilize available resources and services  will improve Outcome: Progressing Goal: Ability to manage health-related needs will improve Outcome: Progressing   Problem: Metabolic: Goal: Ability to maintain appropriate glucose levels will improve Outcome: Progressing   Problem: Nutritional: Goal: Maintenance of adequate nutrition will improve Outcome: Progressing Goal: Progress toward achieving an optimal weight will improve Outcome: Progressing   Problem: Skin Integrity: Goal: Risk for impaired skin integrity will decrease Outcome: Progressing   Problem: Tissue Perfusion: Goal: Adequacy of tissue perfusion will improve Outcome: Progressing

## 2023-11-04 LAB — MAGNESIUM: Magnesium: 2.2 mg/dL (ref 1.7–2.4)

## 2023-11-04 MED ORDER — MEGESTROL ACETATE 40 MG PO TABS: 40.0000 mg | ORAL_TABLET | Freq: Every day | ORAL | 0 refills | Status: AC

## 2023-11-04 MED ORDER — MEGESTROL ACETATE 40 MG PO TABS: 40.0000 mg | ORAL_TABLET | Freq: Every day | ORAL | 0 refills | Status: DC

## 2023-11-04 MED ORDER — POLYETHYLENE GLYCOL 3350 17 G PO PACK: 17.0000 g | PACK | Freq: Every day | ORAL | 0 refills | Status: DC | PRN

## 2023-11-04 MED ORDER — BETHANECHOL CHLORIDE 10 MG PO TABS: 10.0000 mg | ORAL_TABLET | Freq: Three times a day (TID) | ORAL | 0 refills | Status: AC

## 2023-11-04 MED ORDER — GLUCERNA SHAKE PO LIQD: 237.0000 mL | Freq: Two times a day (BID) | ORAL | 2 refills | Status: AC

## 2023-11-04 MED ORDER — ADULT MULTIVITAMIN W/MINERALS CH: 1.0000 | ORAL_TABLET | Freq: Every day | ORAL | 0 refills | Status: DC

## 2023-11-04 MED ORDER — APIXABAN 5 MG PO TABS: 5.0000 mg | ORAL_TABLET | Freq: Two times a day (BID) | ORAL | 1 refills | Status: DC

## 2023-11-04 MED ORDER — BETHANECHOL CHLORIDE 10 MG PO TABS: 10.0000 mg | ORAL_TABLET | Freq: Three times a day (TID) | ORAL | 0 refills | Status: DC

## 2023-11-04 MED ORDER — GLUCERNA SHAKE PO LIQD: 237.0000 mL | Freq: Two times a day (BID) | ORAL | 2 refills | Status: DC

## 2023-11-04 MED ORDER — APIXABAN 5 MG PO TABS: 5.0000 mg | ORAL_TABLET | Freq: Two times a day (BID) | ORAL | 1 refills | Status: AC

## 2023-11-04 NOTE — Discharge Summary (Signed)
 Physician Discharge Summary  Patient ID: Anita Aguirre MRN: [id number] DOB/AGE: 1975-09-30 48 y.o.  Admit date: 10/20/2023 Discharge date: 11/04/2023  Admission Diagnoses:  Discharge Diagnoses:  Principal Problem:   Acute pulmonary embolism with acute cor pulmonale, unspecified pulmonary embolism type (HCC) Active Problems:   Hypertension   Hypothyroidism   Obesity, class 2   Abnormal uterine bleeding   Chronic anemia   Hypernatremia RIGHT HEART FAILURE   Discharged Condition: stable  Hospital Course:  Patient is a 48 year old female with past medical history significant for hypertension, diffuse cystic mastopathy, hypothyroidism, bleeding uterine fibroids, ADD and arthritis.  Patient was admitted with submassive pulmonary embolism.  Patient underwent VA ECMO and catheter directed thrombolysis.  Patient was initially under the ICU team.  Patient was initially managed with heparin  drip, and later contrast return to Eliquis .  Patient will follow-up with hematology/oncology, pulmonary and vascular surgery team.  During the hospital stay, 2 blood cultures were said to have grown MRSE, however, further workup revealed that was contaminant.  Patient was initially on IV vancomycin , but later was stopped.     Significant hospital events: At [facility], patient was started on Heparin  drip and Milrinone  drip.   Case was discussed with interventional radiology team by Dr Isadora and patient was not a good candidate for clot extraction but a better case for catheter directed thrombolysis.  6/28: transfer from [facility] to [facility].  Patient underwent VA ECMO and catheter directed thrombolysis 6/29 - remained on [place] ECMO, lysis catheter removed by IR.  Continue to have vaginal bleeding/clots 6/30 ECMO flow was decreased to 3 L, patient tolerated well, bedside echocardiogram showed mildly reduced RV function compared to prior.  Continued to have vaginal  bleeding/clots 7/2 ECMO decannulated 7/4 milrinone  was stopped, off vasopressor support 7/5 tolerating spontaneous breathing trial but agitated and restless. Positive fever, blood cultures were obtained and patient was started on antibiotic therapy with Vancomycin  and cefepime .  Blood cultures positive for staphylococcus epidermidis and hominis.  07/06 patient liberated from mechanical ventilation.  7/7 stable on RA, off pressors 10/31/2023-transferred from the ICU team to the hospitalist team      Acute pulmonary embolism with acute cor pulmonale, unspecified pulmonary embolism type (HCC) -Submassive pulmonary embolism, requiring catheter directed thrombolysis.  -Acute deep vein thrombosis left popliteal, left posterior tibial and peroneal veins.  -SP VA ECMO and shock  -Anticoagulation was slated transition to apixaban .   -Symptoms have resolved significantly. - Patient will be discharged back home on apixaban . - Patient will follow-up with primary care provider, vascular surgery team and hematology/oncology team on discharge.  Staph epi  and hominis bacteremia: -Deemed to be contaminant.   Hypernatremia: - Resolved. - Last sodium was 140.     Hypokalemia: - Resolved. -Last potassium was 3.8.     Chronic anemia: - Normocytic. - Continue to follow.   Thrombocytopenia: - Resolved.     Leukocytosis: - Likely reactive. - Last WBC was 16. - PCP to monitor.   Abnormal uterine bleeding Continue with megace  and follow up with GYN as outpatient Currently with no active bleeding.    Obesity, class 2 Calculated BMI is 36.    Hypothyroidism Continue with levothyroxine     Hypertension Continue blood pressure monitoring.   Right heart failure: -Cor pulmonale/Submassive PE.      Consults: vascular surgery and interventional radiology and heart failure team.  Significant Diagnostic Studies:  CT angiography with contrast revealed: 1. Bilateral pulmonary emboli with  severe  clot burden. 2. CT evidence of RIGHT ventricular strain. Positive for acute PE with CT evidence of right heart strain (RV/LV Ratio = 1.4) consistent with at least submassive (intermediate risk) PE. The presence of right heart strain has been associated with an increased risk of morbidity and mortality. Please refer to the Code PE Focused order set in EPIC. 3. Small pulmonary infarction in the RIGHT lower lobe. 4. Moderate hiatal hernia.   CT chest, abdomen and pelvis with contrast revealed: 1. Grossly similar bilateral pulmonary emboli with evidence of right heart strain compared with CT 10/19/2023. (RV/LV ratio 1.5). 2. Nonspecific patchy airspace opacities in the lingula and bilateral lower lobes. Differential considerations include infarct, atelectasis, or pneumonia. 3. Small right pleural effusion. 4. Marked irregular enlargement of the uterus. This is suboptimally evaluated with CT. On ultrasound 10/07/2021 several large intramural fibroids were noted which may account for the CT findings. 5. Cholelithiasis.  No evidence of acute cholecystitis. 6. Lines and tubes as described.   Echocardiogram done on 10/30/2023 revealed: 1. Left ventricular ejection fraction, by estimation, is 60 to 65%. The  left ventricle has normal function. The left ventricle has no regional  wall motion abnormalities. Left ventricular diastolic parameters were  normal.   2. Right ventricular systolic function is normal. The right ventricular  size is normal.   3. The mitral valve is normal in structure. Trivial mitral valve  regurgitation. No evidence of mitral stenosis.   4. The aortic valve is tricuspid. Aortic valve regurgitation is mild. No  aortic stenosis is present.   5. Aortic dilatation noted. There is mild dilatation of the ascending  aorta, measuring 40 mm.    Echocardiogram done on 10/20/2023 revealed:  1. Findings are consistent with acute cor pulmonale in this patient with   known submassive PE. Right ventricular systolic function is moderately  reduced. The right ventricular size is severely enlarged.   2. Left ventricular ejection fraction, by estimation, is 65 to 70%. The  left ventricle has normal function. The left ventricle has no regional  wall motion abnormalities. Indeterminate diastolic filling due to E-A  fusion. There is the interventricular  septum is flattened in systole and diastole, consistent with right  ventricular pressure and volume overload.   3. The mitral valve is grossly normal. No evidence of mitral valve  regurgitation. No evidence of mitral stenosis.   4. The aortic valve is tricuspid. Aortic valve regurgitation is not  visualized. No aortic stenosis is present.   Conclusion(s)/Recommendation(s): Findings consistent with Cor Pulmonale.   Treatments:    Procedures: - Pelvic arteriography, bilateral uterine artery embolization for fibroids done on 10/20/2023.  -Pulmonary arteriography and catheter directed thrombolysis done on 10/20/2023.  Discharge Exam: Blood pressure 123/82, pulse 92, temperature 98.6 F (37 C), temperature source Oral, resp. rate 18, height 5' 7 (1.702 m), weight 101.1 kg, SpO2 99%.   Disposition: Discharge disposition: 01-Home or Self Care       Discharge Instructions     AMB Referral to Deep Vein Thrombosis Clinic   Complete by: As directed    Is patient currently on anticoagulation?: Yes   Diet - low sodium heart healthy   Complete by: As directed    Discharge wound care:   Complete by: As directed    Continue current wound care regimen.   Increase activity slowly   Complete by: As directed       Allergies as of 11/04/2023       Reactions   Wellbutrin [bupropion]  Hives, Itching, Other (See Comments)   Skin crawling sensation   Heparin     HIT        Medication List     STOP taking these medications    aspirin 81 MG chewable tablet   ibuprofen  200 MG tablet Commonly known  as: ADVIL    levonorgestrel  20 MCG/24HR IUD Commonly known as: MIRENA    losartan-hydrochlorothiazide 50-12.5 MG tablet Commonly known as: HYZAAR   medroxyPROGESTERone  5 MG tablet Commonly known as: PROVERA        TAKE these medications    apixaban  5 MG Tabs tablet Commonly known as: ELIQUIS  Take 1 tablet (5 mg total) by mouth 2 (two) times daily.   bethanechol  10 MG tablet Commonly known as: URECHOLINE  Take 1 tablet (10 mg total) by mouth 3 (three) times daily for 14 days.   feeding supplement (GLUCERNA SHAKE) Liqd Take 237 mLs by mouth 2 (two) times daily between meals. Start taking on: November 05, 2023   KP Ferrous Gluconate 324 (37.5 Fe) MG Tabs Generic drug: Ferrous Gluconate Take 1 tablet by mouth daily.   levothyroxine  88 MCG tablet Commonly known as: SYNTHROID  Take 88 mcg by mouth daily before breakfast.   megestrol  40 MG tablet Commonly known as: MEGACE  Take 1 tablet (40 mg total) by mouth daily. Start taking on: November 05, 2023   methylphenidate 10 MG tablet Commonly known as: RITALIN Take 20 mg by mouth 2 (two) times daily.   multivitamin with minerals Tabs tablet Take 1 tablet by mouth daily. Start taking on: November 05, 2023   polyethylene glycol 17 g packet Commonly known as: MIRALAX  / GLYCOLAX  Take 17 g by mouth daily as needed for moderate constipation.   SM Vitamin D3 50 MCG (2000 UT) Caps Generic drug: Cholecalciferol Take 2,000 Units by mouth daily.               Discharge Care Instructions  (From admission, onward)           Start     Ordered   11/04/23 0000  Discharge wound care:       Comments: Continue current wound care regimen.   11/04/23 1358            Follow-up Information     Estefana Candy MD Follow up.   Why: Hospital follow up appt scheduled for 11/12/2023 at 3pm please call if you are unable to keep appt to reschedule Contact information: [facility] phone: [phone] fax  [phone]                Time spent: 35 minutes.  SignedBETHA Leatrice LILLETTE Aguirre 11/04/2023, 1:58 PM

## 2023-11-04 NOTE — TOC Transition Note (Signed)
 Transition of Care (TOC) - Discharge Note Rayfield Gobble RN, BSN Inpatient Care Management Unit 4E- RN Case Manager See Treatment Team for direct phone # [phone] weekend coverage  Patient Details  Name: CNIYAH SPROULL MRN: [id number] Date of Birth: 06-15-1975  Transition of Care [facility]) CM/SW Contact:  Gobble Rayfield Hurst, RN Phone Number: 11/04/2023, 3:56 PM   Clinical Narrative:    Pt stable for transition home today, family at bedside to transport home.   Pt new to Eliquis , no insurance.  CM spoke with pt and provided Eliquis  30day free starter card to use at phramacy, pt also provided [facility] letter to use if needed on other meds.  Also discussed with pt that she will need to apply for pt. Assistance w/ Eliquis  for ongoing medication needs- application given to pt for Eliquis  pt assistance- pt voiced understanding that she will need to take application with her to f/u with MD.    Final next level of care: Home/Self Care Barriers to Discharge: No Barriers Identified   Patient Goals and CMS Choice Patient states their goals for this hospitalization and ongoing recovery are:: wants to continue working   Choice offered to / list presented to : NA      Discharge Placement               home        Discharge Plan and Services Additional resources added to the After Visit Summary for   In-house Referral: Financial Counselor Discharge Planning Services: CM Consult, MATCH Program, Medication Assistance Post Acute Care Choice: NA          DME Arranged: N/A DME Agency: NA       HH Arranged: NA HH Agency: NA        Social Drivers of Health (SDOH) Interventions SDOH Screenings   Food Insecurity: Food Insecurity Present (10/20/2023)  Housing: High Risk (10/20/2023)  Transportation Needs: Unmet Transportation Needs (10/20/2023)  Utilities: Not At Risk (10/20/2023)  Social Connections: Unknown (10/20/2023)  Tobacco Use: Medium Risk (10/24/2023)     Readmission Risk  Interventions    11/04/2023    3:56 PM  Readmission Risk Prevention Plan  Post Dischage Appt Complete  Medication Screening Complete  Transportation Screening Complete

## 2023-11-05 LAB — CULTURE, BLOOD (ROUTINE X 2): Special Requests: ADEQUATE

## 2023-11-06 LAB — SEROTONIN RELEASE ASSAY (SRA)
SRA .2 IU/mL UFH Ser-aCnc: 22 % — ABNORMAL HIGH (ref 0–20)
SRA .2 IU/mL UFH Ser-aCnc: 13 % (ref 0–20)
SRA 100IU/mL UFH Ser-aCnc: 5 % (ref 0–20)
SRA 100IU/mL UFH Ser-aCnc: 6 % (ref 0–20)

## 2023-11-09 ENCOUNTER — Encounter: Payer: Self-pay | Admitting: Emergency Medicine

## 2023-11-09 ENCOUNTER — Emergency Department
Admission: EM | Admit: 2023-11-09 | Discharge: 2023-11-09 | Disposition: A | Discharge status: Home / Self Care | Payer: Self-pay | Attending: Emergency Medicine | Admitting: Emergency Medicine

## 2023-11-09 DIAGNOSIS — R339 Retention of urine, unspecified: Secondary | ICD-10-CM | POA: Insufficient documentation

## 2023-11-09 DIAGNOSIS — R1030 Lower abdominal pain, unspecified: Secondary | ICD-10-CM | POA: Insufficient documentation

## 2023-11-09 DIAGNOSIS — R338 Other retention of urine: Secondary | ICD-10-CM

## 2023-11-09 DIAGNOSIS — I10 Essential (primary) hypertension: Secondary | ICD-10-CM | POA: Insufficient documentation

## 2023-11-09 LAB — BASIC METABOLIC PANEL WITH GFR
Anion gap: 11 (ref 5–15)
BUN: 12 mg/dL (ref 6–20)
CO2: 19 mmol/L — ABNORMAL LOW (ref 22–32)
Calcium: 9.6 mg/dL (ref 8.9–10.3)
Chloride: 107 mmol/L (ref 98–111)
Creatinine, Ser: 0.85 mg/dL (ref 0.44–1.00)
GFR, Estimated: >60 mL/min (ref >60)
Glucose, Bld: 101 mg/dL — ABNORMAL HIGH (ref 70–99)
Potassium: 3.9 mmol/L (ref 3.5–5.1)
Sodium: 137 mmol/L (ref 135–145)

## 2023-11-09 LAB — CBC WITH DIFFERENTIAL/PLATELET
Abs Immature Granulocytes: 0.05 K/uL (ref 0.00–0.07)
Basophils Absolute: 0 K/uL (ref 0.0–0.1)
Basophils Relative: 0 %
Eosinophils Absolute: 0.3 K/uL (ref 0.0–0.5)
Eosinophils Relative: 3 %
HCT: 33.8 % — ABNORMAL LOW (ref 36.0–46.0)
Hemoglobin: 10.4 g/dL — ABNORMAL LOW (ref 12.0–15.0)
Immature Granulocytes: 1 %
Lymphocytes Relative: 16 %
Lymphs Abs: 1.5 K/uL (ref 0.7–4.0)
MCH: 26.5 pg (ref 26.0–34.0)
MCHC: 30.8 g/dL (ref 30.0–36.0)
MCV: 86.2 fL (ref 80.0–100.0)
Monocytes Absolute: 0.7 K/uL (ref 0.1–1.0)
Monocytes Relative: 7 %
Neutro Abs: 7 K/uL (ref 1.7–7.7)
Neutrophils Relative %: 73 %
Platelets: 719 K/uL — ABNORMAL HIGH (ref 150–400)
RBC: 3.92 MIL/uL (ref 3.87–5.11)
RDW: 18.8 % — ABNORMAL HIGH (ref 11.5–15.5)
WBC: 9.5 K/uL (ref 4.0–10.5)
nRBC: 0 % (ref 0.0–0.2)

## 2023-11-09 LAB — URINALYSIS, ROUTINE W REFLEX MICROSCOPIC
Bacteria, UA: NONE SEEN
Bilirubin Urine: NEGATIVE
Glucose, UA: NEGATIVE mg/dL
Hgb urine dipstick: NEGATIVE
Ketones, ur: NEGATIVE mg/dL
Leukocytes,Ua: NEGATIVE
Nitrite: NEGATIVE
Protein, ur: NEGATIVE mg/dL
Specific Gravity, Urine: 1.004 — ABNORMAL LOW (ref 1.005–1.030)
Squamous Epithelial / HPF: 0 /HPF (ref 0–5)
pH: 5 (ref 5.0–8.0)

## 2023-11-09 NOTE — ED Triage Notes (Signed)
 Pt presents to the ED via [facility] with complaints of lower abdominal pain with urinary retention. States the pain started last night and worsened over the course of this AM. Pt with visible discomfort noted. A&Ox4 at this time. Denies CP or SOB.    VSS with EMS

## 2023-11-09 NOTE — Discharge Instructions (Addendum)
 You have acute urinary retention which is likely secondary to your recent hospitalization and probably from some of your sedation medications.  We will leave the Foley catheter in place and have you follow-up with urology outpatient.  He will likely stay in for 1 or 2 weeks while we allow time for your urinary tract to heal.  I have sent a referral to urology as well as listed the clinic in your paperwork for you to call as needed to schedule an appointment.

## 2023-11-09 NOTE — ED Provider Notes (Signed)
 [facility] Provider Note    Event Date/Time   First MD Initiated Contact with Patient 11/09/23 [phone]     (approximate)   History   Abdominal Pain and Urinary Retention   HPI CEIL RODERICK is a 49 y.o. female with history of bilateral PE, HTN presenting today for abdominal pain.  Patient stated that she noted difficulty urinating starting 12 hours ago.  Has not been able to but feels lower abdominal pain.  Progressively getting worse overnight.  Feeling short of breath simply from pain symptom.  Denies pain anywhere else in her abdomen.  No nausea, vomiting, diarrhea.  Chart review: Patient had recent admission to the hospital for submassive PE requiring VA ECMO and catheter directed thrombolysis.  Patient did have a catheter in place during her hospitalization.     Physical Exam   Triage Vital Signs: ED Triage Vitals  Encounter Vitals Group     BP      Girls Systolic BP Percentile      Girls Diastolic BP Percentile      Boys Systolic BP Percentile      Boys Diastolic BP Percentile      Pulse      Resp      Temp      Temp src      SpO2      Weight      Height      Head Circumference      Peak Flow      Pain Score      Pain Loc      Pain Education      Exclude from Growth Chart     Most recent vital signs: Vitals:   11/09/23 0454  BP: (!) 146/91  Pulse: 90  Resp: 16  Temp: 98.3 F (36.8 C)  SpO2: 97%    I have reviewed the vital signs. General:  Awake, alert, no acute distress. Head:  Normocephalic, Atraumatic. EENT:  PERRL, EOMI, Oral mucosa pink and moist, Neck is supple. Cardiovascular: Regular rate, 2+ distal pulses. Respiratory:  Normal respiratory effort, symmetrical expansion, no distress.   Abdomen: Tender to palpation in the suprapubic region, otherwise soft Extremities:  Moving all four extremities through full ROM without pain.   Neuro:  Alert and oriented.  Interacting appropriately.   Skin:  Warm, dry, no rash.    Psych: Appropriate affect.    ED Results / Procedures / Treatments   Labs (all labs ordered are listed, but only abnormal results are displayed) Labs Reviewed  CBC WITH DIFFERENTIAL/PLATELET - Abnormal; Notable for the following components:      Result Value   Hemoglobin 10.4 (*)    HCT 33.8 (*)    RDW 18.8 (*)    Platelets 719 (*)    All other components within normal limits  BASIC METABOLIC PANEL WITH GFR - Abnormal; Notable for the following components:   CO2 19 (*)    Glucose, Bld 101 (*)    All other components within normal limits  URINALYSIS, ROUTINE W REFLEX MICROSCOPIC     EKG    RADIOLOGY    PROCEDURES:  Critical Care performed: No  Procedures   MEDICATIONS ORDERED IN ED: Medications - No data to display   IMPRESSION / MDM / ASSESSMENT AND PLAN / ED COURSE  I reviewed the triage vital signs and the nursing notes.  Differential diagnosis includes, but is not limited to, acute urinary retention, UTI, AKI  Patient's presentation is most consistent with acute complicated illness / injury requiring diagnostic workup.  Patient is a 48 year old female presenting today for sharp lower abdominal pain with acute urinary retention.  Has not been able to urinate in 12 hours.  Bedside ultrasound shows significantly distended bladder with greater than 1 L present.  Likely the source of all of her symptoms.  Foley catheter placed and will collect a UA along with CBC and BMP to rule out any evidence of AKI.  Patient was 1.3 L out in Foley catheter.  Complete symptomatic resolution.  Laboratory workup with reassuring CBC and BMP.  UA negative.  We will discharge with Foley catheter in place with a leg bag and outpatient urology follow-up.  Patient stable for discharge and given strict return precautions.  The patient is on the cardiac monitor to evaluate for evidence of arrhythmia and/or significant heart rate changes. Clinical Course as  of 11/09/23 0625  Fri Nov 09, 2023  0458 Significant abdominal distention with bedside ultrasound showing greater than 1 L in the bladder.  Unable to urinate.  Will place Foley catheter [DW]  0509 Foley catheter placed and patient noting complete resolution of symptoms. [DW]    Clinical Course User Index [DW] Malvina Alm DASEN, MD     FINAL CLINICAL IMPRESSION(S) / ED DIAGNOSES   Final diagnoses:  Acute urinary retention     Rx / DC Orders   ED Discharge Orders          Ordered    Ambulatory referral to Urology        11/09/23 0625             Note:  This document was prepared using Dragon voice recognition software and may include unintentional dictation errors.   Malvina Alm DASEN, MD 11/09/23 [phone]

## 2023-11-13 ENCOUNTER — Telehealth: Payer: Self-pay

## 2023-11-13 NOTE — Telephone Encounter (Signed)
 You saw the patient in the hospital. She is scheduled to see you in the office on 12/03/2023. Is this something you would handle or should she reach out to her PCP office?

## 2023-11-13 NOTE — Telephone Encounter (Signed)
 Copied from CRM [id number]. Topic: Clinical - Medical Advice >> Nov 13, 2023 11:05 AM Celestine FALCON wrote: Reason for CRM: Pt is requesting to see if the clinic can complete the pt's FMLA paperwork. Pt was in the hospital on 7/18 discharged on 8/15. Pt is also requesting patient assistance form to be completed for assistance with Eliquis . Pt has a new patient appt with Dr. Isadora on 8/11 at 1030 am in [place]. Please call the pt back at [phone] ok to leave a vm.

## 2023-11-14 NOTE — Telephone Encounter (Signed)
 I spoke with the patient. She said she talked to her PCP office and they are going to fill out the forms.  Nothing further needed.

## 2023-11-14 NOTE — Telephone Encounter (Signed)
>>   Nov 14, 2023  8:57 AM Anita Aguirre wrote: Patient verbalized understanding that Dr. Isadora cant fill out her FMLA paperwork until he sees her in office on 8/11 but patient states her job is on the line and she is not getting paid. Can Dr. Isadora see her any sooner?

## 2023-11-16 ENCOUNTER — Telehealth: Payer: Self-pay

## 2023-11-16 NOTE — Telephone Encounter (Signed)
 Pt called triage line this morning around 9am stating she was having pain in her bladder, pt went to the ER on 07/18 and got a cath put in. She has a NP appt on 08/12. Pt was informed that we would not be able to see her today due to no availably, pt asked if we could recommend something for pain, pt was advise that unfortunately since she is a new patient we would not be abl to send or recommend anything.pt was advise to go to her PCP, urgent care or ER for pain. Pt voiced understanding.

## 2023-11-22 ENCOUNTER — Ambulatory Visit: Payer: MEDICAID | Attending: Vascular Surgery | Admitting: Physician Assistant

## 2023-11-22 VITALS — BP 126/89 | HR 78 | Temp 97.9°F | Wt 220.8 lb

## 2023-11-22 DIAGNOSIS — S75002S Unspecified injury of femoral artery, left leg, sequela: Secondary | ICD-10-CM

## 2023-11-22 DIAGNOSIS — Z9281 Personal history of extracorporeal membrane oxygenation (ECMO): Secondary | ICD-10-CM

## 2023-11-22 NOTE — Progress Notes (Signed)
  POST OPERATIVE OFFICE NOTE    CC:  F/u for surgery  HPI:  Anita Aguirre is a 48 y.o. female who is here for staple removal.  She recently underwent ECMO decannulation, left iliofemoral embolectomy, and primary repair of the left common femoral and superficial femoral arteries on 10/24/2023 by Dr. Lanis.  She was originally on ECMO due to a large PE causing right heart strain.  She returns today for follow-up.  She says that she has been doing well since leaving the hospital.  She denies any issues with her left groin incision such as redness, swelling, or drainage.  Her incision does feel fairly uncomfortable because the staples are tugging her skin.  She denies any claudication or rest pain in the left lower extremity.   Allergies  Allergen Reactions   Wellbutrin [Bupropion] Hives, Itching and Other (See Comments)    Skin crawling sensation   Heparin      HIT    Current Outpatient Medications  Medication Sig Dispense Refill   cephALEXin (KEFLEX) 500 MG capsule CEPHALEXIN 500 MG CAPS     apixaban  (ELIQUIS ) 5 MG TABS tablet Take 1 tablet (5 mg total) by mouth 2 (two) times daily. 60 tablet 1   feeding supplement, GLUCERNA SHAKE, (GLUCERNA SHAKE) LIQD Take 237 mLs by mouth 2 (two) times daily between meals. 7110 mL 2   Ferrous Gluconate (KP FERROUS GLUCONATE) 324 (37.5 Fe) MG TABS Take 1 tablet by mouth daily.     levothyroxine  (SYNTHROID ) 88 MCG tablet Take 88 mcg by mouth daily before breakfast.     megestrol  (MEGACE ) 40 MG tablet Take 1 tablet (40 mg total) by mouth daily. 30 tablet 0   methylphenidate (RITALIN) 10 MG tablet Take 20 mg by mouth 2 (two) times daily.     Multiple Vitamin (MULTIVITAMIN WITH MINERALS) TABS tablet Take 1 tablet by mouth daily. 30 tablet 0   polyethylene glycol (MIRALAX  / GLYCOLAX ) 17 g packet Take 17 g by mouth daily as needed for moderate constipation. 14 each 0   SM VITAMIN D3 50 MCG (2000 UT) CAPS Take 2,000 Units by mouth daily.     No current  facility-administered medications for this visit.     ROS:  See HPI  Physical Exam:  Incision: Left groin incision well-healed without signs of infection or hematoma.  All staples removed.  Right groin venous sheath exit site healed Extremities: Left lower extremity well-perfused, palpable DP pulse Neuro: Intact motor and sensation of left lower extremity    Assessment/Plan:  This is a 48 y.o. female who is here for staple removal  - The patient recently underwent left iliofemoral thrombectomy, ECMO decannulation, and primary repair of the left CFA and SFA on 10/24/2023 -Her right groin venous sheath exit site is well-healed.  Her left groin incision is also well-healed without signs of infection or hematoma.  I have removed all staples today -Her left lower extremity is warm and well-perfused with a palpable DP pulse.  She denies any claudication or rest pain -She can follow-up with our office as needed   Ahmed Holster, PA-C [facility] [phone]   Call MD: [place]

## 2023-11-28 DIAGNOSIS — R52 Pain, unspecified: Secondary | ICD-10-CM | POA: Insufficient documentation

## 2023-11-28 DIAGNOSIS — R339 Retention of urine, unspecified: Secondary | ICD-10-CM | POA: Insufficient documentation

## 2023-12-03 ENCOUNTER — Encounter: Payer: Self-pay | Admitting: Student in an Organized Health Care Education/Training Program

## 2023-12-03 ENCOUNTER — Ambulatory Visit (INDEPENDENT_AMBULATORY_CARE_PROVIDER_SITE_OTHER): Payer: Self-pay | Admitting: Student in an Organized Health Care Education/Training Program

## 2023-12-03 VITALS — BP 130/84 | HR 90 | Temp 97.1°F | Ht 67.0 in | Wt 221.4 lb

## 2023-12-03 DIAGNOSIS — N939 Abnormal uterine and vaginal bleeding, unspecified: Secondary | ICD-10-CM

## 2023-12-03 DIAGNOSIS — I2699 Other pulmonary embolism without acute cor pulmonale: Secondary | ICD-10-CM

## 2023-12-03 NOTE — Progress Notes (Signed)
 Assessment & Plan:   #Bilateral pulmonary embolism  Recently admitted for high risk pulmonary embolism with obstructive shock requiring VA-ECMO and catheter directed thrombolysis.  Her condition improved and she managed to come off the ECMO circuit and be successfully extubated.  While her initial echo showed significant RV dysfunction, repeat prior to discharge showed significant improvement.  She continues on apixaban  for anticoagulation.  She had been suffering from recurrent uterine bleeding prompting the initiation of progesterone therapy which likely precipitated her pulmonary embolism.  That said, given she has had 3 miscarriages, I will refer her to hematology for hypercoagulability workup.  I will hold off on repeating TTE now.  I will see her back in 3 months at which point we will repeat the echo and obtain pulmonary function testing.  Depending on the findings from PFTs and TTE, I will consider a VQ scan to rule out CTEPH.  - Ambulatory referral to Hematology / Oncology - Continue apixaban  5 mg twice daily  #Uterine bleeding  Has had significant uterine bleeding for which she underwent uterine artery embolization.  I will refer her to gynecology for further evaluation as she was seen by the gynecology team while inpatient.  - Ambulatory referral to Obstetrics / Gynecology   Return in about 3 months (around 03/04/2024).  I spent 42 minutes caring for this patient today, including preparing to see the patient, obtaining a medical history , reviewing a separately obtained history, performing a medically appropriate examination and/or evaluation, counseling and educating the patient/family/caregiver, ordering medications, tests, or procedures, documenting clinical information in the electronic health record, and independently interpreting results (not separately reported/billed) and communicating results to the patient/family/caregiver  Belva November, MD [facility]  End of visit medications:  No orders of the defined types were placed in this encounter.    Current Outpatient Medications:    apixaban  (ELIQUIS ) 5 MG TABS tablet, Take 1 tablet (5 mg total) by mouth 2 (two) times daily., Disp: 60 tablet, Rfl: 1   DULoxetine (CYMBALTA) 30 MG capsule, Take 30 mg by mouth as needed., Disp: , Rfl:    feeding supplement, GLUCERNA SHAKE, (GLUCERNA SHAKE) LIQD, Take 237 mLs by mouth 2 (two) times daily between meals., Disp: 7110 mL, Rfl: 2   Ferrous Gluconate (KP FERROUS GLUCONATE) 324 (37.5 Fe) MG TABS, Take 1 tablet by mouth daily., Disp: , Rfl:    levothyroxine  (SYNTHROID ) 88 MCG tablet, Take 88 mcg by mouth daily before breakfast., Disp: , Rfl:    megestrol  (MEGACE ) 40 MG tablet, Take 1 tablet (40 mg total) by mouth daily., Disp: 30 tablet, Rfl: 0   methylphenidate (RITALIN) 10 MG tablet, Take 20 mg by mouth 2 (two) times daily., Disp: , Rfl:    Multiple Vitamin (MULTIVITAMIN WITH MINERALS) TABS tablet, Take 1 tablet by mouth daily., Disp: 30 tablet, Rfl: 0   polyethylene glycol (MIRALAX  / GLYCOLAX ) 17 g packet, Take 17 g by mouth daily as needed for moderate constipation., Disp: 14 each, Rfl: 0   SM VITAMIN D3 50 MCG (2000 UT) CAPS, Take 2,000 Units by mouth daily., Disp: , Rfl:    cephALEXin (KEFLEX) 500 MG capsule, CEPHALEXIN 500 MG CAPS (Patient not taking: Reported on 12/03/2023), Disp: , Rfl:    Subjective:   PATIENT ID: Anita Aguirre GENDER: female DOB: [birth year], MRN: [id number]  Chief Complaint  Patient presents with   Consult    SOB with exertion and when bending over. No wheezing. No cough.  HPI  Patient is a 48 year old female who was recently admitted to the hospital with obstructive shock secondary to high risk PE requiring ECMO.  She presents to clinic for posthospital discharge follow-up.  Patient had presented to the hospital on 10/19/2023 with increased shortness of breath and chest discomfort and was initially found to have  intermediate high risk pulmonary embolism with RV strain and signs of poor perfusion.  While she had significant clot burden, it was not central and mechanical thrombectomy was not an option.  She was transferred to [facility] where she was admitted to the cardiac ICU.  There, she had hemodynamic decompensation requiring the initiation of vasopressors and mechanical cardiac support with the VA-ECMO.  She underwent uterine artery embolization secondary to bleeding uterine fibroids followed by catheter directed thrombolysis.  Her condition stabilized and she successfully weaned off ECMO and was subsequently extubated. Repeat TTE prior to discharge showed normalization in RV function.  Patient was discharged home on 11/04/2023 oral apixaban  to follow-up with primary care, vascular surgery, heme-onc, pulmonology, and gynecology.  She is felt better but continues to have exertional dyspnea.  She does not have any chest pain or chest tightness.  She is short of breath with exertion but not at rest.  She overall feels that she is improving.  She has not had any further uterine bleeding.  Patient works from home for [employer].  She works an Paramedic job.  She does not have any occupational exposures.  She denies any history of smoking.  She is gravida 3, aborta 3.  No personal history of recurrent thromboembolism.   Ancillary information including prior medications, full medical/surgical/family/social histories, and PFTs (when available) are listed below and have been reviewed.   Review of Systems  Constitutional:  Negative for chills, fever and weight loss.  Respiratory:  Positive for shortness of breath. Negative for cough, hemoptysis, sputum production and wheezing.   Cardiovascular:  Negative for chest pain and claudication.     Objective:   Vitals:   12/03/23 1021  BP: 130/84  Pulse: 90  Temp: (!) 97.1 F (36.2 C)  SpO2: 98%  Weight: 221 lb 6.4 oz (100.4 kg)  Height: 5' 7 (1.702 m)    98% on RA  BMI Readings from Last 3 Encounters:  12/03/23 34.68 kg/m  11/22/23 34.58 kg/m  11/04/23 34.90 kg/m   Wt Readings from Last 3 Encounters:  12/03/23 221 lb 6.4 oz (100.4 kg)  11/22/23 220 lb 12.8 oz (100.2 kg)  11/04/23 222 lb 12.8 oz (101.1 kg)    Physical Exam Constitutional:      Appearance: Normal appearance.  Cardiovascular:     Rate and Rhythm: Normal rate and regular rhythm.     Pulses: Normal pulses.     Heart sounds: Normal heart sounds.  Pulmonary:     Effort: Pulmonary effort is normal.     Breath sounds: Normal breath sounds. No wheezing or rales.  Neurological:     General: No focal deficit present.     Mental Status: She is alert and oriented to person, place, and time. Mental status is at baseline.       Ancillary Information    Past Medical History:  Diagnosis Date   ADD (attention deficit disorder)    Arthritis 04/24/2008   Diffuse cystic mastopathy 04/24/2010   LEFT    Hypertension    Hypothyroidism    Mastitis      Family History  Problem Relation Age of Onset  Hypertension Mother    Throat cancer Paternal Aunt 55   Diabetes Paternal Grandmother      Past Surgical History:  Procedure Laterality Date   ARTERIAL LINE INSERTION N/A 10/20/2023   Procedure: ARTERIAL LINE INSERTION;  Surgeon: Gardenia Led, DO;  Location: [facility] INVASIVE CV LAB;  Service: Cardiovascular;  Laterality: N/A;   ARTERIAL LINE INSERTION N/A 10/20/2023   Procedure: ARTERIAL LINE INSERTION;  Surgeon: Wonda Sharper, MD;  Location: [facility] INVASIVE CV LAB;  Service: Cardiovascular;  Laterality: N/A;   BREAST MASS EXCISION Left 2012   COLONOSCOPY  2005   [place] ? MD    ECMO CANNULATION N/A 10/20/2023   Procedure: ECMO CANNULATION;  Surgeon: Gardenia Led, DO;  Location: [facility] INVASIVE CV LAB;  Service: Cardiovascular;  Laterality: N/A;   INCISE AND DRAIN ABCESS  2010   IR ANGIOGRAM PELVIS SELECTIVE OR SUPRASELECTIVE  10/20/2023   IR ANGIOGRAM PULMONARY  BILATERAL SELECTIVE  10/20/2023   IR ANGIOGRAM SELECTIVE EACH ADDITIONAL VESSEL  10/20/2023   IR ANGIOGRAM SELECTIVE EACH ADDITIONAL VESSEL  10/20/2023   IR ANGIOGRAM SELECTIVE EACH ADDITIONAL VESSEL  10/20/2023   IR ANGIOGRAM SELECTIVE EACH ADDITIONAL VESSEL  10/20/2023   IR EMBO TUMOR ORGAN ISCHEMIA INFARCT INC GUIDE ROADMAPPING  10/20/2023   IR INFUSION THROMBOL ARTERIAL INITIAL (MS)  10/20/2023   IR INFUSION THROMBOL ARTERIAL INITIAL (MS)  10/20/2023   IR THROMB F/U EVAL ART/VEN FINAL DAY (MS)  10/21/2023   IR US  GUIDE VASC ACCESS RIGHT  10/20/2023   IR US  GUIDE VASC ACCESS RIGHT  10/20/2023   MASTECTOMY, PARTIAL  2013   RIGHT HEART CATH N/A 10/20/2023   Procedure: RIGHT HEART CATH;  Surgeon: Gardenia Led, DO;  Location: [facility] INVASIVE CV LAB;  Service: Cardiovascular;  Laterality: N/A;    Social History   Socioeconomic History   Marital status: Single    Spouse name: Not on file   Number of children: Not on file   Years of education: Not on file   Highest education level: Not on file  Occupational History   Not on file  Tobacco Use   Smoking status: Former    Current packs/day: 1.00    Average packs/day: 1 pack/day for 10.0 years (10.0 ttl pk-yrs)    Types: Cigarettes   Smokeless tobacco: Never  Vaping Use   Vaping status: Every Day  Substance and Sexual Activity   Alcohol use: No   Drug use: No   Sexual activity: Not Currently    Birth control/protection: I.U.D.  Other Topics Concern   Not on file  Social History Narrative   Not on file   Social Drivers of Health   Financial Resource Strain: Not on file  Food Insecurity: Food Insecurity Present (10/20/2023)   Hunger Vital Sign    Worried About Running Out of Food in the Last Year: Sometimes true    Ran Out of Food in the Last Year: Never true  Transportation Needs: Unmet Transportation Needs (10/20/2023)   PRAPARE - Administrator, Civil Service (Medical): Yes    Lack of Transportation (Non-Medical): No   Physical Activity: Not on file  Stress: Not on file  Social Connections: Unknown (10/20/2023)   Social Connection and Isolation Panel    Frequency of Communication with Friends and Family: More than three times a week    Frequency of Social Gatherings with Friends and Family: Three times a week    Attends Religious Services: Patient declined    Active Member of Clubs or  Organizations: Patient declined    Attends Banker Meetings: Patient declined    Marital Status: Patient declined  Intimate Partner Violence: Not At Risk (10/20/2023)   Humiliation, Afraid, Rape, and Kick questionnaire    Fear of Current or Ex-Partner: No    Emotionally Abused: No    Physically Abused: No    Sexually Abused: No     Allergies  Allergen Reactions   Wellbutrin [Bupropion] Hives, Itching and Other (See Comments)    Skin crawling sensation   Heparin      HIT     CBC    Component Value Date/Time   WBC 9.5 11/09/2023 0513   RBC 3.92 11/09/2023 0513   HGB 10.4 (L) 11/09/2023 0513   HGB 12.9 05/19/2012 0947   HCT 33.8 (L) 11/09/2023 0513   HCT 37.4 05/19/2012 0947   PLT 719 (H) 11/09/2023 0513   PLT 294 05/19/2012 0947   MCV 86.2 11/09/2023 0513   MCV 91 05/19/2012 0947   MCH 26.5 11/09/2023 0513   MCHC 30.8 11/09/2023 0513   RDW 18.8 (H) 11/09/2023 0513   RDW 14.7 (H) 05/19/2012 0947   LYMPHSABS 1.5 11/09/2023 0513   LYMPHSABS 2.8 05/19/2012 0947   MONOABS 0.7 11/09/2023 0513   MONOABS 0.8 05/19/2012 0947   EOSABS 0.3 11/09/2023 0513   EOSABS 0.3 05/19/2012 0947   BASOSABS 0.0 11/09/2023 0513   BASOSABS 0.1 05/19/2012 0947    Pulmonary Functions Testing Results:     No data to display          Outpatient Medications Prior to Visit  Medication Sig Dispense Refill   apixaban  (ELIQUIS ) 5 MG TABS tablet Take 1 tablet (5 mg total) by mouth 2 (two) times daily. 60 tablet 1   DULoxetine (CYMBALTA) 30 MG capsule Take 30 mg by mouth as needed.     feeding supplement,  GLUCERNA SHAKE, (GLUCERNA SHAKE) LIQD Take 237 mLs by mouth 2 (two) times daily between meals. 7110 mL 2   Ferrous Gluconate (KP FERROUS GLUCONATE) 324 (37.5 Fe) MG TABS Take 1 tablet by mouth daily.     levothyroxine  (SYNTHROID ) 88 MCG tablet Take 88 mcg by mouth daily before breakfast.     megestrol  (MEGACE ) 40 MG tablet Take 1 tablet (40 mg total) by mouth daily. 30 tablet 0   methylphenidate (RITALIN) 10 MG tablet Take 20 mg by mouth 2 (two) times daily.     Multiple Vitamin (MULTIVITAMIN WITH MINERALS) TABS tablet Take 1 tablet by mouth daily. 30 tablet 0   polyethylene glycol (MIRALAX  / GLYCOLAX ) 17 g packet Take 17 g by mouth daily as needed for moderate constipation. 14 each 0   SM VITAMIN D3 50 MCG (2000 UT) CAPS Take 2,000 Units by mouth daily.     cephALEXin (KEFLEX) 500 MG capsule CEPHALEXIN 500 MG CAPS (Patient not taking: Reported on 12/03/2023)     No facility-administered medications prior to visit.

## 2023-12-04 ENCOUNTER — Ambulatory Visit: Payer: Self-pay | Admitting: Physician Assistant

## 2023-12-04 VITALS — BP 115/81 | HR 86 | Ht 67.0 in | Wt 223.6 lb

## 2023-12-04 DIAGNOSIS — R339 Retention of urine, unspecified: Secondary | ICD-10-CM

## 2023-12-04 LAB — BLADDER SCAN AMB NON-IMAGING

## 2023-12-04 MED ORDER — SULFAMETHOXAZOLE-TRIMETHOPRIM 800-160 MG PO TABS: 1.0000 | ORAL_TABLET | Freq: Two times a day (BID) | ORAL | 0 refills | Status: AC

## 2023-12-04 NOTE — Progress Notes (Signed)
 Catheter Removal  Patient is present today for a catheter removal.  9ml of water  was drained from the balloon. A 16FR foley cath was removed from the bladder, no complications were noted. Patient tolerated well.  Performed by: N.Jaynia Fendley  Follow up/ Additional notes: Will return this afternoon.

## 2023-12-04 NOTE — Progress Notes (Signed)
 12/04/2023 12:54 PM   Anita Aguirre 1975/08/02 [id number]  CC: Chief Complaint  Patient presents with   Urinary Retention   HPI: Anita Aguirre is a 48 y.o. female with a recent history of bilateral submassive PE with right heart strain requiring ECMO who subsequently developed urinary retention requiring Foley catheter placement in our ED on 11/09/2023 who presents today for outpatient voiding trial.  On review of chart, there was immediate drainage of 1300 mL of urine with Foley placement in the ED.  Catheter removed in the morning, see separate procedure note.  She returned in the afternoon.  She has voided several times.  PVR 302 mL.  She reports chronic urinary hesitancy.  She feels that she is voiding at baseline since Foley removal, though she has noticed malodorous urine.  At the time that she arrived at the ED, she was unable to void at all.  PMH: Past Medical History:  Diagnosis Date   ADD (attention deficit disorder)    Arthritis 04/24/2008   Diffuse cystic mastopathy 04/24/2010   LEFT    Hypertension    Hypothyroidism    Mastitis     Surgical History: Past Surgical History:  Procedure Laterality Date   ARTERIAL LINE INSERTION N/A 10/20/2023   Procedure: ARTERIAL LINE INSERTION;  Surgeon: Gardenia Led, DO;  Location: [facility] INVASIVE CV LAB;  Service: Cardiovascular;  Laterality: N/A;   ARTERIAL LINE INSERTION N/A 10/20/2023   Procedure: ARTERIAL LINE INSERTION;  Surgeon: Wonda Sharper, MD;  Location: [facility] INVASIVE CV LAB;  Service: Cardiovascular;  Laterality: N/A;   BREAST MASS EXCISION Left 2012   COLONOSCOPY  2005   [place] ? MD    ECMO CANNULATION N/A 10/20/2023   Procedure: ECMO CANNULATION;  Surgeon: Gardenia Led, DO;  Location: [facility] INVASIVE CV LAB;  Service: Cardiovascular;  Laterality: N/A;   INCISE AND DRAIN ABCESS  2010   IR ANGIOGRAM PELVIS SELECTIVE OR SUPRASELECTIVE  10/20/2023   IR ANGIOGRAM PULMONARY BILATERAL SELECTIVE  10/20/2023   IR  ANGIOGRAM SELECTIVE EACH ADDITIONAL VESSEL  10/20/2023   IR ANGIOGRAM SELECTIVE EACH ADDITIONAL VESSEL  10/20/2023   IR ANGIOGRAM SELECTIVE EACH ADDITIONAL VESSEL  10/20/2023   IR ANGIOGRAM SELECTIVE EACH ADDITIONAL VESSEL  10/20/2023   IR EMBO TUMOR ORGAN ISCHEMIA INFARCT INC GUIDE ROADMAPPING  10/20/2023   IR INFUSION THROMBOL ARTERIAL INITIAL (MS)  10/20/2023   IR INFUSION THROMBOL ARTERIAL INITIAL (MS)  10/20/2023   IR THROMB F/U EVAL ART/VEN FINAL DAY (MS)  10/21/2023   IR US  GUIDE VASC ACCESS RIGHT  10/20/2023   IR US  GUIDE VASC ACCESS RIGHT  10/20/2023   MASTECTOMY, PARTIAL  2013   RIGHT HEART CATH N/A 10/20/2023   Procedure: RIGHT HEART CATH;  Surgeon: Gardenia Led, DO;  Location: [facility] INVASIVE CV LAB;  Service: Cardiovascular;  Laterality: N/A;    Home Medications:  Allergies as of 12/04/2023       Reactions   Wellbutrin [bupropion] Hives, Itching, Other (See Comments)   Skin crawling sensation   Heparin     HIT        Medication List        Accurate as of December 04, 2023 12:54 PM. If you have any questions, ask your nurse or doctor.          apixaban  5 MG Tabs tablet Commonly known as: ELIQUIS  Take 1 tablet (5 mg total) by mouth 2 (two) times daily.   cephALEXin 500 MG capsule Commonly known as: KEFLEX CEPHALEXIN 500 MG CAPS  DULoxetine 30 MG capsule Commonly known as: CYMBALTA Take 30 mg by mouth as needed.   feeding supplement (GLUCERNA SHAKE) Liqd Take 237 mLs by mouth 2 (two) times daily between meals.   KP Ferrous Gluconate 324 (37.5 Fe) MG Tabs Generic drug: Ferrous Gluconate Take 1 tablet by mouth daily.   levothyroxine  88 MCG tablet Commonly known as: SYNTHROID  Take 88 mcg by mouth daily before breakfast.   megestrol  40 MG tablet Commonly known as: MEGACE  Take 1 tablet (40 mg total) by mouth daily.   methylphenidate 10 MG tablet Commonly known as: RITALIN Take 20 mg by mouth 2 (two) times daily.   multivitamin with minerals Tabs  tablet Take 1 tablet by mouth daily.   polyethylene glycol 17 g packet Commonly known as: MIRALAX  / GLYCOLAX  Take 17 g by mouth daily as needed for moderate constipation.   SM Vitamin D3 50 MCG (2000 UT) Caps Generic drug: Cholecalciferol Take 2,000 Units by mouth daily.        Allergies:  Allergies  Allergen Reactions   Wellbutrin [Bupropion] Hives, Itching and Other (See Comments)    Skin crawling sensation   Heparin      HIT    Family History: Family History  Problem Relation Age of Onset   Hypertension Mother    Throat cancer Paternal Aunt 41   Diabetes Paternal Grandmother     Social History:   reports that she has quit smoking. Her smoking use included cigarettes. She has a 10 pack-year smoking history. She has never used smokeless tobacco. She reports that she does not drink alcohol and does not use drugs.  Physical Exam: BP 115/81 (BP Location: Right Arm, Patient Position: Sitting, Cuff Size: Large)   Pulse 86   Ht 5' 7 (1.702 m)   Wt 223 lb 9.6 oz (101.4 kg)   BMI 35.02 kg/m   Constitutional:  Alert and oriented, no acute distress, nontoxic appearing HEENT: [place], AT Cardiovascular: No clubbing, cyanosis, or edema Respiratory: Normal respiratory effort, no increased work of breathing Skin: No rashes, bruises or suspicious lesions Neurologic: Grossly intact, no focal deficits, moving all 4 extremities Psychiatric: Normal mood and affect  Laboratory Data: Results for orders placed or performed in visit on 12/04/23  BLADDER SCAN AMB NON-IMAGING   Collection Time: 12/04/23  4:01 PM  Result Value Ref Range   Scan Result    Assessment & Plan:   1. Urinary retention (Primary) Voiding trial equivocal today, though sounds like she is voiding at baseline.  Question chronic incomplete bladder emptying.  Given incomplete emptying and recent Foley, will start empiric Bactrim  DS twice daily x 5 days to sterilize the urine.  I will see her back next month for  repeat PVR and plan for pelvic exam at that time to evaluate her for possible cystocele, which could be contributing to incomplete emptying. - BLADDER SCAN AMB NON-IMAGING - sulfamethoxazole -trimethoprim  (BACTRIM  DS) 800-160 MG tablet; Take 1 tablet by mouth 2 (two) times daily for 5 days.  Dispense: 10 tablet; Refill: 0   Return in about 4 weeks (around 01/01/2024) for Repeat PVR and pelvic exam.  Lucie Hones, PA-C  [facility] [address] [place], [place] [address] [phone]

## 2023-12-26 ENCOUNTER — Inpatient Hospital Stay: Payer: MEDICAID

## 2023-12-26 ENCOUNTER — Inpatient Hospital Stay: Payer: MEDICAID | Attending: Oncology | Admitting: Oncology

## 2023-12-26 ENCOUNTER — Encounter: Payer: Self-pay | Admitting: Oncology

## 2023-12-26 VITALS — BP 128/88 | HR 90 | Temp 98.9°F | Resp 18 | Ht 67.0 in | Wt 224.5 lb

## 2023-12-26 DIAGNOSIS — Z86711 Personal history of pulmonary embolism: Secondary | ICD-10-CM | POA: Insufficient documentation

## 2023-12-26 DIAGNOSIS — F17290 Nicotine dependence, other tobacco product, uncomplicated: Secondary | ICD-10-CM | POA: Insufficient documentation

## 2023-12-26 DIAGNOSIS — I2699 Other pulmonary embolism without acute cor pulmonale: Secondary | ICD-10-CM

## 2023-12-26 DIAGNOSIS — Z7901 Long term (current) use of anticoagulants: Secondary | ICD-10-CM | POA: Insufficient documentation

## 2023-12-26 LAB — ANTITHROMBIN III: AntiThromb III Func: 101 % (ref 75–120)

## 2023-12-26 NOTE — Progress Notes (Signed)
 Fatigue, body aches, some loss of appetite.

## 2023-12-26 NOTE — Progress Notes (Signed)
 [facility]  [phone] [phone]  ID: [name] OB: 02-12-1976  MR#: [id number]  RDW#:[id number]  Patient Care Team: Pcp, No as PCP - General  CHIEF COMPLAINT: Submassive bilateral pulmonary embolism.  INTERVAL HISTORY: Patient is a 48 year old female who recently had an extended hospital stay which included time in the ICU for submassive bilateral pulmonary embolism.  Patient continues to have residual weakness and fatigue, but otherwise feels well.  She is tolerating Eliquis  without significant side effects.  She has no neurologic complaints.  She denies any recent fevers or illnesses.  She has good appetite and denies weight loss.  She has no chest pain, shortness of breath, cough, or hemoptysis.  She denies any nausea, vomiting, constipation, or diarrhea.  She has no urinary complaints.  Patient offers no further specific complaints today.  REVIEW OF SYSTEMS:   Review of Systems  Constitutional:  Positive for malaise/fatigue. Negative for fever and weight loss.  Respiratory: Negative.  Negative for cough, hemoptysis and shortness of breath.   Cardiovascular: Negative.  Negative for chest pain and leg swelling.  Gastrointestinal: Negative.  Negative for abdominal pain.  Genitourinary: Negative.  Negative for dysuria.  Musculoskeletal: Negative.  Negative for back pain.  Skin: Negative.  Negative for rash.  Neurological: Negative.  Negative for dizziness, focal weakness, weakness and headaches.  Psychiatric/Behavioral: Negative.  The patient is not nervous/anxious.     As per HPI. Otherwise, a complete review of systems is negative.  PAST MEDICAL HISTORY: Past Medical History:  Diagnosis Date   ADD (attention deficit disorder)    Arthritis 04/24/2008   Diffuse cystic mastopathy 04/24/2010   LEFT    Hypertension    Hypothyroidism    Mastitis     PAST SURGICAL HISTORY: Past Surgical History:  Procedure Laterality Date   ARTERIAL  LINE INSERTION N/A 10/20/2023   Procedure: ARTERIAL LINE INSERTION;  Surgeon: Gardenia Led, DO;  [facility] INVASIVE CV LAB;  Service: Cardiovascular;  Laterality: N/A;   ARTERIAL LINE INSERTION N/A 10/20/2023   Procedure: ARTERIAL LINE INSERTION;  Surgeon: Wonda Sharper, MD;  Location: [facility] INVASIVE CV LAB;  Service: Cardiovascular;  Laterality: N/A;   BREAST MASS EXCISION Left 2012   COLONOSCOPY  2005   [place] ? MD    ECMO CANNULATION N/A 10/20/2023   Procedure: ECMO CANNULATION;  Surgeon: Gardenia Led, DO;  Location: [facility] INVASIVE CV LAB;  Service: Cardiovascular;  Laterality: N/A;   INCISE AND DRAIN ABCESS  2010   IR ANGIOGRAM PELVIS SELECTIVE OR SUPRASELECTIVE  10/20/2023   IR ANGIOGRAM PULMONARY BILATERAL SELECTIVE  10/20/2023   IR ANGIOGRAM SELECTIVE EACH ADDITIONAL VESSEL  10/20/2023   IR ANGIOGRAM SELECTIVE EACH ADDITIONAL VESSEL  10/20/2023   IR ANGIOGRAM SELECTIVE EACH ADDITIONAL VESSEL  10/20/2023   IR ANGIOGRAM SELECTIVE EACH ADDITIONAL VESSEL  10/20/2023   IR EMBO TUMOR ORGAN ISCHEMIA INFARCT INC GUIDE ROADMAPPING  10/20/2023   IR INFUSION THROMBOL ARTERIAL INITIAL (MS)  10/20/2023   IR INFUSION THROMBOL ARTERIAL INITIAL (MS)  10/20/2023   IR THROMB F/U EVAL ART/VEN FINAL DAY (MS)  10/21/2023   IR US  GUIDE VASC ACCESS RIGHT  10/20/2023   IR US  GUIDE VASC ACCESS RIGHT  10/20/2023   MASTECTOMY, PARTIAL  2013   RIGHT HEART CATH N/A 10/20/2023   Procedure: RIGHT HEART CATH;  Surgeon: Gardenia Led, DO;  Location: [facility] INVASIVE CV LAB;  Service: Cardiovascular;  Laterality: N/A;    FAMILY HISTORY: Family History  Problem Relation Age of Onset  Hypertension Mother    Throat cancer Paternal Aunt 8   Diabetes Paternal Grandmother     ADVANCED DIRECTIVES (Y/N):  N  HEALTH MAINTENANCE: Social History   Tobacco Use   Smoking status: Former    Current packs/day: 1.00    Average packs/day: 1 pack/day for 10.0 years (10.0 ttl pk-yrs)    Types: Cigarettes   Smokeless  tobacco: Never  Vaping Use   Vaping status: Every Day  Substance Use Topics   Alcohol use: No   Drug use: No     Colonoscopy:  PAP:  Bone density:  Lipid panel:  Allergies  Allergen Reactions   Wellbutrin [Bupropion] Hives, Itching and Other (See Comments)    Skin crawling sensation   Heparin      HIT    Current Outpatient Medications  Medication Sig Dispense Refill   apixaban  (ELIQUIS ) 5 MG TABS tablet Take 1 tablet (5 mg total) by mouth 2 (two) times daily. 60 tablet 1   cephALEXin (KEFLEX) 500 MG capsule CEPHALEXIN 500 MG CAPS     DULoxetine (CYMBALTA) 30 MG capsule Take 30 mg by mouth as needed.     feeding supplement, GLUCERNA SHAKE, (GLUCERNA SHAKE) LIQD Take 237 mLs by mouth 2 (two) times daily between meals. 7110 mL 2   Ferrous Gluconate (KP FERROUS GLUCONATE) 324 (37.5 Fe) MG TABS Take 1 tablet by mouth daily.     levothyroxine  (SYNTHROID ) 88 MCG tablet Take 88 mcg by mouth daily before breakfast.     megestrol  (MEGACE ) 40 MG tablet Take 1 tablet (40 mg total) by mouth daily. 30 tablet 0   methylphenidate (RITALIN) 10 MG tablet Take 20 mg by mouth 2 (two) times daily.     Multiple Vitamin (MULTIVITAMIN WITH MINERALS) TABS tablet Take 1 tablet by mouth daily. 30 tablet 0   polyethylene glycol (MIRALAX  / GLYCOLAX ) 17 g packet Take 17 g by mouth daily as needed for moderate constipation. 14 each 0   SM VITAMIN D3 50 MCG (2000 UT) CAPS Take 2,000 Units by mouth daily.     No current facility-administered medications for this visit.    OBJECTIVE: Vitals:   12/26/23 1344  BP: 128/88  Pulse: 90  Resp: 18  Temp: 98.9 F (37.2 C)  SpO2: 97%     Body mass index is 35.16 kg/m.    ECOG FS:0 - Asymptomatic  General: Well-developed, well-nourished, no acute distress. Eyes: Pink conjunctiva, anicteric sclera. HEENT: Normocephalic, moist mucous membranes. Lungs: No audible wheezing or coughing. Heart: Regular rate and rhythm. Abdomen: Soft, nontender, no obvious  distention. Musculoskeletal: No edema, cyanosis, or clubbing. Neuro: Alert, answering all questions appropriately. Cranial nerves grossly intact. Skin: No rashes or petechiae noted. Psych: Normal affect. Lymphatics: No cervical, calvicular, axillary or inguinal LAD.   LAB RESULTS:  Lab Results  Component Value Date   NA 137 11/09/2023   K 3.9 11/09/2023   CL 107 11/09/2023   CO2 19 (L) 11/09/2023   GLUCOSE 101 (H) 11/09/2023   BUN 12 11/09/2023   CREATININE 0.85 11/09/2023   CALCIUM  9.6 11/09/2023   PROT 5.1 (L) 10/20/2023   ALBUMIN  2.8 (L) 10/20/2023   AST 18 10/20/2023   ALT 9 10/20/2023   ALKPHOS 14 (L) 10/20/2023   BILITOT 1.2 10/20/2023   GFRNONAA >60 11/09/2023   GFRAA >60 08/07/2018    Lab Results  Component Value Date   WBC 9.5 11/09/2023   NEUTROABS 7.0 11/09/2023   HGB 10.4 (L) 11/09/2023   HCT 33.8 (L)  11/09/2023   MCV 86.2 11/09/2023   PLT 719 (H) 11/09/2023     STUDIES: No results found.  ASSESSMENT: Submassive bilateral pulmonary embolism.  PLAN:    Submassive bilateral pulmonary embolism: Possibly secondary to birth control pills.  Patient was initially admitted to the hospital and July 2025 and required ECMO and catheter directed thrombolysis.  She remained on heparin  drip throughout the duration of her hospitalization and was transition to Eliquis  upon discharge.  Given the nature and the size of patient's embolism, patient is hesitant to come off Eliquis .  After discussion of the risks and benefit of lifelong anticoagulation, have agreed the patient will remain on Eliquis  indefinitely.  Patient does not have any children, but we will do a full hypercoagulable workup today for completeness.  Patient will have video-assisted telemedicine visit in 1 month to discuss the results at which time she likely can be discharged from clinic and her medication can be prescribed by primary care.  I spent a total of 60 minutes reviewing chart data, face-to-face  evaluation with the patient, counseling and coordination of care as detailed above.   Patient expressed understanding and was in agreement with this plan. She also understands that She can call clinic at any time with any questions, concerns, or complaints.    Evalene JINNY Reusing, MD   12/26/2023 4:32 PM

## 2023-12-27 LAB — PROTEIN S ACTIVITY: Protein S Activity: 108 % (ref 63–140)

## 2023-12-27 LAB — DRVVT MIX: dRVVT Mix: 45.6 s — ABNORMAL HIGH (ref 0.0–40.4)

## 2023-12-27 LAB — LUPUS ANTICOAGULANT PANEL
DRVVT: 57.3 s — ABNORMAL HIGH (ref 0.0–47.0)
PTT Lupus Anticoagulant: 37.1 s (ref 0.0–43.5)

## 2023-12-27 LAB — BETA-2-GLYCOPROTEIN I ABS, IGG/M/A
Beta-2 Glyco I IgG: <9 GPI IgG units (ref 0–20)
Beta-2-Glycoprotein I IgA: <9 GPI IgA units (ref 0–25)
Beta-2-Glycoprotein I IgM: <9 GPI IgM units (ref 0–32)

## 2023-12-27 LAB — PROTEIN S, TOTAL: Protein S Ag, Total: 94 % (ref 60–150)

## 2023-12-27 LAB — PROTEIN C, TOTAL: Protein C, Total: 110 % (ref 60–150)

## 2023-12-27 LAB — PROTEIN C ACTIVITY: Protein C Activity: 119 % (ref 73–180)

## 2023-12-27 LAB — DRVVT CONFIRM: dRVVT Confirm: 1 ratio (ref 0.8–1.2)

## 2023-12-28 LAB — CARDIOLIPIN ANTIBODIES, IGG, IGM, IGA
Anticardiolipin IgA: <9 U/mL (ref 0–11)
Anticardiolipin IgG: <9 GPL U/mL (ref 0–14)
Anticardiolipin IgM: <9 [MPL'U]/mL (ref 0–12)

## 2023-12-31 LAB — FACTOR 5 LEIDEN

## 2023-12-31 LAB — PROTHROMBIN GENE MUTATION

## 2024-01-02 ENCOUNTER — Telehealth: Payer: Self-pay

## 2024-01-02 ENCOUNTER — Inpatient Hospital Stay: Payer: MEDICAID | Admitting: Licensed Clinical Social Worker

## 2024-01-02 NOTE — Progress Notes (Signed)
 [facility] Clinical Social Work  Clinical Social Work was referred by medical provider for financial concerns.  Clinical Social Worker contacted patient by phone to offer support and assess for needs.     Interventions: Discussed patient's concerns regarding medical bills.  Provided her with the phone number for DSS so that she can contact them regarding her Medicaid benefits.  She was not sure if she qualified.      Follow Up Plan:  Patient will contact CSW with any support or resource needs    Macario CHRISTELLA Au, LCSW  Clinical Social Worker [facility]

## 2024-01-02 NOTE — Telephone Encounter (Signed)
 Clinical Social Work was referred by medical provider for assessment of psychosocial needs.  CSW attempted to contact patient by phone.  Left voicemail with contact information and request for return call.

## 2024-01-04 ENCOUNTER — Ambulatory Visit: Payer: Self-pay | Admitting: Physician Assistant

## 2024-01-10 ENCOUNTER — Ambulatory Visit: Payer: Self-pay | Admitting: Nurse Practitioner

## 2024-01-18 ENCOUNTER — Ambulatory Visit (INDEPENDENT_AMBULATORY_CARE_PROVIDER_SITE_OTHER): Payer: Self-pay | Admitting: Advanced Practice Midwife

## 2024-01-18 ENCOUNTER — Encounter: Payer: Self-pay | Admitting: Advanced Practice Midwife

## 2024-01-18 VITALS — BP 138/94 | HR 81 | Ht 67.0 in | Wt 223.0 lb

## 2024-01-18 DIAGNOSIS — Z9189 Other specified personal risk factors, not elsewhere classified: Secondary | ICD-10-CM

## 2024-01-18 DIAGNOSIS — N939 Abnormal uterine and vaginal bleeding, unspecified: Secondary | ICD-10-CM

## 2024-01-18 NOTE — Progress Notes (Signed)
 Patient ID: Anita Aguirre, female   DOB: Mar 20, 1976, 48 y.o.   MRN: [id number]  Reason for Consult: Referral (Vaginal Spotting on/off since July, was on life support for about one week.)   Referred by: Pulmonology provider  Subjective:  Date of Service: 01/18/2024  HPI:  Anita Aguirre is a 48 y.o. female being seen to discuss the safety of continuing on progesterone in the setting of other medications being taken- specifically Eliquis . She was hospitalized in June of this year with acute bilateral pulmonary embolism. She has a history of menorrhagia and anemia. Has used IUD and Depo Provera  for birth control in the past. During her hospitalization she underwent uterine artery embolization. She was on Megace  to help control bleeding while inpatient. Recommendation that she switch to Aygestin on 10/25/23. Has had light spotting since July on her current medications. Per labs done at [facility], she has been postmenopausal. I do not have access to those labs. She has history of fibroid uterus. Normal PAP 2023.  She also mentions having previously been on hydrochlorothiazide prior to her hospitalization and it was not restarted. She is wondering if she should start taking that medication again. Still having elevated blood pressure.   In a review of her medications and possible interactions/contraindications, there is some risk that Aygestin (progesterone) can increase the effects of Eliquis - increasing blood levels and possibly leading to increased bleeding. There is also some risk of thrombosis with Aygestin.   My recommendation, given the possible risks associated with the combination of Eliquis  and Aygestin, and in light of the fact that she had uterine artery embolization, is that she discontinue Aygestin. She should follow up with MD should her heavy bleeding reoccur to discuss the need for hysterectomy. Also recommend that she see her PCP to discuss restarting  antihypertensive.   Past Medical History:  Diagnosis Date   ADD (attention deficit disorder)    Arthritis 04/24/2008   Diffuse cystic mastopathy 04/24/2010   LEFT    Hypertension    Hypothyroidism    Mastitis    Family History  Problem Relation Age of Onset   Hypertension Mother    Throat cancer Paternal Aunt 30   Diabetes Paternal Grandmother    Past Surgical History:  Procedure Laterality Date   ARTERIAL LINE INSERTION N/A 10/20/2023   Procedure: ARTERIAL LINE INSERTION;  Surgeon: Gardenia Led, DO;  Location: [facility] INVASIVE CV LAB;  Service: Cardiovascular;  Laterality: N/A;   ARTERIAL LINE INSERTION N/A 10/20/2023   Procedure: ARTERIAL LINE INSERTION;  Surgeon: Wonda Sharper, MD;  [facility];  Service: Cardiovascular;  Laterality: N/A;   BREAST MASS EXCISION Left 2012   COLONOSCOPY  2005   [place] ? MD    ECMO CANNULATION N/A 10/20/2023   Procedure: ECMO CANNULATION;  Surgeon: Gardenia Led, DO;  Location: [facility] INVASIVE CV LAB;  Service: Cardiovascular;  Laterality: N/A;   INCISE AND DRAIN ABCESS  2010   IR ANGIOGRAM PELVIS SELECTIVE OR SUPRASELECTIVE  10/20/2023   IR ANGIOGRAM PULMONARY BILATERAL SELECTIVE  10/20/2023   IR ANGIOGRAM SELECTIVE EACH ADDITIONAL VESSEL  10/20/2023   IR ANGIOGRAM SELECTIVE EACH ADDITIONAL VESSEL  10/20/2023   IR ANGIOGRAM SELECTIVE EACH ADDITIONAL VESSEL  10/20/2023   IR ANGIOGRAM SELECTIVE EACH ADDITIONAL VESSEL  10/20/2023   IR EMBO TUMOR ORGAN ISCHEMIA INFARCT INC GUIDE ROADMAPPING  10/20/2023   IR INFUSION THROMBOL ARTERIAL INITIAL (MS)  10/20/2023   IR INFUSION THROMBOL ARTERIAL INITIAL (MS)  10/20/2023  IR THROMB F/U EVAL ART/VEN FINAL DAY (MS)  10/21/2023   IR US  GUIDE VASC ACCESS RIGHT  10/20/2023   IR US  GUIDE VASC ACCESS RIGHT  10/20/2023   MASTECTOMY, PARTIAL  2013   RIGHT HEART CATH N/A 10/20/2023   Procedure: RIGHT HEART CATH;  Surgeon: Gardenia Led, DO;  Location: [facility] INVAS[facility] CV LAB;  Service: Cardiovascular;   Laterality: N/A;    Short Social History:  Social History   Tobacco Use   Smoking status: Former    Current packs/day: 1.00    Average packs/day: 1 pack/day for 10.0 years (10.0 ttl pk-yrs)    Types: Cigarettes   Smokeless tobacco: Never  Substance Use Topics   Alcohol use: No    Allergies  Allergen Reactions   Wellbutrin [Bupropion] Hives, Itching and Other (See Comments)    Skin crawling sensation   Heparin      HIT    Current Outpatient Medications  Medication Sig Dispense Refill   apixaban  (ELIQUIS ) 5 MG TABS tablet Take 1 tablet (5 mg total) by mouth 2 (two) times daily. 60 tablet 1   DULoxetine (CYMBALTA) 30 MG capsule Take 30 mg by mouth as needed.     feeding supplement, GLUCERNA SHAKE, (GLUCERNA SHAKE) LIQD Take 237 mLs by mouth 2 (two) times daily between meals. 7110 mL 2   Ferrous Gluconate (KP FERROUS GLUCONATE) 324 (37.5 Fe) MG TABS Take 1 tablet by mouth daily.     levothyroxine  (SYNTHROID ) 88 MCG tablet Take 88 mcg by mouth daily before breakfast.     megestrol  (MEGACE ) 40 MG tablet Take 1 tablet (40 mg total) by mouth daily. 30 tablet 0   methylphenidate (RITALIN) 10 MG tablet Take 20 mg by mouth 2 (two) times daily.     Multiple Vitamin (MULTIVITAMIN WITH MINERALS) TABS tablet Take 1 tablet by mouth daily. 30 tablet 0   norethindrone (AYGESTIN) 5 MG tablet Take 5 mg by mouth daily.     polyethylene glycol (MIRALAX  / GLYCOLAX ) 17 g packet Take 17 g by mouth daily as needed for moderate constipation. 14 each 0   SM VITAMIN D3 50 MCG (2000 UT) CAPS Take 2,000 Units by mouth daily.     No current facility-administered medications for this visit.    Review of Systems  Constitutional:  Negative for chills and fever.  HENT:  Negative for congestion, ear discharge, ear pain, hearing loss, sinus pain and sore throat.   Eyes:  Negative for blurred vision and double vision.  Respiratory:  Negative for cough, shortness of breath and wheezing.   Cardiovascular:   Negative for chest pain, palpitations and leg swelling.  Gastrointestinal:  Negative for abdominal pain, blood in stool, constipation, diarrhea, heartburn, melena, nausea and vomiting.  Genitourinary:  Negative for dysuria, flank pain, frequency, hematuria and urgency.  Musculoskeletal:  Negative for back pain, joint pain and myalgias.  Skin:  Negative for itching and rash.  Neurological:  Negative for dizziness, tingling, tremors, sensory change, speech change, focal weakness, seizures, loss of consciousness, weakness and headaches.  Endo/Heme/Allergies:  Negative for environmental allergies. Does not bruise/bleed easily.       Positive for vaginal spotting  Psychiatric/Behavioral:  Negative for depression, hallucinations, memory loss, substance abuse and suicidal ideas. The patient is not nervous/anxious and does not have insomnia.        Objective:  Objective   Vitals:   01/18/24 1357 01/18/24 1500  BP: (!) 146/97 (!) 138/94  Pulse: 87 81  Weight: 223 lb (101.2 kg)  Height: 5' 7 (1.702 m)    Body mass index is 34.93 kg/m. Constitutional: obese female in no acute distress.  HEENT: normal Skin: Warm and dry.  Cardiovascular: Regular rate and rhythm.   Respiratory:  Normal respiratory effort Psych: Alert and Oriented x3. No memory deficits. Normal mood and affect.    Assessment/Plan:     48 y.o. G3 P0030 female being seen for medication consult  Recommend D/C Aygestin Follow up with Gyn MD if heavy bleeding occurs to discuss hysterectomy Follow up with PCP regarding BP medication Continue care as planned with Pulmonology and other members of care team   Slater Rains, CNM [facility] 01/21/2024 3:11 PM

## 2024-01-18 NOTE — Patient Instructions (Signed)
 Abnormal Uterine Bleeding    Abnormal uterine bleeding means bleeding more than normal from your womb (uterus). It can include:  Bleeding after sex.  Bleeding between monthly (menstrual) periods.  Bleeding that is heavier than normal.  Monthly periods that last longer than normal.  Bleeding after you have stopped having your monthly period (menopause).  You should see a doctor for any kind of bleeding that is not normal. Treatment depends on the cause of your bleeding and how much you bleed.  Follow these instructions at home:  Medicines  Take over-the-counter and prescription medicines only as told by your doctor.  Ask your doctor about:  Taking medicines such as aspirin and ibuprofen. Do not take these medicines unless your doctor tells you to take them.  Taking over-the-counter medicines, vitamins, herbs, and supplements.  You may be given iron pills. Take them as told by your doctor.  Managing constipation  If you take iron pills, you may need to take these actions to prevent or treat trouble pooping (constipation):  Drink enough fluid to keep your pee (urine) pale yellow.  Take over-the-counter or prescription medicines.  Eat foods that are high in fiber. These include beans, whole grains, and fresh fruits and vegetables.  Limit foods that are high in fat and sugar. These include fried or sweet foods.  Activity  Change your activity to decrease bleeding if you need to change your sanitary pad more than one time every 2 hours:  Lie in bed with your feet raised (elevated).  Place a cold pack on your lower belly.  Rest as much as you are able until the bleeding stops or slows down.  General instructions  Do not use tampons, douche, or have sex until your doctor says these things are okay.  Change your pads often.  Get regular exams. These include:  Pelvic exams.  Screenings for cancer of the cervix.  It is up to you to get the results of any tests that are done. Ask how to get your results when they are  ready.  Watch for any changes in your bleeding. For 2 months, write down:  When your monthly period starts.  When your monthly period ends.  When you get any abnormal bleeding from your vagina.  What problems you notice.  Keep all follow-up visits.  Contact a doctor if:  The bleeding lasts more than one week.  You feel dizzy at times.  You feel like you may vomit (nausea).  You vomit.  You feel light-headed or weak.  Your symptoms get worse.  Get help right away if:  You faint.  You have to change pads every hour.  You have pain in your belly.  You have a fever or chills.  You get sweaty or weak.  You pass large blood clots from your vagina.  These symptoms may be an emergency. Get help right away. Call your local emergency services (911 in the [place]  Do not wait to see if the symptoms will go away.  Do not drive yourself to the hospital.  Summary  Abnormal uterine bleeding means bleeding more than normal from your womb (uterus).  Any kind of bleeding that is not normal should be checked by a doctor.  Treatment depends on the cause of your bleeding and how much you bleed.  Get help right away if you faint, you have to change pads every hour, or you pass large blood clots from your vagina.  This information is not intended to replace  advice given to you by your health care provider. Make sure you discuss any questions you have with your health care provider.  Document Revised: 08/10/2020 Document Reviewed: 08/10/2020  Elsevier Patient Education  2024 [employer].

## 2024-01-21 ENCOUNTER — Encounter: Payer: Self-pay | Admitting: Advanced Practice Midwife

## 2024-01-21 DIAGNOSIS — F419 Anxiety disorder, unspecified: Secondary | ICD-10-CM | POA: Insufficient documentation

## 2024-01-29 ENCOUNTER — Inpatient Hospital Stay: Payer: Self-pay | Attending: Oncology | Admitting: Oncology

## 2024-01-29 ENCOUNTER — Encounter: Payer: Self-pay | Admitting: Oncology

## 2024-01-29 DIAGNOSIS — I2699 Other pulmonary embolism without acute cor pulmonale: Secondary | ICD-10-CM

## 2024-01-29 NOTE — Progress Notes (Unsigned)
 [facility]  [phone] [phone]  ID: Anita Aguirre OB: 03/27/1976  MR#: [id number]  RDW#:[id number]  Patient Care Team: Pcp, No as PCP - General  I connected with Anita Aguirre on 01/30/24 at  3:30 PM EDT by video enabled telemedicine visit and verified that I am speaking with the correct person using two identifiers.   I discussed the limitations, risks, security and privacy concerns of performing an evaluation and management service by telemedicine and the availability of in-person appointments. I also discussed with the patient that there may be a patient responsible charge related to this service. The patient expressed understanding and agreed to proceed.   Other persons participating in the visit and their role in the encounter: Patient, MD.  Patient's location: Home. Provider's location: Clinic.  CHIEF COMPLAINT: Submassive bilateral pulmonary embolism.  INTERVAL HISTORY: Patient agreed to video-assisted telemedicine visit for further evaluation and discussion of her laboratory results.  She continues to have shortness of breath, but states this is improving.  She otherwise feels well.  She is tolerating Eliquis  without significant side effects.  She has no neurologic complaints.  She denies any recent fevers or illnesses.  She has good appetite and denies weight loss.  She has no chest pain, cough, or hemoptysis.  She denies any nausea, vomiting, constipation, or diarrhea.  She has no urinary complaints.  Patient offers no further specific complaints today.  REVIEW OF SYSTEMS:   Review of Systems  Constitutional: Negative.  Negative for fever, malaise/fatigue and weight loss.  Respiratory:  Positive for shortness of breath. Negative for cough and hemoptysis.   Cardiovascular: Negative.  Negative for chest pain and leg swelling.  Gastrointestinal: Negative.  Negative for abdominal pain.  Genitourinary: Negative.  Negative for dysuria.   Musculoskeletal: Negative.  Negative for back pain.  Skin: Negative.  Negative for rash.  Neurological: Negative.  Negative for dizziness, focal weakness, weakness and headaches.  Psychiatric/Behavioral: Negative.  The patient is not nervous/anxious.     As per HPI. Otherwise, a complete review of systems is negative.  PAST MEDICAL HISTORY: Past Medical History:  Diagnosis Date   ADD (attention deficit disorder)    Arthritis 04/24/2008   Diffuse cystic mastopathy 04/24/2010   LEFT    Hypertension    Hypothyroidism    Mastitis     PAST SURGICAL HISTORY: Past Surgical History:  Procedure Laterality Date   ARTERIAL LINE INSERTION N/A 10/20/2023   Procedure: ARTERIAL LINE INSERTION;  Surgeon: Gardenia Led, DO;  Location: [facility] INVASIVE CV LAB;  Service: Cardiovascular;  Laterality: N/A;   ARTERIAL LINE INSERTION N/A 10/20/2023   Procedure: ARTERIAL LINE INSERTION;  Surgeon: Wonda Sharper, MD;  Location: [facility] INVASIVE CV LAB;  Service: Cardiovascular;  Laterality: N/A;   BREAST MASS EXCISION Left 2012   COLONOSCOPY  2005   [place] ? MD    ECMO CANNULATION N/A 10/20/2023   Procedure: ECMO CANNULATION;  Surgeon: Gardenia Led, DO;  Location: [facility] INVASIVE CV LAB;  Service: Cardiovascular;  Laterality: N/A;   INCISE AND DRAIN ABCESS  2010   IR ANGIOGRAM PELVIS SELECTIVE OR SUPRASELECTIVE  10/20/2023   IR ANGIOGRAM PULMONARY BILATERAL SELECTIVE  10/20/2023   IR ANGIOGRAM SELECTIVE EACH ADDITIONAL VESSEL  10/20/2023   IR ANGIOGRAM SELECTIVE EACH ADDITIONAL VESSEL  10/20/2023   IR ANGIOGRAM SELECTIVE EACH ADDITIONAL VESSEL  10/20/2023   IR ANGIOGRAM SELECTIVE EACH ADDITIONAL VESSEL  10/20/2023   IR EMBO TUMOR ORGAN ISCHEMIA INFARCT INC GUIDE ROADMAPPING  10/20/2023  IR INFUSION THROMBOL ARTERIAL INITIAL (MS)  10/20/2023   IR INFUSION THROMBOL ARTERIAL INITIAL (MS)  10/20/2023   IR THROMB F/U EVAL ART/VEN FINAL DAY (MS)  10/21/2023   IR US  GUIDE VASC ACCESS RIGHT  10/20/2023   IR US  GUIDE  VASC ACCESS RIGHT  10/20/2023   MASTECTOMY, PARTIAL  2013   RIGHT HEART CATH N/A 10/20/2023   Procedure: RIGHT HEART CATH;  Surgeon: Gardenia Led, DO;  Location: [facility] INVASIVE CV LAB;  Service: Cardiovascular;  Laterality: N/A;    FAMILY HISTORY: Family History  Problem Relation Age of Onset   Hypertension Mother    Throat cancer Paternal Aunt 27   Diabetes Paternal Grandmother     ADVANCED DIRECTIVES (Y/N):  N  HEALTH MAINTENANCE: Social History   Tobacco Use   Smoking status: Former    Current packs/day: 1.00    Average packs/day: 1 pack/day for 10.0 years (10.0 ttl pk-yrs)    Types: Cigarettes   Smokeless tobacco: Never  Vaping Use   Vaping status: Every Day  Substance Use Topics   Alcohol use: No   Drug use: No     Colonoscopy:  PAP:  Bone density:  Lipid panel:  Allergies  Allergen Reactions   Wellbutrin [Bupropion] Hives, Itching and Other (See Comments)    Skin crawling sensation   Heparin      HIT    Current Outpatient Medications  Medication Sig Dispense Refill   amLODipine (NORVASC) 5 MG tablet Take 5 mg by mouth daily.     apixaban  (ELIQUIS ) 5 MG TABS tablet Take 1 tablet (5 mg total) by mouth 2 (two) times daily. 60 tablet 1   DULoxetine (CYMBALTA) 30 MG capsule Take 30 mg by mouth as needed.     feeding supplement, GLUCERNA SHAKE, (GLUCERNA SHAKE) LIQD Take 237 mLs by mouth 2 (two) times daily between meals. 7110 mL 2   Ferrous Gluconate (KP FERROUS GLUCONATE) 324 (37.5 Fe) MG TABS Take 1 tablet by mouth daily.     gabapentin (NEURONTIN) 300 MG capsule GABAPENTIN 300 MG CAPS     levothyroxine  (SYNTHROID ) 88 MCG tablet Take 88 mcg by mouth daily before breakfast.     megestrol  (MEGACE ) 40 MG tablet Take 1 tablet (40 mg total) by mouth daily. 30 tablet 0   methylphenidate (RITALIN) 10 MG tablet Take 20 mg by mouth 2 (two) times daily.     Multiple Vitamin (MULTIVITAMIN WITH MINERALS) TABS tablet Take 1 tablet by mouth daily. 30 tablet 0    norethindrone (AYGESTIN) 5 MG tablet Take 5 mg by mouth daily.     polyethylene glycol (MIRALAX  / GLYCOLAX ) 17 g packet Take 17 g by mouth daily as needed for moderate constipation. 14 each 0   SM VITAMIN D3 50 MCG (2000 UT) CAPS Take 2,000 Units by mouth daily.     No current facility-administered medications for this visit.    OBJECTIVE: There were no vitals filed for this visit.    There is no height or weight on file to calculate BMI.    ECOG FS:0 - Asymptomatic  General: Well-developed, well-nourished, no acute distress. HEENT: Normocephalic. Neuro: Alert, answering all questions appropriately. Cranial nerves grossly intact. Psych: Normal affect.  LAB RESULTS:  Lab Results  Component Value Date   NA 137 11/09/2023   K 3.9 11/09/2023   CL 107 11/09/2023   CO2 19 (L) 11/09/2023   GLUCOSE 101 (H) 11/09/2023   BUN 12 11/09/2023   CREATININE 0.85 11/09/2023   CALCIUM  9.6  11/09/2023   PROT 5.1 (L) 10/20/2023   ALBUMIN  2.8 (L) 10/20/2023   AST 18 10/20/2023   ALT 9 10/20/2023   ALKPHOS 14 (L) 10/20/2023   BILITOT 1.2 10/20/2023   GFRNONAA >60 11/09/2023   GFRAA >60 08/07/2018    Lab Results  Component Value Date   WBC 9.5 11/09/2023   NEUTROABS 7.0 11/09/2023   HGB 10.4 (L) 11/09/2023   HCT 33.8 (L) 11/09/2023   MCV 86.2 11/09/2023   PLT 719 (H) 11/09/2023     STUDIES: No results found.  ASSESSMENT: Submassive bilateral pulmonary embolism.  PLAN:    Submassive bilateral pulmonary embolism: Possibly secondary to birth control pills.  Patient was initially admitted to the hospital and July 2025 and required ECMO and catheter directed thrombolysis.  She remained on heparin  drip throughout the duration of her hospitalization and was transition to Eliquis  upon discharge.  Complete hypercoagulable workup was negative.  We once again discussed the risks and benefit of lifelong anticoagulation, and have agreed the patient will remain on Eliquis  indefinitely.  No further  evaluation is needed.  Patient can receive all her refills of Eliquis  from primary care.  No further follow-up has been scheduled.  Please refer patient back if there are any questions or concerns.  I provided 20 minutes of face-to-face video visit time during this encounter which included chart review, counseling, and coordination of care as documented above.    Patient expressed understanding and was in agreement with this plan. She also understands that She can call clinic at any time with any questions, concerns, or complaints.    Evalene JINNY Reusing, MD   01/30/2024 7:47 AM

## 2024-01-29 NOTE — Progress Notes (Unsigned)
 Patient said that she is still having some chest pain and shortness of breath when she tries to exert her self too much, other than that she is doing ok.

## 2024-03-03 ENCOUNTER — Telehealth: Payer: Self-pay

## 2024-03-04 ENCOUNTER — Encounter: Payer: Self-pay | Admitting: Family Medicine

## 2024-03-18 ENCOUNTER — Encounter: Payer: Self-pay | Admitting: Student in an Organized Health Care Education/Training Program

## 2024-03-18 ENCOUNTER — Ambulatory Visit (INDEPENDENT_AMBULATORY_CARE_PROVIDER_SITE_OTHER): Payer: Self-pay | Admitting: Student in an Organized Health Care Education/Training Program

## 2024-03-18 VITALS — BP 136/92 | HR 99 | Temp 98.7°F | Ht 67.0 in | Wt 233.0 lb

## 2024-03-18 DIAGNOSIS — Z87891 Personal history of nicotine dependence: Secondary | ICD-10-CM

## 2024-03-18 DIAGNOSIS — Z86711 Personal history of pulmonary embolism: Secondary | ICD-10-CM

## 2024-03-18 DIAGNOSIS — I2699 Other pulmonary embolism without acute cor pulmonale: Secondary | ICD-10-CM

## 2024-03-18 DIAGNOSIS — R0602 Shortness of breath: Secondary | ICD-10-CM

## 2024-03-18 DIAGNOSIS — K449 Diaphragmatic hernia without obstruction or gangrene: Secondary | ICD-10-CM

## 2024-03-18 NOTE — Patient Instructions (Signed)
  VISIT SUMMARY: Today, you came in for a follow-up on your anticoagulation therapy due to your history of high-risk pulmonary embolism. We discussed your current symptoms, including shortness of breath and the impact of your hiatal hernia. We also reviewed your current medications and planned further evaluations to better understand your condition.  YOUR PLAN: -HISTORY OF HIGH-RISK PULMONARY EMBOLISM: A pulmonary embolism is a blockage in one of the pulmonary arteries in your lungs, which can be life-threatening. You are currently on Eliquis , a blood thinner, to prevent further clots. You should continue taking Eliquis  5 mg twice daily indefinitely as advised by oncology and hematology specialists.  -SHORTNESS OF BREATH: Your shortness of breath could be due to the after-effects of your previous pulmonary embolism or your hiatal hernia. To better understand the cause, we have ordered an echocardiogram to check your heart function, a pulmonary function test to assess your lung function, and a perfusion scan to look for any remaining clots or lung damage.  -HIATAL HERNIA: A hiatal hernia occurs when part of your stomach pushes up into your chest through an opening in your diaphragm. This can contribute to your shortness of breath, especially when bending over or after eating. We recommend weight loss to help alleviate these symptoms.  INSTRUCTIONS: Please continue taking Eliquis  5 mg twice daily as prescribed. You will need to complete an echocardiogram, a pulmonary function test, and a perfusion scan to further evaluate your heart and lung function. Follow up with us  after these tests are completed to discuss the results and next steps.     Contains text generated by [employer].

## 2024-03-18 NOTE — Progress Notes (Signed)
 Assessment & Plan:   Assessment & Plan  #High-risk pulmonary embolism, on long-term anticoagulation #Shortness of Breath  She was admitted for high risk pulmonary embolism with obstructive shock requiring VA-ECMO and catheter directed thrombolysis.  Her condition improved and she managed to come off the ECMO circuit and be successfully extubated.  While her initial echo showed significant RV dysfunction, repeat prior to discharge showed significant improvement.   She was referred to Hematology and hypercoagulable workup has been negative. She continues on Apixaban  which I recommend continuing indefinitely given the high risk features of her PE.   She does report exertional dyspnea, worse with exertion. Symptoms also worse with bending and after food intake. Differential diagnosis includes CTEPH vs worsening hiatal hernia. Will obtain TTE to assess RV function, PFT's to assess for any obstruction on spirometry, and a perfusion scan to assess for CTEPH.  - Pulmonary Function Test; Future - ECHOCARDIOGRAM COMPLETE; Future - NM Pulmonary Perfusion; Future - DG Chest 2 View; Future - Continue Eliquis  5 mg oral bid indefinitely.  #Hiatal hernia contributing to dyspnea  Small hiatal hernia identified, contributing to dyspnea, especially with bending or postprandial. Further evaluation deferred. - Recommended weight loss to alleviate symptoms. -will re-evaluate on follow up after CTEPH ruled out    Return in about 3 months (around 06/18/2024).  Belva November, MD [facility]  I spent 30 minutes caring for this patient today, including preparing to see the patient, obtaining a medical history , reviewing a separately obtained history, performing a medically appropriate examination and/or evaluation, counseling and educating the patient/family/caregiver, ordering medications, tests, or procedures, documenting clinical information in the electronic health record, and  independently interpreting results (not separately reported/billed) and communicating results to the patient/family/caregiver  End of visit medications:  No orders of the defined types were placed in this encounter.    Current Outpatient Medications:    apixaban  (ELIQUIS ) 5 MG TABS tablet, Take 1 tablet (5 mg total) by mouth 2 (two) times daily., Disp: 60 tablet, Rfl: 1   feeding supplement, GLUCERNA SHAKE, (GLUCERNA SHAKE) LIQD, Take 237 mLs by mouth 2 (two) times daily between meals., Disp: 7110 mL, Rfl: 2   gabapentin (NEURONTIN) 300 MG capsule, GABAPENTIN 300 MG CAPS, Disp: , Rfl:    levothyroxine  (SYNTHROID ) 88 MCG tablet, Take 88 mcg by mouth daily before breakfast., Disp: , Rfl:    losartan (COZAAR) 50 MG tablet, Take 50 mg by mouth daily., Disp: , Rfl:    megestrol  (MEGACE ) 40 MG tablet, Take 1 tablet (40 mg total) by mouth daily., Disp: 30 tablet, Rfl: 0   methylphenidate (RITALIN) 10 MG tablet, Take 20 mg by mouth 2 (two) times daily., Disp: , Rfl:    norethindrone (AYGESTIN) 5 MG tablet, Take 5 mg by mouth daily., Disp: , Rfl:    Subjective:   PATIENT ID: Anita Aguirre, Anita Aguirre  Chief Complaint  Patient presents with   Pulmonary Embolism    SOB. Occasional wheezing. No cough.     HPI  Discussed the use of AI scribe software for clinical note transcription with the patient, who gave verbal consent to proceed.  History of Present Illness Anita Aguirre is a 48 year old female with a history of high risk pulmonary embolism who presents for follow-up. She is accompanied by her mother.  Initial Visit 12/03/2023:  Patient had presented to the hospital on 10/19/2023 with increased shortness of breath and chest discomfort and was  initially found to have intermediate high risk pulmonary embolism with RV strain and signs of poor perfusion.  While she had significant clot burden, it was not central and mechanical thrombectomy was not an option.   She was transferred to [facility] where she was admitted to the cardiac ICU.  There, she had hemodynamic decompensation requiring the initiation of vasopressors and mechanical cardiac support with the VA-ECMO.  She underwent uterine artery embolization secondary to bleeding uterine fibroids followed by catheter directed thrombolysis.  Her condition stabilized and she successfully weaned off ECMO and was subsequently extubated. Repeat TTE prior to discharge showed normalization in RV function.   Patient was discharged home on 11/04/2023 oral apixaban  to follow-up with primary care, vascular surgery, heme-onc, pulmonology, and gynecology.  She is felt better but continues to have exertional dyspnea.  She does not have any chest pain or chest tightness.  She is short of breath with exertion but not at rest.  She overall feels that she is improving.  She has not had any further uterine bleeding.  Return Visit 03/18/2024:  She has a history of high risk pulmonary embolism associated with progesterone use, which required initiation of VA ECMO. She is currently maintained on Eliquis  twice daily. A hypercoagulable workup by oncology and hematology returned negative results.  She experiences shortness of breath, particularly with exertion, bending over, and after consuming large meals. Symptoms worsen at night but improve when she lies on her stomach. She reports wheezing and rales, described as 'rocks rubbing together,' primarily at night. No significant coughing is noted.  A hiatal hernia is noted in her medical chart, which she believes contributes to her symptoms when bending over or after eating.   She mentions a medication prescribed to increase her appetite, intended to stop uterine bleeding, but she is concerned about its effect on her weight, as she does not need an appetite stimulant.   Patient works from home for [employer].  She works an paramedic job.  She does not have any occupational  exposures.  She denies any history of smoking.  She is gravida 3, aborta 3.  No personal history of recurrent thromboembolism.   Ancillary information including prior medications, full medical/surgical/family/social histories, and PFTs (when available) are listed below and have been reviewed.    Review of Systems  Constitutional:  Negative for chills, fever, malaise/fatigue and weight loss.  Respiratory:  Positive for shortness of breath. Negative for cough, hemoptysis, sputum production and wheezing.   Cardiovascular:  Negative for chest pain.     Objective:   Vitals:   03/18/24 1539  BP: (!) 136/92  Pulse: 99  Temp: 98.7 F (37.1 C)  SpO2: 98%  Weight: 233 lb (105.7 kg)  Height: 5' 7 (1.702 m)   98% on RA  BMI Readings from Last 3 Encounters:  03/18/24 36.49 kg/m  01/18/24 34.93 kg/m  12/26/23 35.16 kg/m   Wt Readings from Last 3 Encounters:  03/18/24 233 lb (105.7 kg)  01/18/24 223 lb (101.2 kg)  12/26/23 224 lb 8 oz (101.8 kg)     Physical Exam Constitutional:      Appearance: Normal appearance. She is obese.  Cardiovascular:     Rate and Rhythm: Normal rate and regular rhythm.     Pulses: Normal pulses.     Heart sounds: Normal heart sounds.  Pulmonary:     Effort: Pulmonary effort is normal.     Breath sounds: Normal breath sounds.  Neurological:     General:  No focal deficit present.     Mental Status: She is alert and oriented to person, place, and time. Mental status is at baseline.       Ancillary Information    Past Medical History:  Diagnosis Date   ADD (attention deficit disorder)    Arthritis 04/24/2008   Diffuse cystic mastopathy 04/24/2010   LEFT    Hypertension    Hypothyroidism    Mastitis      Family History  Problem Relation Age of Onset   Hypertension Mother    Throat cancer Paternal Aunt 27   Diabetes Paternal Grandmother      Past Surgical History:  Procedure Laterality Date   ARTERIAL LINE INSERTION N/A  10/20/2023   Procedure: ARTERIAL LINE INSERTION;  Surgeon: Gardenia Led, DO;  Location: [facility] INVASIVE CV LAB;  Service: Cardiovascular;  Laterality: N/A;   ARTERIAL LINE INSERTION N/A 10/20/2023   Procedure: ARTERIAL LINE INSERTION;  Surgeon: Wonda Sharper, MD;  Location: [facility] INVASIVE CV LAB;  Service: Cardiovascular;  Laterality: N/A;   BREAST MASS EXCISION Left 2012   COLONOSCOPY  2005   [place] ? MD    ECMO CANNULATION N/A 10/20/2023   Procedure: ECMO CANNULATION;  Surgeon: Gardenia Led, DO;  Location: [facility] INVASIVE CV LAB;  Service: Cardiovascular;  Laterality: N/A;   INCISE AND DRAIN ABCESS  2010   IR ANGIOGRAM PELVIS SELECTIVE OR SUPRASELECTIVE  10/20/2023   IR ANGIOGRAM PULMONARY BILATERAL SELECTIVE  10/20/2023   IR ANGIOGRAM SELECTIVE EACH ADDITIONAL VESSEL  10/20/2023   IR ANGIOGRAM SELECTIVE EACH ADDITIONAL VESSEL  10/20/2023   IR ANGIOGRAM SELECTIVE EACH ADDITIONAL VESSEL  10/20/2023   IR ANGIOGRAM SELECTIVE EACH ADDITIONAL VESSEL  10/20/2023   IR EMBO TUMOR ORGAN ISCHEMIA INFARCT INC GUIDE ROADMAPPING  10/20/2023   IR INFUSION THROMBOL ARTERIAL INITIAL (MS)  10/20/2023   IR INFUSION THROMBOL ARTERIAL INITIAL (MS)  10/20/2023   IR THROMB F/U EVAL ART/VEN FINAL DAY (MS)  10/21/2023   IR US  GUIDE VASC ACCESS RIGHT  10/20/2023   IR US  GUIDE VASC ACCESS RIGHT  10/20/2023   MASTECTOMY, PARTIAL  2013   RIGHT HEART CATH N/A 10/20/2023   Procedure: RIGHT HEART CATH;  Surgeon: Gardenia Led, DO;  Location: [facility] INVASIVE CV LAB;  Service: Cardiovascular;  Laterality: N/A;    Social History   Socioeconomic History   Marital status: Single    Spouse name: Not on file   Number of children: Not on file   Years of education: Not on file   Highest education level: Not on file  Occupational History   Not on file  Tobacco Use   Smoking status: Former    Current packs/day: 1.00    Average packs/day: 1 pack/day for 10.0 years (10.0 ttl pk-yrs)    Types: Cigarettes   Smokeless tobacco: Never   Vaping Use   Vaping status: Every Day  Substance and Sexual Activity   Alcohol use: No   Drug use: No   Sexual activity: Not Currently  Other Topics Concern   Not on file  Social History Narrative   Not on file   Social Drivers of Health   Financial Resource Strain: Not on file  Food Insecurity: No Food Insecurity (12/26/2023)   Hunger Vital Sign    Worried About Running Out of Food in the Last Year: Never true    Ran Out of Food in the Last Year: Never true  Recent Concern: Food Insecurity - Food Insecurity Present (10/20/2023)   Hunger Vital Sign  Worried About Programme Researcher, Broadcasting/film/video in the Last Year: Sometimes true    [employer] of Food in the Last Year: Never true  Transportation Needs: No Transportation Needs (12/26/2023)   PRAPARE - Administrator, Civil Service (Medical): No    Lack of Transportation (Non-Medical): No  Recent Concern: Transportation Needs - Unmet Transportation Needs (10/20/2023)   PRAPARE - Administrator, Civil Service (Medical): Yes    Lack of Transportation (Non-Medical): No  Physical Activity: Not on file  Stress: Not on file  Social Connections: Unknown (10/20/2023)   Social Connection and Isolation Panel    Frequency of Communication with Friends and Family: More than three times a week    Frequency of Social Gatherings with Friends and Family: Three times a week    Attends Religious Services: Patient declined    Active Member of Clubs or Organizations: Patient declined    Attends Banker Meetings: Patient declined    Marital Status: Patient declined  Intimate Partner Violence: Not At Risk (12/26/2023)   Humiliation, Afraid, Rape, and Kick questionnaire    Fear of Current or Ex-Partner: No    Emotionally Abused: No    Physically Abused: No    Sexually Abused: No     Allergies  Allergen Reactions   Wellbutrin [Bupropion] Hives, Itching and Other (See Comments)    Skin crawling sensation   Heparin      HIT      CBC    Component Value Date/Time   WBC 9.5 11/09/2023 0513   RBC 3.92 11/09/2023 0513   HGB 10.4 (L) 11/09/2023 0513   HGB 12.9 05/19/2012 0947   HCT 33.8 (L) 11/09/2023 0513   HCT 37.4 05/19/2012 0947   PLT 719 (H) 11/09/2023 0513   PLT 294 05/19/2012 0947   MCV 86.2 11/09/2023 0513   MCV 91 05/19/2012 0947   MCH 26.5 11/09/2023 0513   MCHC 30.8 11/09/2023 0513   RDW 18.8 (H) 11/09/2023 0513   RDW 14.7 (H) 05/19/2012 0947   LYMPHSABS 1.5 11/09/2023 0513   LYMPHSABS 2.8 05/19/2012 0947   MONOABS 0.7 11/09/2023 0513   MONOABS 0.8 05/19/2012 0947   EOSABS 0.3 11/09/2023 0513   EOSABS 0.3 05/19/2012 0947   BASOSABS 0.0 11/09/2023 0513   BASOSABS 0.1 05/19/2012 0947    Pulmonary Functions Testing Results:     No data to display          Outpatient Medications Prior to Visit  Medication Sig Dispense Refill   apixaban  (ELIQUIS ) 5 MG TABS tablet Take 1 tablet (5 mg total) by mouth 2 (two) times daily. 60 tablet 1   feeding supplement, GLUCERNA SHAKE, (GLUCERNA SHAKE) LIQD Take 237 mLs by mouth 2 (two) times daily between meals. 7110 mL 2   gabapentin (NEURONTIN) 300 MG capsule GABAPENTIN 300 MG CAPS     levothyroxine  (SYNTHROID ) 88 MCG tablet Take 88 mcg by mouth daily before breakfast.     losartan (COZAAR) 50 MG tablet Take 50 mg by mouth daily.     megestrol  (MEGACE ) 40 MG tablet Take 1 tablet (40 mg total) by mouth daily. 30 tablet 0   methylphenidate (RITALIN) 10 MG tablet Take 20 mg by mouth 2 (two) times daily.     norethindrone (AYGESTIN) 5 MG tablet Take 5 mg by mouth daily.     amLODipine (NORVASC) 5 MG tablet Take 5 mg by mouth daily.     DULoxetine (CYMBALTA) 30 MG capsule Take 30 mg  by mouth as needed.     Ferrous Gluconate (KP FERROUS GLUCONATE) 324 (37.5 Fe) MG TABS Take 1 tablet by mouth daily.     Multiple Vitamin (MULTIVITAMIN WITH MINERALS) TABS tablet Take 1 tablet by mouth daily. 30 tablet 0   polyethylene glycol (MIRALAX  / GLYCOLAX ) 17 g packet Take  17 g by mouth daily as needed for moderate constipation. 14 each 0   SM VITAMIN D3 50 MCG (2000 UT) CAPS Take 2,000 Units by mouth daily.     No facility-administered medications prior to visit.

## 2024-04-29 ENCOUNTER — Ambulatory Visit: Admission: RE | Admit: 2024-04-29 | Payer: Self-pay

## 2024-05-05 ENCOUNTER — Ambulatory Visit
Admission: RE | Admit: 2024-05-05 | Discharge: 2024-05-05 | Disposition: A | Payer: Self-pay | Source: Ambulatory Visit | Attending: Student in an Organized Health Care Education/Training Program

## 2024-05-05 DIAGNOSIS — I2699 Other pulmonary embolism without acute cor pulmonale: Secondary | ICD-10-CM | POA: Insufficient documentation

## 2024-05-05 MED ORDER — TECHNETIUM TO 99M ALBUMIN AGGREGATED
3.9300 | Freq: Once | INTRAVENOUS | Status: AC | PRN
  Administered 2024-05-05: 3.93 via INTRAVENOUS

## 2024-05-07 ENCOUNTER — Encounter: Payer: Self-pay | Admitting: Student in an Organized Health Care Education/Training Program

## 2024-06-04 ENCOUNTER — Other Ambulatory Visit: Payer: Self-pay

## 2024-06-26 ENCOUNTER — Ambulatory Visit: Payer: Self-pay | Admitting: Student in an Organized Health Care Education/Training Program

## 2024-07-03 ENCOUNTER — Ambulatory Visit: Payer: Self-pay | Admitting: Student in an Organized Health Care Education/Training Program
